# Patient Record
Sex: Female | Born: 1960 | Race: Black or African American | Hispanic: No | Marital: Married | Smoking: Never smoker
Health system: Southern US, Community
[De-identification: ages and names within clinical notes are randomized; demographics above are authoritative.]

## PROBLEM LIST (undated history)

## (undated) DIAGNOSIS — Z95810 Presence of automatic (implantable) cardiac defibrillator: Secondary | ICD-10-CM

## (undated) DIAGNOSIS — I442 Atrioventricular block, complete: Secondary | ICD-10-CM

## (undated) DIAGNOSIS — I428 Other cardiomyopathies: Secondary | ICD-10-CM

## (undated) DIAGNOSIS — K635 Polyp of colon: Secondary | ICD-10-CM

## (undated) DIAGNOSIS — N83202 Unspecified ovarian cyst, left side: Secondary | ICD-10-CM

## (undated) DIAGNOSIS — D251 Intramural leiomyoma of uterus: Secondary | ICD-10-CM

## (undated) DIAGNOSIS — N83201 Unspecified ovarian cyst, right side: Secondary | ICD-10-CM

## (undated) DIAGNOSIS — I5022 Chronic systolic (congestive) heart failure: Secondary | ICD-10-CM

## (undated) DIAGNOSIS — I10 Essential (primary) hypertension: Secondary | ICD-10-CM

## (undated) HISTORY — DX: Polyp of colon: K63.5

## (undated) HISTORY — DX: Unspecified ovarian cyst, right side: N83.201

## (undated) HISTORY — DX: Intramural leiomyoma of uterus: D25.1

## (undated) HISTORY — DX: Essential (primary) hypertension: I10

## (undated) HISTORY — DX: Unspecified ovarian cyst, left side: N83.202

---

## 1996-03-09 HISTORY — PX: MYOMECTOMY: SHX85

## 2002-03-09 HISTORY — PX: ENDOMETRIAL ABLATION: SHX621

## 2003-04-03 ENCOUNTER — Other Ambulatory Visit: Admission: RE | Admit: 2003-04-03 | Discharge: 2003-04-03 | Payer: Self-pay | Admitting: Family Medicine

## 2003-07-17 ENCOUNTER — Encounter: Admission: RE | Admit: 2003-07-17 | Discharge: 2003-07-17 | Payer: Self-pay | Admitting: Family Medicine

## 2004-07-16 ENCOUNTER — Ambulatory Visit (HOSPITAL_COMMUNITY): Admission: RE | Admit: 2004-07-16 | Discharge: 2004-07-16 | Payer: Self-pay | Admitting: Family Medicine

## 2005-04-21 ENCOUNTER — Other Ambulatory Visit: Admission: RE | Admit: 2005-04-21 | Discharge: 2005-04-21 | Payer: Self-pay | Admitting: Family Medicine

## 2005-07-27 ENCOUNTER — Ambulatory Visit (HOSPITAL_COMMUNITY): Admission: RE | Admit: 2005-07-27 | Discharge: 2005-07-27 | Payer: Self-pay | Admitting: Family Medicine

## 2006-05-20 ENCOUNTER — Other Ambulatory Visit: Admission: RE | Admit: 2006-05-20 | Discharge: 2006-05-20 | Payer: Self-pay | Admitting: Family Medicine

## 2006-11-23 ENCOUNTER — Ambulatory Visit (HOSPITAL_COMMUNITY): Admission: RE | Admit: 2006-11-23 | Discharge: 2006-11-23 | Payer: Self-pay | Admitting: Family Medicine

## 2007-07-11 ENCOUNTER — Other Ambulatory Visit: Admission: RE | Admit: 2007-07-11 | Discharge: 2007-07-11 | Payer: Self-pay | Admitting: Family Medicine

## 2007-12-08 ENCOUNTER — Encounter: Admission: RE | Admit: 2007-12-08 | Discharge: 2007-12-08 | Payer: Self-pay | Admitting: Family Medicine

## 2007-12-22 ENCOUNTER — Encounter: Admission: RE | Admit: 2007-12-22 | Discharge: 2007-12-22 | Payer: Self-pay | Admitting: Family Medicine

## 2008-03-09 HISTORY — PX: COLONOSCOPY: SHX174

## 2008-12-31 ENCOUNTER — Other Ambulatory Visit: Admission: RE | Admit: 2008-12-31 | Discharge: 2008-12-31 | Payer: Self-pay | Admitting: Family Medicine

## 2009-01-28 ENCOUNTER — Encounter: Payer: Self-pay | Admitting: Family Medicine

## 2009-05-01 ENCOUNTER — Ambulatory Visit: Payer: Self-pay | Admitting: Family Medicine

## 2009-05-01 DIAGNOSIS — N83209 Unspecified ovarian cyst, unspecified side: Secondary | ICD-10-CM | POA: Insufficient documentation

## 2009-05-01 DIAGNOSIS — I10 Essential (primary) hypertension: Secondary | ICD-10-CM | POA: Insufficient documentation

## 2009-05-01 DIAGNOSIS — D649 Anemia, unspecified: Secondary | ICD-10-CM | POA: Insufficient documentation

## 2009-05-01 DIAGNOSIS — L910 Hypertrophic scar: Secondary | ICD-10-CM | POA: Insufficient documentation

## 2009-05-20 ENCOUNTER — Ambulatory Visit: Payer: Self-pay | Admitting: Family Medicine

## 2009-05-20 DIAGNOSIS — M79609 Pain in unspecified limb: Secondary | ICD-10-CM | POA: Insufficient documentation

## 2009-11-20 ENCOUNTER — Ambulatory Visit: Payer: Self-pay | Admitting: Family Medicine

## 2009-11-20 ENCOUNTER — Telehealth: Payer: Self-pay | Admitting: Family Medicine

## 2009-11-20 DIAGNOSIS — M7502 Adhesive capsulitis of left shoulder: Secondary | ICD-10-CM | POA: Insufficient documentation

## 2010-01-03 ENCOUNTER — Ambulatory Visit: Payer: Self-pay | Admitting: Family Medicine

## 2010-01-03 DIAGNOSIS — IMO0002 Reserved for concepts with insufficient information to code with codable children: Secondary | ICD-10-CM | POA: Insufficient documentation

## 2010-03-20 ENCOUNTER — Telehealth: Payer: Self-pay | Admitting: Family Medicine

## 2010-03-29 ENCOUNTER — Encounter: Payer: Self-pay | Admitting: Family Medicine

## 2010-03-30 ENCOUNTER — Encounter: Payer: Self-pay | Admitting: Family Medicine

## 2010-04-08 NOTE — Assessment & Plan Note (Signed)
Summary: toe pain/dm   Vital Signs:  Patient profile:   50 year old female Weight:      154 pounds O2 Sat:      97 % Temp:     98.5 degrees F Pulse rate:   57 / minute BP sitting:   130 / 84  (left arm) Cuff size:   regular  Vitals Entered By: Pura Spice, RN (November 20, 2009 11:45 AM) CC: hit rt 4 th toe last nite.   History of Present Illness: Here for an injury to the right 4th toe which occurred at home last night. She struck the toe ona bed post. Today it throbs and is swollen. She did go in to work. She has done nothing for it so far.   Allergies (verified): No Known Drug Allergies  Past History:  Past Medical History: Reviewed history from 05/01/2009 and no changes required. Anemia-NOS Colonic polyps, hx of Hypertension Chickenpox uterine fibroids ovarian cysts, sees Dr. Maxie Better normal DEXA 2008  Past Surgical History: Reviewed history from 05/01/2009 and no changes required. uterine myomectomy 1998 endometrial ablation 2004 colonoscopy 2010, benign polyps  Review of Systems  The patient denies anorexia, fever, weight loss, weight gain, vision loss, decreased hearing, hoarseness, chest pain, syncope, dyspnea on exertion, peripheral edema, prolonged cough, headaches, hemoptysis, abdominal pain, melena, hematochezia, severe indigestion/heartburn, hematuria, incontinence, genital sores, muscle weakness, suspicious skin lesions, transient blindness, difficulty walking, depression, unusual weight change, abnormal bleeding, enlarged lymph nodes, angioedema, breast masses, and testicular masses.    Physical Exam  General:  Well-developed,well-nourished,in no acute distress; alert,appropriate and cooperative throughout examination Extremities:  the right 4th toe is swolen, ecchymotic, and tender. Full ROM. Good alignment. No crepitus.    Impression & Recommendations:  Problem # 1:  FRACTURE, TOE (ICD-826.0)  Complete Medication List: 1)  Ziac  2.5-6.25 Mg Tabs (Bisoprolol-hydrochlorothiazide) .... Once daily 2)  Felodipine 10 Mg Xr24h-tab (Felodipine) .... Once daily 3)  Ferrous Sulfate 325 (65 Fe) Mg Tabs (Ferrous sulfate) .... Two times a day  Other Orders: Radiology Referral (Radiology) T-Toe(s) [phone])  Patient Instructions: 1)  We will send her for Xrays this afternoon. Elevate the foot today and apply ice packs to it. Use Motrin as needed . Buddy wrap it with the 3rd toe for support.   Appended Document: Orders Update    Clinical Lists Changes  Orders: Added new Test order of T-Toe(s) ([id number]) - Signed

## 2010-04-08 NOTE — Progress Notes (Signed)
Summary: xray results  Phone Note Call from Patient Call back at [phone]   Reason for Call: Lab or Test Results Summary of Call: Xray toe.   Initial call taken by: Rudy Jew, RN,  November 20, 2009 4:03 PM  Follow-up for Phone Call        Pt called to check on status of xray results. Follow-up by: Lucy Antigua,  November 20, 2009 4:15 PM  Additional Follow-up for Phone Call Additional follow up Details #1::        pt notifed  Additional Follow-up by: Pura Spice, RN,  November 20, 2009 5:06 PM

## 2010-04-08 NOTE — Assessment & Plan Note (Signed)
Summary: upper leg pain//ccm   Vital Signs:  Patient profile:   50 year old female Weight:      157 pounds O2 Sat:      98 % Temp:     98.8 degrees F Pulse rate:   64 / minute BP sitting:   120 / 84  (left arm) Cuff size:   regular  Vitals Entered By: Pura Spice, RN (January 03, 2010 10:19 AM) CC: states walked in high heel shoes for 2 days back -back c/o pain left groin area into thigh    History of Present Illness: Here for 3 days of sharp severe pains in the left groin area. No recent trauma but she has  been doing [employer]. No abdominal symptoms.   Contraindications/Deferment of Procedures/Staging:    Test/Procedure: FLU VAX    Reason for deferment: patient declined   Allergies (verified): No Known Drug Allergies  Past History:  Past Medical History: Reviewed history from 05/01/2009 and no changes required. Anemia-NOS Colonic polyps, hx of Hypertension Chickenpox uterine fibroids ovarian cysts, sees Dr. Maxie Better normal DEXA 2008  Past Surgical History: Reviewed history from 05/01/2009 and no changes required. uterine myomectomy 1998 endometrial ablation 2004 colonoscopy 2010, benign polyps  Review of Systems  The patient denies anorexia, fever, weight loss, weight gain, vision loss, decreased hearing, hoarseness, chest pain, syncope, dyspnea on exertion, peripheral edema, prolonged cough, headaches, hemoptysis, abdominal pain, melena, hematochezia, severe indigestion/heartburn, hematuria, incontinence, genital sores, muscle weakness, suspicious skin lesions, transient blindness, difficulty walking, depression, unusual weight change, abnormal bleeding, enlarged lymph nodes, angioedema, breast masses, and testicular masses.    Physical Exam  General:  walks with a slight limp Abdomen:  Bowel sounds positive,abdomen soft and non-tender without masses, organomegaly or hernias noted. Msk:  tender over the left anterior groin area, especially the  proximal hip flexors. Full ROM. No masses   Impression & Recommendations:  Problem # 1:  SPRAIN&STRAIN OTHER SPECIFIED SITES HIP&THIGH (ICD-843.8)  Complete Medication List: 1)  Ziac 2.5-6.25 Mg Tabs (Bisoprolol-hydrochlorothiazide) .... Once daily 2)  Felodipine 10 Mg Xr24h-tab (Felodipine) .... Once daily 3)  Ferrous Sulfate 325 (65 Fe) Mg Tabs (Ferrous sulfate) .... Two times a day  Patient Instructions: 1)  this is a hip flexor strain. rest, ice packs, Advil as needed . No workouts for at least 2 weeks. off work today until 03-09-09.    Orders Added: 1)  Est. Patient Level III [91478]

## 2010-04-08 NOTE — Assessment & Plan Note (Signed)
Summary: new pt ov//ccm/pt had a conflict/cjr   Vital Signs:  Patient profile:   50 year old female Height:      64 inches Weight:      155 pounds BMI:     26.70 Temp:     98.1 degrees F oral Pulse rate:   82 / minute BP sitting:   114 / 84  (left arm) Cuff size:   regular  Vitals Entered By: Alfred Levins, CMA (May 01, 2009 1:31 PM) CC: establish   History of Present Illness: 50 yr old female to establish with [place] after transfering from Dr. Maurice Small. She feels well in general but has a few questions. She had a cpx with labs last month, and her Hgb was low as usual. She has been anemic all her life. She thinks it was 10 or so. She was told to take OTC iron pills, but she would prefer to take prescription iron pills.   Preventive Screening-Counseling & Management  Alcohol-Tobacco     Smoking Status: never  Caffeine-Diet-Exercise     Does Patient Exercise: no      Drug Use:  no.    Allergies (verified): No Known Drug Allergies  Past History:  Past Medical History: Anemia-NOS Colonic polyps, hx of Hypertension Chickenpox uterine fibroids ovarian cysts, sees Dr. Maxie Better normal DEXA 2008  Past Surgical History: uterine myomectomy 1998 endometrial ablation 2004 colonoscopy 2010, benign polyps  Family History: Reviewed history and no changes required. Family History of Arthritis Family History of CAD Female 1st degree relative <50 Family History Depression Family History Diabetes 1st degree relative Family History Hypertension Family History of Stroke M 1st degree relative <50  Social History: Reviewed history and no changes required. Married Never Smoked Alcohol use-yes Drug use-no Regular exercise-no Smoking Status:  never Drug Use:  no Does Patient Exercise:  no  Review of Systems  The patient denies anorexia, fever, weight loss, weight gain, vision loss, decreased hearing, hoarseness, chest pain, syncope, dyspnea on exertion,  peripheral edema, prolonged cough, headaches, hemoptysis, abdominal pain, melena, hematochezia, severe indigestion/heartburn, hematuria, incontinence, genital sores, muscle weakness, suspicious skin lesions, transient blindness, difficulty walking, depression, unusual weight change, abnormal bleeding, enlarged lymph nodes, angioedema, breast masses, and testicular masses.    Physical Exam  General:  overweight-appearing.   Neck:  No deformities, masses, or tenderness noted. Lungs:  Normal respiratory effort, chest expands symmetrically. Lungs are clear to auscultation, no crackles or wheezes. Heart:  Normal rate and regular rhythm. S1 and S2 normal without gallop, click, rub or other extra sounds. She has a soft 2/6 SM loudest in the aortic area. Extremities:  no edema   Impression & Recommendations:  Problem # 1:  HYPERTENSION (ICD-401.9)  The following medications were removed from the medication list:    Ziac 10-6.25 Mg Tabs (Bisoprolol-hydrochlorothiazide) .Marland Kitchen... 1 by mouth once daily Her updated medication list for this problem includes:    Ziac 2.5-6.25 Mg Tabs (Bisoprolol-hydrochlorothiazide) ..... Once daily    Felodipine 10 Mg Xr24h-tab (Felodipine) ..... Once daily  Problem # 2:  ANEMIA-NOS (ICD-285.9)  Her updated medication list for this problem includes:    Ferrous Sulfate 325 (65 Fe) Mg Tabs (Ferrous sulfate) .Marland Kitchen..Marland Kitchen Two times a day  Problem # 3:  COLONIC POLYPS, HX OF (ICD-V12.72)  Problem # 4:  OVARIAN CYST (ICD-620.2)  Complete Medication List: 1)  Ziac 2.5-6.25 Mg Tabs (Bisoprolol-hydrochlorothiazide) .... Once daily 2)  Felodipine 10 Mg Xr24h-tab (Felodipine) .... Once daily 3)  Ferrous Sulfate  325 (65 Fe) Mg Tabs (Ferrous sulfate) .... Two times a day  Patient Instructions: 1)  I wrote to get back on prescription iron pills daily. Get old records Prescriptions: FERROUS SULFATE 325 (65 FE) MG TABS (FERROUS SULFATE) two times a day  #60 x 11   Entered and  Authorized by:   Nelwyn Salisbury MD   Signed by:   Nelwyn Salisbury MD on 05/01/2009   Method used:   [facility]        [facility] [address] [phone]* (retail)       [address]       [place], [place]  [address]       Ph: [phone] or [phone]       Fax: [phone]   RxID:   [id number]

## 2010-04-08 NOTE — Assessment & Plan Note (Signed)
Summary: consult re toe issues//ccm/pt rsc/cjr   Vital Signs:  Patient profile:   50 year old female Weight:      154 pounds BMI:     26.53 Temp:     97.8 degrees F oral BP sitting:   140 / 90  (left arm) Cuff size:   regular  Vitals Entered By: Raechel Ache, RN (May 20, 2009 9:07 AM) CC: C/o sore L pinky toe; feels better today.   History of Present Illness: here for recurrent pains in both 5th toes. This has been going on for about 2 years. The toes never swell or get warm, but they may get red at times. No other toes are involved, and her feet do not bother her. She tries to wear broad toed shoes to avoid constricting her toes. She never takes any meds for them. Around her house she either goes barefoot or wears slippers. The toes hurt all last week, but today they are back to normal.   Allergies: No Known Drug Allergies  Past History:  Past Medical History: Reviewed history from 05/01/2009 and no changes required. Anemia-NOS Colonic polyps, hx of Hypertension Chickenpox uterine fibroids ovarian cysts, sees Dr. Maxie Better normal DEXA 2008  Past Surgical History: Reviewed history from 05/01/2009 and no changes required. uterine myomectomy 1998 endometrial ablation 2004 colonoscopy 2010, benign polyps  Review of Systems  The patient denies anorexia, fever, weight loss, weight gain, vision loss, decreased hearing, hoarseness, chest pain, syncope, dyspnea on exertion, peripheral edema, prolonged cough, headaches, hemoptysis, abdominal pain, melena, hematochezia, severe indigestion/heartburn, hematuria, incontinence, genital sores, muscle weakness, suspicious skin lesions, transient blindness, difficulty walking, depression, unusual weight change, abnormal bleeding, enlarged lymph nodes, angioedema, breast masses, and testicular masses.    Physical Exam  General:  Well-developed,well-nourished,in no acute distress; alert,appropriate and cooperative throughout  examination Extremities:  both feet and all 10 toes are normal, no tenderness or redness at all   Impression & Recommendations:  Problem # 1:  TOE PAIN (ICD-729.5)  Complete Medication List: 1)  Ziac 2.5-6.25 Mg Tabs (Bisoprolol-hydrochlorothiazide) .... Once daily 2)  Felodipine 10 Mg Xr24h-tab (Felodipine) .... Once daily 3)  Ferrous Sulfate 325 (65 Fe) Mg Tabs (Ferrous sulfate) .... Two times a day  Patient Instructions: 1)  Since her exam is normal today, it is difficult to say exactly why the toes hurt at  times. i imagine that wearing tight or pointy toed shoes would contribute to this problem. Advised her to wear wide toed shoes, use Advil or Aleeve as needed , and see me next time when they are hurting so I can get a better idea of what the cause is.

## 2010-04-08 NOTE — Letter (Signed)
Summary: Records from [facility] 2005 - 2010  Records from [facility] 2005 - 2010   Imported By: Maryln Gottron 05/16/2009 14:47:24  _____________________________________________________________________  External Attachment:    Type:   Image     Comment:   External Document

## 2010-04-10 NOTE — Progress Notes (Signed)
Summary: refill  Phone Note Call from Patient Call back at [phone]   Caller: Patient--live call Summary of Call: plendil and ziac [facility]. Initial call taken by: Warnell Forester,  March 20, 2010 2:52 PM  Follow-up for Phone Call        call in one year of each  Follow-up by: Nelwyn Salisbury MD,  March 21, 2010 10:49 AM  Additional Follow-up for Phone Call Additional follow up Details #1::        done  Additional Follow-up by: Pura Spice, RN,  March 21, 2010 11:37 AM    Prescriptions: FELODIPINE 10 MG XR24H-TAB (FELODIPINE) once daily  #30 x 11   Entered by:   Pura Spice, RN   Authorized by:   Nelwyn Salisbury MD   Signed by:   Pura Spice, RN on 03/21/2010   Method used:   [facility]        [facility] [address] [phone]* (retail)       [address]       [place], [place]  [address]       Ph: [phone] or [phone]       Fax: [phone]   RxID:   [id number] ZIAC 2.5-6.25 MG TABS (BISOPROLOL-HYDROCHLOROTHIAZIDE) once daily  #30 x 11   Entered by:   Pura Spice, RN   Authorized by:   Nelwyn Salisbury MD   Signed by:   Pura Spice, RN on 03/21/2010   Method used:   [facility]        [facility] [address] [phone]* (retail)       [address]       [place], [place]  [address]       Ph: [phone] or [phone]       Fax: [phone]   RxID:   [id number]

## 2010-05-29 ENCOUNTER — Ambulatory Visit: Payer: Self-pay | Admitting: Family Medicine

## 2010-06-02 ENCOUNTER — Ambulatory Visit: Payer: Self-pay | Admitting: Family Medicine

## 2010-06-06 ENCOUNTER — Encounter: Payer: Self-pay | Admitting: Family Medicine

## 2010-06-06 ENCOUNTER — Ambulatory Visit (INDEPENDENT_AMBULATORY_CARE_PROVIDER_SITE_OTHER): Payer: BC Managed Care – PPO | Admitting: Family Medicine

## 2010-06-06 VITALS — BP 112/78 | HR 70 | Temp 98.4°F | Wt 152.0 lb

## 2010-06-06 DIAGNOSIS — M549 Dorsalgia, unspecified: Secondary | ICD-10-CM

## 2010-06-06 LAB — POCT URINALYSIS DIPSTICK
Bilirubin, UA: NEGATIVE
Ketones, UA: NEGATIVE
Leukocytes, UA: NEGATIVE
Nitrite, UA: NEGATIVE
Protein, UA: NEGATIVE
Spec Grav, UA: 1.02

## 2010-06-06 MED ORDER — PREDNISONE (PAK) 10 MG PO TABS: ORAL_TABLET | ORAL | Status: DC

## 2010-06-06 NOTE — Progress Notes (Signed)
  Subjective:    Patient ID: Joanne Bell, female    DOB: 07-25-1960, 50 y.o.   MRN: [id number]  HPI Here to follow up on a lower back strain which occurred about 2 weeks ago, apparently from working in her garden. She has stiffness and pain in the left lower back. No pain in the legs. Using heat. She went to Urgent Care last weekend and had normal lumbar Xrays. She was given Vicodin and Flexeril, but she has not improved much this week. No urinary symptoms.   Review of Systems  Constitutional: Negative.   Gastrointestinal: Negative.   Genitourinary: Negative.   Musculoskeletal: Positive for back pain.       Objective:   Physical Exam  Constitutional: She appears well-developed and well-nourished.  Abdominal: Soft. Bowel sounds are normal. There is no tenderness.  Musculoskeletal:       Mildly tender along the left lower back with mild spasms. Full ROM and negative SLR          Assessment & Plan:  I think the one thing missing is something to reduce the inflammation of the soft tissues of the back. Add a steroid taper pack.

## 2010-06-09 ENCOUNTER — Telehealth: Payer: Self-pay | Admitting: *Deleted

## 2010-06-09 NOTE — Telephone Encounter (Addendum)
Duplicate mess .  Per pt request note with today date faxed and informed pt per dr fry recommendations diagnosis was not put on the note.

## 2010-06-09 NOTE — Telephone Encounter (Signed)
We can change the date on the note, but I strongly suggest she does NOT put a diagnosis on it

## 2010-06-09 NOTE — Telephone Encounter (Signed)
Note to return to work was wrong.  Should be today, and should diagnosis on it, please.  Fax to [phone]

## 2010-06-26 ENCOUNTER — Encounter: Payer: Self-pay | Admitting: Family Medicine

## 2010-06-26 ENCOUNTER — Ambulatory Visit (INDEPENDENT_AMBULATORY_CARE_PROVIDER_SITE_OTHER): Payer: BC Managed Care – PPO | Admitting: Family Medicine

## 2010-06-26 VITALS — BP 120/80 | HR 89 | Wt 154.0 lb

## 2010-06-26 DIAGNOSIS — S335XXA Sprain of ligaments of lumbar spine, initial encounter: Secondary | ICD-10-CM

## 2010-06-26 DIAGNOSIS — S39012A Strain of muscle, fascia and tendon of lower back, initial encounter: Secondary | ICD-10-CM

## 2010-06-26 NOTE — Progress Notes (Signed)
  Subjective:    Patient ID: Joanne Bell, female    DOB: 1960-11-08, 50 y.o.   MRN: [id number]  HPI Here to follow up on a lower back strain that started 4 weeks ago. She has  Had normal lumbar Xrays, and she completed a course of Prednisone one week ago. The pain is much better, but she still gets some spasms and stiffness in the left lower back at times. Taking Motrin.    Review of Systems  Constitutional: Negative.   Musculoskeletal: Positive for back pain.       Objective:   Physical Exam  Constitutional: She appears well-developed and well-nourished.  Musculoskeletal:       Mildly tender in the loeft lower back. Full ROM           Assessment & Plan:  Try some PT for several weeks.

## 2010-07-02 ENCOUNTER — Ambulatory Visit: Payer: BC Managed Care – PPO

## 2010-08-06 ENCOUNTER — Encounter: Payer: Self-pay | Admitting: Family Medicine

## 2010-08-06 ENCOUNTER — Ambulatory Visit (INDEPENDENT_AMBULATORY_CARE_PROVIDER_SITE_OTHER): Payer: BC Managed Care – PPO | Admitting: Family Medicine

## 2010-08-06 VITALS — BP 118/72 | HR 77 | Temp 98.5°F | Resp 14 | Ht 64.75 in | Wt 154.0 lb

## 2010-08-06 DIAGNOSIS — Z136 Encounter for screening for cardiovascular disorders: Secondary | ICD-10-CM

## 2010-08-06 DIAGNOSIS — Z0000 Encounter for general adult medical examination without abnormal findings: Secondary | ICD-10-CM

## 2010-08-06 LAB — CBC WITH DIFFERENTIAL/PLATELET
Basophils Relative: 0.3 % (ref 0.0–3.0)
Eosinophils Relative: 0.7 % (ref 0.0–5.0)
HCT: 32.6 % — ABNORMAL LOW (ref 36.0–46.0)
Hemoglobin: 10.9 g/dL — ABNORMAL LOW (ref 12.0–15.0)
MCHC: 33.3 g/dL (ref 30.0–36.0)
Monocytes Relative: 8.4 % (ref 3.0–12.0)
Neutro Abs: 2.3 10*3/uL (ref 1.4–7.7)

## 2010-08-06 LAB — POCT URINALYSIS DIPSTICK
Bilirubin, UA: NEGATIVE
Glucose, UA: NEGATIVE
Leukocytes, UA: NEGATIVE
Nitrite, UA: NEGATIVE
Spec Grav, UA: 1.03

## 2010-08-06 LAB — HEPATIC FUNCTION PANEL: Bilirubin, Direct: 0.1 mg/dL (ref 0.0–0.3)

## 2010-08-06 LAB — LIPID PANEL
Cholesterol: 168 mg/dL (ref 0–200)
LDL Cholesterol: 89 mg/dL (ref 0–99)
Triglycerides: 160 mg/dL — ABNORMAL HIGH (ref 0.0–149.0)

## 2010-08-06 LAB — BASIC METABOLIC PANEL
Calcium: 9.1 mg/dL (ref 8.4–10.5)
GFR: 104.34 mL/min (ref >60.00)
Glucose, Bld: 85 mg/dL (ref 70–99)
Potassium: 4.2 mEq/L (ref 3.5–5.1)

## 2010-08-06 MED ORDER — FERROUS SULFATE 325 (65 FE) MG PO TABS: 325.0000 mg | ORAL_TABLET | Freq: Two times a day (BID) | ORAL | Status: DC

## 2010-08-06 MED ORDER — BISOPROLOL-HYDROCHLOROTHIAZIDE 2.5-6.25 MG PO TABS: 1.0000 | ORAL_TABLET | Freq: Every day | ORAL | Status: DC

## 2010-08-06 MED ORDER — FELODIPINE ER 10 MG PO TB24: 10.0000 mg | ORAL_TABLET | Freq: Every day | ORAL | Status: DC

## 2010-08-06 NOTE — Progress Notes (Signed)
  Subjective:    Patient ID: Joanne Bell, female    DOB: Sep 21, 1960, 50 y.o.   MRN: [id number]  HPI 50 yr old female for a cpx. She feels great with no complaints. Her back pain has resolved.   Review of Systems  Constitutional: Negative.   HENT: Negative.   Eyes: Negative.   Respiratory: Negative.   Cardiovascular: Negative.   Gastrointestinal: Negative.   Genitourinary: Negative for dysuria, urgency, frequency, hematuria, flank pain, decreased urine volume, enuresis, difficulty urinating, pelvic pain and dyspareunia.  Musculoskeletal: Negative.   Skin: Negative.   Neurological: Negative.   Hematological: Negative.   Psychiatric/Behavioral: Negative.        Objective:   Physical Exam  Constitutional: She is oriented to person, place, and time. She appears well-developed and well-nourished. No distress.  HENT:  Head: Normocephalic and atraumatic.  Right Ear: External ear normal.  Left Ear: External ear normal.  Nose: Nose normal.  Mouth/Throat: Oropharynx is clear and moist. No oropharyngeal exudate.  Eyes: Conjunctivae and EOM are normal. Pupils are equal, round, and reactive to light. No scleral icterus.  Neck: Normal range of motion. Neck supple. No JVD present. No thyromegaly present.  Cardiovascular: Normal rate, regular rhythm, normal heart sounds and intact distal pulses.  Exam reveals no gallop and no friction rub.   No murmur heard.      EKG normal   Pulmonary/Chest: Effort normal and breath sounds normal. No respiratory distress. She has no wheezes. She has no rales. She exhibits no tenderness.  Abdominal: Soft. Bowel sounds are normal. She exhibits no distension and no mass. There is no tenderness. There is no rebound and no guarding.  Musculoskeletal: Normal range of motion. She exhibits no edema and no tenderness.  Lymphadenopathy:    She has no cervical adenopathy.  Neurological: She is alert and oriented to person, place, and time. She has normal reflexes.  No cranial nerve deficit. She exhibits normal muscle tone. Coordination normal.  Skin: Skin is warm and dry. No rash noted. No erythema.  Psychiatric: She has a normal mood and affect. Her behavior is normal. Judgment and thought content normal.          Assessment & Plan:  Get fasting labs today

## 2010-08-12 NOTE — Progress Notes (Signed)
Call placed to patient at [phone], no answer. A detailed voice message was left informing patient per Dr Claris Che instructions

## 2010-12-15 ENCOUNTER — Encounter: Payer: Self-pay | Admitting: Family Medicine

## 2010-12-15 ENCOUNTER — Ambulatory Visit (INDEPENDENT_AMBULATORY_CARE_PROVIDER_SITE_OTHER): Payer: BC Managed Care – PPO | Admitting: Family Medicine

## 2010-12-15 VITALS — BP 124/72 | HR 69 | Temp 98.5°F | Wt 149.0 lb

## 2010-12-15 DIAGNOSIS — R0989 Other specified symptoms and signs involving the circulatory and respiratory systems: Secondary | ICD-10-CM

## 2010-12-15 DIAGNOSIS — K589 Irritable bowel syndrome without diarrhea: Secondary | ICD-10-CM

## 2010-12-15 DIAGNOSIS — R0609 Other forms of dyspnea: Secondary | ICD-10-CM

## 2010-12-15 DIAGNOSIS — R0683 Snoring: Secondary | ICD-10-CM

## 2010-12-15 MED ORDER — FLUTICASONE PROPIONATE 50 MCG/ACT NA SUSP: 2.0000 | Freq: Every day | NASAL | Status: DC

## 2010-12-15 MED ORDER — DICYCLOMINE HCL 20 MG PO TABS: 20.0000 mg | ORAL_TABLET | Freq: Three times a day (TID) | ORAL | Status: DC | PRN

## 2010-12-15 NOTE — Progress Notes (Signed)
  Subjective:    Patient ID: Joanne Bell, female    DOB: [birth year], 50 y.o.   MRN: [id number]  HPI Here to discuss intermittent epigastric pains with nausea and diarrhea. These seem to be triggered by certain meals, and sometimes they come in the middle of the night. No heartburn or fever. Avoiding spicy foods and not eating late at night has helped. She uses a lot of Pepto Bismol. Also she has started snoring over the past few months. She also notices a lot of nasal congestion recently.    Review of Systems  Constitutional: Negative.   HENT: Positive for congestion.   Respiratory: Negative.   Cardiovascular: Negative.   Gastrointestinal: Positive for nausea, abdominal pain and diarrhea. Negative for vomiting, constipation, blood in stool, abdominal distention and anal bleeding.       Objective:   Physical Exam  Constitutional: She appears well-developed and well-nourished.  HENT:  Right Ear: External ear normal.  Left Ear: External ear normal.  Nose: Nose normal.  Neck: No thyromegaly present.  Abdominal: Soft. Bowel sounds are normal. She exhibits no distension and no mass. There is no tenderness. There is no rebound and no guarding.  Lymphadenopathy:    She has no cervical adenopathy.          Assessment & Plan:  Recheck prn

## 2011-02-10 ENCOUNTER — Telehealth: Payer: Self-pay | Admitting: Family Medicine

## 2011-02-10 NOTE — Telephone Encounter (Signed)
Pt states this is a new occurrence. When pt walks a short distance or up a few steps her chest tightens and she has to sit down and get a breath.  Pt has noticed off and on for the last 2 months.  According to pt today and yesterday it has been all day.  Pt aware of appt on 02/11/11 at 8:45 am.

## 2011-02-10 NOTE — Telephone Encounter (Signed)
Pt called and said that she has been having breathing diff while walking for the last few wks,which has worsened over the last few days. Pt is req call back from nurse.

## 2011-02-11 ENCOUNTER — Encounter: Payer: Self-pay | Admitting: Family Medicine

## 2011-02-11 ENCOUNTER — Ambulatory Visit (INDEPENDENT_AMBULATORY_CARE_PROVIDER_SITE_OTHER): Payer: BC Managed Care – PPO | Admitting: Family Medicine

## 2011-02-11 VITALS — BP 128/72 | HR 43 | Temp 98.6°F | Wt 152.0 lb

## 2011-02-11 DIAGNOSIS — R0602 Shortness of breath: Secondary | ICD-10-CM

## 2011-02-11 DIAGNOSIS — D649 Anemia, unspecified: Secondary | ICD-10-CM

## 2011-02-11 DIAGNOSIS — I498 Other specified cardiac arrhythmias: Secondary | ICD-10-CM

## 2011-02-11 DIAGNOSIS — R001 Bradycardia, unspecified: Secondary | ICD-10-CM

## 2011-02-11 LAB — POCT HEMOGLOBIN: Hemoglobin: 11.4

## 2011-02-11 MED ORDER — LOSARTAN POTASSIUM-HCTZ 100-25 MG PO TABS: 1.0000 | ORAL_TABLET | Freq: Every day | ORAL | Status: DC

## 2011-02-11 NOTE — Progress Notes (Signed)
  Subjective:    Patient ID: Joanne Bell, female    DOB: [birth year], 50 y.o.   MRN: [id number]  HPI Here for several months of SOB which she gets with minimal exertion. Now simply walking one hundred yards or going up a flight of steps causes her to have to stop to catch her breath. She often gets an achy pain in her thighs when this happens, and this quickly resolves. No chest pain or nausea or sweats. At her cpx in may of this year her EKG was normal except for sinus bradycardia in the 50s. She has chronic anemia, and her Hgb then was 10.9.     Review of Systems  Constitutional: Negative.   Respiratory: Positive for chest tightness and shortness of breath. Negative for cough and choking.   Cardiovascular: Negative.        Objective:   Physical Exam  Constitutional: She appears well-developed and well-nourished.  Neck: No thyromegaly present.  Cardiovascular: Regular rhythm, normal heart sounds and intact distal pulses.  Exam reveals no gallop and no friction rub.   No murmur heard.      Slow rate. EKG shows sinus bradycardia in the 30s   Pulmonary/Chest: Effort normal and breath sounds normal. No respiratory distress. She has no wheezes. She has no rales. She exhibits no tenderness.  Lymphadenopathy:    She has no cervical adenopathy.          Assessment & Plan:  Her SOB on exertion appears to be due to severe bradycardia. We will refer to Cardiology ASAP and we will take her off her meds containing beta blockers and calcium channel blockers. Switch her BP meds to losartan HCT.

## 2011-02-13 ENCOUNTER — Ambulatory Visit (INDEPENDENT_AMBULATORY_CARE_PROVIDER_SITE_OTHER): Payer: BC Managed Care – PPO | Admitting: Cardiology

## 2011-02-13 ENCOUNTER — Ambulatory Visit (INDEPENDENT_AMBULATORY_CARE_PROVIDER_SITE_OTHER)
Admission: RE | Admit: 2011-02-13 | Discharge: 2011-02-13 | Disposition: A | Discharge status: Home / Self Care | Payer: BC Managed Care – PPO | Source: Ambulatory Visit | Attending: Cardiology | Admitting: Cardiology

## 2011-02-13 ENCOUNTER — Encounter: Payer: Self-pay | Admitting: *Deleted

## 2011-02-13 ENCOUNTER — Encounter: Payer: Self-pay | Admitting: Cardiology

## 2011-02-13 ENCOUNTER — Encounter (HOSPITAL_COMMUNITY): Payer: Self-pay | Admitting: Pharmacy Technician

## 2011-02-13 VITALS — BP 184/82 | HR 36 | Ht 65.0 in | Wt 155.0 lb

## 2011-02-13 DIAGNOSIS — I442 Atrioventricular block, complete: Secondary | ICD-10-CM

## 2011-02-13 DIAGNOSIS — Z01810 Encounter for preprocedural cardiovascular examination: Secondary | ICD-10-CM

## 2011-02-13 LAB — CBC WITH DIFFERENTIAL/PLATELET
Basophils Absolute: 0 10*3/uL (ref 0.0–0.1)
Basophils Relative: 0 % (ref 0–1)
Eosinophils Relative: 1 % (ref 0–5)
HCT: 36 % (ref 36.0–46.0)
Lymphocytes Relative: 52 % — ABNORMAL HIGH (ref 12–46)
MCHC: 31.7 g/dL (ref 30.0–36.0)
Monocytes Absolute: 0.4 10*3/uL (ref 0.1–1.0)
Neutro Abs: 2.3 10*3/uL (ref 1.7–7.7)
Platelets: 325 10*3/uL (ref 150–400)
RDW: 13.7 % (ref 11.5–15.5)
WBC: 5.7 10*3/uL (ref 4.0–10.5)

## 2011-02-13 LAB — MAGNESIUM: Magnesium: 2 mg/dL (ref 1.5–2.5)

## 2011-02-13 LAB — BASIC METABOLIC PANEL
BUN: 12 mg/dL (ref 6–23)
Creat: 0.79 mg/dL (ref 0.50–1.10)
Potassium: 4.2 mEq/L (ref 3.5–5.3)

## 2011-02-13 LAB — TSH: TSH: 3.508 u[IU]/mL (ref 0.350–4.500)

## 2011-02-13 LAB — PROTIME-INR: INR: 0.95 (ref <1.50)

## 2011-02-13 MED ORDER — DIAZEPAM 5 MG PO TABS
5.0000 mg | ORAL_TABLET | Freq: Four times a day (QID) | ORAL | Status: AC | PRN
Start: 2011-02-13 — End: 2011-02-23

## 2011-02-13 NOTE — Assessment & Plan Note (Addendum)
Patient presents with progressive dyspnea on exertion and complete heart block. She had been on a beta blocker but has not taken this in the past 2 days and her heart block persists. This is the most likely cause of her dyspnea. I have reviewed with Dr. Graciela Husbands and we will proceed with pacemaker on Monday. Note she has not had syncope. We will arrange for a cardiac MRI Monday morning to exclude infiltrative cardiomyopathies causing conduction abnormalities. I will also arrange an echocardiogram. We will schedule baseline laboratories to include a TSH. Chest x ray to screen for sarcoid.  I spoke with the patient personally reviewing the history, electrocardiograms, evaluation for underlying primary causes and the likely need for pacemaker implantation. We discussed the potential benefits as well as risks including but not limited to infection perforation of heart lung lead dislodgment and his device malfunction she understands his risks and is willing to proceed.

## 2011-02-13 NOTE — Progress Notes (Signed)
HPI: 50 yo female with no prior cardiac history for evaluation of dyspnea and bradycardia. Patient states that for the past 3 months she has had increased dyspnea on exertion. This has increased in the past several weeks. There is no orthopnea, PND, pedal edema, palpitations, chest pain or syncope. She was seen on December 5 by her primary care physician and her bisoprolol was discontinued. She has continued to have dyspnea on exertion and we were asked to further evaluate.  Current Outpatient Prescriptions  Medication Sig Dispense Refill  . ferrous sulfate 325 (65 FE) MG tablet Take 1 tablet (325 mg total) by mouth 2 (two) times daily.  60 tablet  11  . fluticasone (FLONASE) 50 MCG/ACT nasal spray Place 2 sprays into the nose daily.  16 g  11  . losartan-hydrochlorothiazide (HYZAAR) 100-25 MG per tablet Take 1 tablet by mouth daily.  30 tablet  11  . Multiple Vitamins-Minerals (MULTIVITAMIN,TX-MINERALS) tablet Take 1 tablet by mouth daily.          No Known Allergies  Past Medical History  Diagnosis Date  . Anemia   . Colon polyps   . Hypertension   . Fibroids, intramural   . Bilateral ovarian cysts   . Gynecological examination     sees Dr. Sheronette Cousins    Past Surgical History  Procedure Date  . Myomectomy 1998    per Dr. Cousins  . Endometrial ablation 2004  . Colonoscopy 2010    benign polyps, repeat in 5 yrs    History   Social History  . Marital Status: Single    Spouse Name: N/A    Number of Children: 2  . Years of Education: N/A   Occupational History  .      Secretary   Social History Main Topics  . Smoking status: Never Smoker   . Smokeless tobacco: Never Used  . Alcohol Use: Yes     Rarely  . Drug Use: No  . Sexually Active: Not on file   Other Topics Concern  . Not on file   Social History Narrative  . No narrative on file    Family History  Problem Relation Age of Onset  . Depression Mother   . Arthritis Father   . Diabetes Father     . Hyperlipidemia Father   . Hypertension Father   . Stroke Father     ROS: no fevers or chills, productive cough, hemoptysis, dysphasia, odynophagia, melena, hematochezia, dysuria, hematuria, rash, seizure activity, orthopnea, PND, pedal edema, claudication. Remaining systems are negative.  Physical Exam:   Blood pressure 184/82, pulse 36, height 5' 5" (1.651 m), weight 155 lb (70.308 kg).  General:  Well developed/well nourished in NAD Skin warm/dry Patient not depressed No peripheral clubbing Back-normal HEENT-normal/normal eyelids Neck supple/normal carotid upstroke bilaterally; no bruits; no JVD; no thyromegaly chest - CTA/ normal expansion CV -Bradycardic/normal S1 and S2; no  rubs or gallops;  PMI nondisplaced; 1/6 systolic murmur. 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  

## 2011-02-13 NOTE — Assessment & Plan Note (Signed)
Her blood pressure is elevated but she has not taken her blood pressure patient today. She will begin losartan HCT as prescribed by primary care. We will increase as needed. Avoid AV nodal blocking agents for now.

## 2011-02-13 NOTE — Patient Instructions (Addendum)
Your physician recommends that you schedule a follow-up appointment in: AFTER DEVICE IMPLANT WILL SCHEDULE FROM HOSPITAL Your physician recommends that you continue on your current medications as directed. Please refer to the Current Medication list given to you today. Your physician recommends that you return for lab work in: TODAY STAT BMET CBC  MAG  PT  PTT TSH  DX BRADYCARDIA V72.81 Your physician has requested that you have an echocardiogram. Echocardiography is a painless test that uses sound waves to create images of your heart. It provides your doctor with information about the size and shape of your heart and how well your heart's chambers and valves are working. This procedure takes approximately one hour. There are no restrictions for this procedure.02-16-11 MON AT 10:30 AM AT [address] OFFICE  CARDIAC MRI AT [place]   MON 02-16-11 BE AT RADIOLOGY AT 7:45 AM   A chest x-ray takes a picture of the organs and structures inside the chest, including the heart, lungs, and blood vessels. This test can show several things, including, whether the heart is enlarges; whether fluid is building up in the lungs; and whether pacemaker / defibrillator leads are still in place.TODAY AT [facility] OFFICE   DX V72.81

## 2011-02-13 NOTE — Progress Notes (Signed)
Addended by: Duke Salvia on: 02/13/2011 07:03 PM   Modules accepted: Orders

## 2011-02-15 MED ORDER — DIAZEPAM 5 MG PO TABS
5.0000 mg | ORAL_TABLET | ORAL | Status: AC
  Administered 2011-02-16: 5 mg via ORAL

## 2011-02-15 MED ORDER — CHLORHEXIDINE GLUCONATE 4 % EX LIQD
60.0000 mL | Freq: Once | CUTANEOUS | Status: AC
  Administered 2011-02-16: 4 via TOPICAL
  Filled 2011-02-15: qty 60

## 2011-02-15 MED ORDER — CEFAZOLIN SODIUM 1-5 GM-% IV SOLN
1.0000 g | INTRAVENOUS | Status: AC
  Filled 2011-02-15: qty 50

## 2011-02-15 MED ORDER — SODIUM CHLORIDE 0.9 % IR SOLN
80.0000 mg | Status: AC
  Filled 2011-02-15: qty 2

## 2011-02-16 ENCOUNTER — Encounter (HOSPITAL_COMMUNITY)
Admission: RE | Disposition: A | Discharge status: Home / Self Care | Payer: Self-pay | Source: Ambulatory Visit | Attending: Internal Medicine

## 2011-02-16 ENCOUNTER — Ambulatory Visit (HOSPITAL_COMMUNITY)
Admission: RE | Admit: 2011-02-16 | Discharge: 2011-02-17 | Disposition: A | Discharge status: Home / Self Care | Payer: BC Managed Care – PPO | Source: Ambulatory Visit | Attending: Internal Medicine | Admitting: Internal Medicine

## 2011-02-16 ENCOUNTER — Ambulatory Visit (HOSPITAL_COMMUNITY)
Admission: RE | Admit: 2011-02-16 | Discharge: 2011-02-16 | Disposition: A | Discharge status: Home / Self Care | Payer: BC Managed Care – PPO | Source: Ambulatory Visit | Attending: Cardiology | Admitting: Cardiology

## 2011-02-16 ENCOUNTER — Ambulatory Visit (HOSPITAL_BASED_OUTPATIENT_CLINIC_OR_DEPARTMENT_OTHER): Payer: BC Managed Care – PPO | Admitting: Radiology

## 2011-02-16 DIAGNOSIS — I10 Essential (primary) hypertension: Secondary | ICD-10-CM | POA: Insufficient documentation

## 2011-02-16 DIAGNOSIS — I441 Atrioventricular block, second degree: Secondary | ICD-10-CM | POA: Insufficient documentation

## 2011-02-16 DIAGNOSIS — Z01810 Encounter for preprocedural cardiovascular examination: Secondary | ICD-10-CM

## 2011-02-16 DIAGNOSIS — I442 Atrioventricular block, complete: Secondary | ICD-10-CM

## 2011-02-16 DIAGNOSIS — R0602 Shortness of breath: Secondary | ICD-10-CM

## 2011-02-16 HISTORY — PX: PERMANENT PACEMAKER INSERTION: SHX5480

## 2011-02-16 HISTORY — DX: Atrioventricular block, complete: I44.2

## 2011-02-16 LAB — SURGICAL PCR SCREEN: MRSA, PCR: NEGATIVE

## 2011-02-16 SURGERY — PERMANENT PACEMAKER INSERTION
Anesthesia: LOCAL | Laterality: Left

## 2011-02-16 MED ORDER — DIAZEPAM 5 MG PO TABS
5.0000 mg | ORAL_TABLET | Freq: Four times a day (QID) | ORAL | Status: DC | PRN
Start: 2011-02-16 — End: 2011-02-17
  Filled 2011-02-16: qty 1

## 2011-02-16 MED ORDER — FENTANYL CITRATE 0.05 MG/ML IJ SOLN
INTRAMUSCULAR | Status: AC
  Filled 2011-02-16: qty 2

## 2011-02-16 MED ORDER — LIDOCAINE HCL (PF) 1 % IJ SOLN
INTRAMUSCULAR | Status: AC
  Filled 2011-02-16: qty 60

## 2011-02-16 MED ORDER — FERROUS SULFATE 325 (65 FE) MG PO TABS
325.0000 mg | ORAL_TABLET | Freq: Two times a day (BID) | ORAL | Status: DC
  Administered 2011-02-17: 325 mg via ORAL
  Filled 2011-02-16 (×3): qty 1

## 2011-02-16 MED ORDER — CEFAZOLIN SODIUM 1-5 GM-% IV SOLN
INTRAVENOUS | Status: AC
  Administered 2011-02-16: 1 g via INTRAVENOUS
  Filled 2011-02-16: qty 50

## 2011-02-16 MED ORDER — TRAMADOL HCL 50 MG PO TABS
50.0000 mg | ORAL_TABLET | Freq: Two times a day (BID) | ORAL | Status: DC | PRN
  Administered 2011-02-16: 50 mg via ORAL
  Filled 2011-02-16: qty 1

## 2011-02-16 MED ORDER — HYDRALAZINE HCL 20 MG/ML IJ SOLN
INTRAMUSCULAR | Status: AC
  Filled 2011-02-16: qty 1

## 2011-02-16 MED ORDER — MUPIROCIN 2 % EX OINT
TOPICAL_OINTMENT | CUTANEOUS | Status: AC
  Administered 2011-02-16: 13:00:00
  Filled 2011-02-16: qty 22

## 2011-02-16 MED ORDER — LOSARTAN POTASSIUM 50 MG PO TABS
100.0000 mg | ORAL_TABLET | Freq: Every day | ORAL | Status: DC
  Administered 2011-02-16 – 2011-02-17 (×2): 100 mg via ORAL
  Filled 2011-02-16 (×2): qty 2

## 2011-02-16 MED ORDER — LOSARTAN POTASSIUM-HCTZ 100-25 MG PO TABS: 1.0000 | ORAL_TABLET | Freq: Every day | ORAL | Status: DC

## 2011-02-16 MED ORDER — HYDROCHLOROTHIAZIDE 25 MG PO TABS
25.0000 mg | ORAL_TABLET | Freq: Every day | ORAL | Status: DC
  Administered 2011-02-16 – 2011-02-17 (×2): 25 mg via ORAL
  Filled 2011-02-16 (×2): qty 1

## 2011-02-16 MED ORDER — ONDANSETRON HCL 4 MG/2ML IJ SOLN
4.0000 mg | Freq: Four times a day (QID) | INTRAMUSCULAR | Status: DC | PRN
  Administered 2011-02-16: 4 mg via INTRAVENOUS

## 2011-02-16 MED ORDER — GADOBENATE DIMEGLUMINE 529 MG/ML IV SOLN
20.0000 mL | Freq: Once | INTRAVENOUS | Status: AC
  Administered 2011-02-16: 20 mL via INTRAVENOUS

## 2011-02-16 MED ORDER — SODIUM CHLORIDE 0.9 % IV SOLN
INTRAVENOUS | Status: AC
  Administered 2011-02-16: 20:00:00 via INTRAVENOUS

## 2011-02-16 MED ORDER — CEFAZOLIN SODIUM 1-5 GM-% IV SOLN
1.0000 g | Freq: Four times a day (QID) | INTRAVENOUS | Status: AC
  Administered 2011-02-16 – 2011-02-17 (×3): 1 g via INTRAVENOUS
  Filled 2011-02-16 (×3): qty 50

## 2011-02-16 MED ORDER — SODIUM CHLORIDE 0.9 % IV SOLN
INTRAVENOUS | Status: DC
  Administered 2011-02-16: 13:00:00 via INTRAVENOUS

## 2011-02-16 MED ORDER — SODIUM CHLORIDE 0.9 % IV SOLN: 250.0000 mL | INTRAVENOUS | Status: DC | PRN

## 2011-02-16 MED ORDER — SODIUM CHLORIDE 0.45 % IV SOLN: INTRAVENOUS | Status: DC

## 2011-02-16 MED ORDER — HEPARIN (PORCINE) IN NACL 2-0.9 UNIT/ML-% IJ SOLN
INTRAMUSCULAR | Status: AC
  Filled 2011-02-16: qty 1000

## 2011-02-16 MED ORDER — SODIUM CHLORIDE 0.9 % IJ SOLN
3.0000 mL | Freq: Two times a day (BID) | INTRAMUSCULAR | Status: DC
  Administered 2011-02-17: 3 mL via INTRAVENOUS

## 2011-02-16 MED ORDER — MIDAZOLAM HCL 5 MG/5ML IJ SOLN
INTRAMUSCULAR | Status: AC
  Filled 2011-02-16: qty 5

## 2011-02-16 MED ORDER — SODIUM CHLORIDE 0.9 % IJ SOLN
3.0000 mL | INTRAMUSCULAR | Status: DC | PRN
  Administered 2011-02-17: 3 mL via INTRAVENOUS

## 2011-02-16 MED ORDER — ACETAMINOPHEN 500 MG PO TABS
500.0000 mg | ORAL_TABLET | Freq: Four times a day (QID) | ORAL | Status: DC
  Administered 2011-02-16 – 2011-02-17 (×3): 500 mg via ORAL
  Filled 2011-02-16 (×4): qty 1

## 2011-02-16 NOTE — H&P (View-Only) (Signed)
HPI: 50 yo female with no prior cardiac history for evaluation of dyspnea and bradycardia. Patient states that for the past 3 months she has had increased dyspnea on exertion. This has increased in the past several weeks. There is no orthopnea, PND, pedal edema, palpitations, chest pain or syncope. She was seen on December 5 by her primary care physician and her bisoprolol was discontinued. She has continued to have dyspnea on exertion and we were asked to further evaluate.  Current Outpatient Prescriptions  Medication Sig Dispense Refill  . ferrous sulfate 325 (65 FE) MG tablet Take 1 tablet (325 mg total) by mouth 2 (two) times daily.  60 tablet  11  . fluticasone (FLONASE) 50 MCG/ACT nasal spray Place 2 sprays into the nose daily.  16 g  11  . losartan-hydrochlorothiazide (HYZAAR) 100-25 MG per tablet Take 1 tablet by mouth daily.  30 tablet  11  . Multiple Vitamins-Minerals (MULTIVITAMIN,TX-MINERALS) tablet Take 1 tablet by mouth daily.          No Known Allergies  Past Medical History  Diagnosis Date  . Anemia   . Colon polyps   . Hypertension   . Fibroids, intramural   . Bilateral ovarian cysts   . Gynecological examination     sees Dr. Maxie Better    Past Surgical History  Procedure Date  . Myomectomy 1998    per Dr. Cherly Hensen  . Endometrial ablation 2004  . Colonoscopy 2010    benign polyps, repeat in 5 yrs    History   Social History  . Marital Status: Single    Spouse Name: N/A    Number of Children: 2  . Years of Education: N/A   Occupational History  .      Secretary   Social History Main Topics  . Smoking status: Never Smoker   . Smokeless tobacco: Never Used  . Alcohol Use: Yes     Rarely  . Drug Use: No  . Sexually Active: Not on file   Other Topics Concern  . Not on file   Social History Narrative  . No narrative on file    Family History  Problem Relation Age of Onset  . Depression Mother   . Arthritis Father   . Diabetes Father     . Hyperlipidemia Father   . Hypertension Father   . Stroke Father     ROS: no fevers or chills, productive cough, hemoptysis, dysphasia, odynophagia, melena, hematochezia, dysuria, hematuria, rash, seizure activity, orthopnea, PND, pedal edema, claudication. Remaining systems are negative.  Physical Exam:   Blood pressure 184/82, pulse 36, height 5\' 5"  (1.651 m), weight 155 lb (70.308 kg).  General:  Well developed/well nourished in NAD Skin warm/dry Patient not depressed No peripheral clubbing Back-normal HEENT-normal/normal eyelids Neck supple/normal carotid upstroke bilaterally; no bruits; no JVD; no thyromegaly chest - CTA/ normal expansion CV -Bradycardic/normal S1 and S2; no  rubs or gallops;  PMI nondisplaced; 1/6 systolic murmur. 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

## 2011-02-16 NOTE — Brief Op Note (Signed)
02/16/2011  6:00 PM  PATIENT:  Joanne Bell  50 y.o. 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 Hemostasis was obtained.  After this, we turned our attention to gaining accessm to the extrathoracic,left subclavian vein. This was accomplished without difficulty and without the aspiration of air or puncture of the artery. 2 separate venipunctures were accomplished; guidewires were placed and retained and sequentially 7 French sheath were placed which were passed a Medtronic Revo MRI compatible ventricular lead serial number [id number] V lead and an Medtronic Revo serial number [id number] V atrial lead.  The ventricular lead was manipulated to the right ventricular apex with a bipolar R wave was 12.1, the pacing impedance was 1106, the threshold was 0.7 V at 0.5 ms .  Current at threshold was   1.2 MA and the current of injury was brisk.  The right atrial lead was manipulated to the right atrial appendage  with a bipolar P-wave was 5.6, the pacing impedance was 991, the threshold 0.7 and 0.5.  Current at threshold was 1.2 MA and the current of injury was brisk.  The ventricular lead was marked with a tie prior to the insertion of the atrial lead. The leads were affixed to the prepectoral fascia and attached to a Medtronic REVO  pulse generator  serial number [id number].  Hemostasis was obtained. The pocket was copiously irrigated with antibiotic containing saline  solution. The leads and the pulse generator were placed in the pocket and affixed to the prepectoral fascia. The wound is then closed in 3 layers in normal fashion. The wound is washed dried and a benzoin Steri-Strip this was applied the account sponge counts and instrument counts were correct at the end of the procedure .Marland Kitchen The patient tolerated the procedure without apparent complication.  Gerlene Burdock.D.

## 2011-02-16 NOTE — Interval H&P Note (Signed)
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 After consideration of risks, benefits and other options for treatment, the patient has consented to  Procedure(s): PERMANENT PACEMAKER INSERTION as a surgical intervention .  The patients' history has been reviewed, patient examined, no change in status, stable for surgery.  I have reviewed the patients' chart and labs.  Questions were answered to the patient's satisfaction.    I had seen the patient on Friday and my note was addended to Dr  Waunita Schooner note  Sherryl Manges

## 2011-02-17 ENCOUNTER — Encounter (HOSPITAL_COMMUNITY): Payer: Self-pay | Admitting: Internal Medicine

## 2011-02-17 ENCOUNTER — Ambulatory Visit (HOSPITAL_COMMUNITY): Payer: BC Managed Care – PPO

## 2011-02-17 ENCOUNTER — Other Ambulatory Visit: Payer: Self-pay

## 2011-02-17 DIAGNOSIS — Z950 Presence of cardiac pacemaker: Secondary | ICD-10-CM | POA: Insufficient documentation

## 2011-02-17 DIAGNOSIS — I442 Atrioventricular block, complete: Secondary | ICD-10-CM | POA: Insufficient documentation

## 2011-02-17 MED ORDER — DOXYCYCLINE HYCLATE 100 MG PO TABS: 100.0000 mg | ORAL_TABLET | Freq: Two times a day (BID) | ORAL | Status: AC

## 2011-02-17 MED FILL — Diazepam Tab 5 MG: ORAL | Qty: 1 | Status: AC

## 2011-02-17 NOTE — Progress Notes (Addendum)
Patient Name: Joanne Bell      SUBJECTIVE:some shoulder sorenss no chest pain  Past Medical History  Diagnosis Date  . Atrioventricular block, complete     narrow QRS  . Hypertension   . Fibroids, intramural   . Bilateral ovarian cysts   . Colon polyps   . Pacemaker     Medtronic REVO    PHYSICAL EXAM Filed Vitals:   02/16/11 1225 02/16/11 2143 02/17/11 0553  BP: 110/72 128/78 130/82  Pulse: 37 97 77  Temp: 97.2 F (36.2 C) 98.6 F (37 C) 97.8 F (36.6 C)  TempSrc: Oral Oral Oral  Resp: 18 18 18   Height: 5\' 4"  (1.626 m)    Weight: 155 lb (70.308 kg)    SpO2: 96% 97% 99%    General appearance: alert, cooperative and no distress Lungs: clear to auscultation bilaterally Heart: regular rate and rhythm, S1, S2 normal, no murmur, click, rub or gallop Abdomen: soft, non-tender; bowel sounds normal; no masses,  no organomegaly Extremities: extremities normal, atraumatic, no cyanosis or edema Neurologic: Alert and oriented X 3, normal strength and tone. Normal symmetric reflexes. Normal coordination and gait  TELEMETRY: Reviewed telemetry pt in nsr with p synchronous pacing:    Intake/Output Summary (Last 24 hours) at 02/17/11 0800 Last data filed at 02/16/11 2300  Gross per 24 hour  Intake    172 ml  Output    300 ml  Net   -128 ml    LABS: Basic Metabolic Panel:  Lab 02/13/11 1610  NA 140  K 4.2  CL 103  CO2 22  GLUCOSE 73  BUN 12  CREATININE 0.79  CALCIUM 9.6  MG 2.0  PHOS --   Cardiac Enzymes: No results found for this basename: CKTOTAL:3,CKMB:3,CKMBINDEX:3,TROPONINI:3 in the last 72 hours CBC:  Lab 02/13/11 1542 02/11/11 1003  WBC 5.7 --  NEUTROABS 2.3 --  HGB 11.4* 11.4  HCT 36.0 --  MCV 85.3 --  PLT 325 --     Patient Active Hospital Problem List: HYPERTENSION (05/01/2009)   Assessment: will need followup as periprocedural prssure 200+   Plan: continue benicar/hct Atrioventricular block, complete ()   Assessment: s/p pacer; MRI  and ECHO unrevealing   Plan: lyme titers today; in the event that this is LYME will rx for one month with doxycycline 100 bid X 4 weeks    Signed, Sherryl Manges MD  02/17/2011

## 2011-02-17 NOTE — Progress Notes (Signed)
Pt being discharged home with husband.  Verbalizes understanding all DC instructions.  F/U appointments in place, prescriptions given.

## 2011-02-20 LAB — B. BURGDORFI ANTIBODIES BY WB: B burgdorferi IgM Abs (IB): NEGATIVE

## 2011-02-26 ENCOUNTER — Ambulatory Visit (INDEPENDENT_AMBULATORY_CARE_PROVIDER_SITE_OTHER): Payer: BC Managed Care – PPO | Admitting: *Deleted

## 2011-02-26 ENCOUNTER — Encounter: Payer: Self-pay | Admitting: Internal Medicine

## 2011-02-26 DIAGNOSIS — I442 Atrioventricular block, complete: Secondary | ICD-10-CM

## 2011-02-26 LAB — PACEMAKER DEVICE OBSERVATION
AL AMPLITUDE: 3.2 mv
ATRIAL PACING PM: 14.6
VENTRICULAR PACING PM: 100

## 2011-02-26 NOTE — Progress Notes (Signed)
Wound check-PPM 

## 2011-03-17 ENCOUNTER — Ambulatory Visit (INDEPENDENT_AMBULATORY_CARE_PROVIDER_SITE_OTHER): Payer: BC Managed Care – PPO | Admitting: Family Medicine

## 2011-03-17 ENCOUNTER — Encounter: Payer: Self-pay | Admitting: Family Medicine

## 2011-03-17 VITALS — BP 150/96 | HR 103 | Temp 98.3°F | Wt 152.0 lb

## 2011-03-17 DIAGNOSIS — I442 Atrioventricular block, complete: Secondary | ICD-10-CM

## 2011-03-17 DIAGNOSIS — I10 Essential (primary) hypertension: Secondary | ICD-10-CM

## 2011-03-17 MED ORDER — NEBIVOLOL HCL 5 MG PO TABS: 5.0000 mg | ORAL_TABLET | Freq: Every day | ORAL | Status: DC

## 2011-03-17 NOTE — Progress Notes (Signed)
  Subjective:    Patient ID: Joanne Bell, female    DOB: Feb 24, 1961, 51 y.o.   MRN: [id number]  HPI Here to ask about her BP. 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 She had a dual chamber pacemaker placed and Dr. Graciela Husbands has been monitoring this closely. She has done well with no SOB or chest pain or syncope since then. Her BP has remained a bit high however. She is still on Hyzaar daily.    Review of Systems  Constitutional: Negative.   Respiratory: Negative.   Cardiovascular: Negative.        Objective:   Physical Exam  Constitutional: She appears well-developed and well-nourished.  Neck: No thyromegaly present.  Cardiovascular: Normal rate, regular rhythm, normal heart sounds and intact distal pulses.   Pulmonary/Chest: Effort normal and breath sounds normal.  Lymphadenopathy:    She has no cervical adenopathy.          Assessment & Plan:  Add Bystolic 5 mg daily. Recheck in 3 weeks

## 2011-03-18 ENCOUNTER — Telehealth: Payer: Self-pay | Admitting: Family Medicine

## 2011-03-18 NOTE — Telephone Encounter (Signed)
Pt was in yesterday and received an RX for nebivolol (BYSTOLIC) 5 MG tablet  Pt said that it is to expensive for her to be getting monthly and would like to see if there is a different medication that she would be able to take in place of this that might be cheaper  Please contact pt

## 2011-03-18 NOTE — Telephone Encounter (Signed)
Left voice message.

## 2011-03-18 NOTE — Telephone Encounter (Signed)
I suggest she see her cardiologist about this since we need to be careful about what meds she takes

## 2011-03-19 ENCOUNTER — Telehealth: Payer: Self-pay | Admitting: Cardiology

## 2011-03-19 MED ORDER — METOPROLOL TARTRATE 25 MG PO TABS: 25.0000 mg | ORAL_TABLET | Freq: Two times a day (BID) | ORAL | Status: DC

## 2011-03-19 NOTE — Telephone Encounter (Signed)
Spoke with pt, aware of dr crenshaw's recommendations. 

## 2011-03-19 NOTE — Telephone Encounter (Signed)
Spoke with pt, she is taking the hyzaar. Dr Abran Cantor recently added bystolic and she has not taken it because she could not afford. She would need a generic. Will forward for dr Jens Som review

## 2011-03-19 NOTE — Telephone Encounter (Signed)
Dc bystolic and begin metoprolol 25 mg po BID Joanne Bell

## 2011-03-19 NOTE — Telephone Encounter (Signed)
New msg Pt said since December 8 her bp has been 150-172/93-101 and her BP med is not working. Please call her back

## 2011-04-22 ENCOUNTER — Telehealth: Payer: Self-pay | Admitting: Internal Medicine

## 2011-04-22 NOTE — Telephone Encounter (Signed)
I left a message for the patient to call. 

## 2011-04-22 NOTE — Telephone Encounter (Signed)
New msg Pt called and said her BP 158/98-160/90 and she wants to talk to you about what she can do about head cold. Please call her back

## 2011-04-23 NOTE — Telephone Encounter (Signed)
Mrs Iacovelli has a cough and congestion and wants to know what she can take.  I told her not to take any decongestants.  I told her she can take antihistamines and mucinex and robitussin but to make sure that they are plain and do not contain any decongestants.

## 2011-04-23 NOTE — Telephone Encounter (Signed)
Joanne Bell is concerned about her bp being elevated.  She states it is running 150-170/90-100.  She refuses to follow the recommendations of the on-call physician and states she wants to wait for Dr Graciela Husbands to address her bp issue.

## 2011-04-23 NOTE — Telephone Encounter (Signed)
FU Call: Pt returning call to our office. Please return pt call.  

## 2011-04-27 ENCOUNTER — Ambulatory Visit: Payer: BC Managed Care – PPO | Admitting: Family Medicine

## 2011-04-27 NOTE — Telephone Encounter (Signed)
N/A.  LMTC at both numbers.

## 2011-04-27 NOTE — Telephone Encounter (Signed)
Lets try changing her metoprolol to labetolol 200 bid  Thanks steve

## 2011-04-28 ENCOUNTER — Ambulatory Visit (INDEPENDENT_AMBULATORY_CARE_PROVIDER_SITE_OTHER): Payer: BC Managed Care – PPO | Admitting: Family Medicine

## 2011-04-28 ENCOUNTER — Encounter: Payer: Self-pay | Admitting: Family Medicine

## 2011-04-28 VITALS — BP 120/80 | HR 67 | Temp 98.5°F | Wt 150.0 lb

## 2011-04-28 DIAGNOSIS — J329 Chronic sinusitis, unspecified: Secondary | ICD-10-CM

## 2011-04-28 MED ORDER — AZITHROMYCIN 250 MG PO TABS: ORAL_TABLET | ORAL | Status: AC

## 2011-04-28 NOTE — Progress Notes (Signed)
  Subjective:    Patient ID: Joanne Bell, female    DOB: [birth year], 51 y.o.   MRN: [id number]  HPI Here for 10 days of sinus pressure, PND, ST, and coughing up green sputum. No fever.    Review of Systems  Constitutional: Negative.   HENT: Positive for congestion, postnasal drip and sinus pressure.   Eyes: Negative.   Respiratory: Positive for cough.        Objective:   Physical Exam  Constitutional: She appears well-developed and well-nourished.  HENT:  Right Ear: External ear normal.  Nose: Nose normal.  Mouth/Throat: Oropharynx is clear and moist. No oropharyngeal exudate.  Eyes: Conjunctivae are normal.  Pulmonary/Chest: Effort normal and breath sounds normal.  Lymphadenopathy:    She has no cervical adenopathy.          Assessment & Plan:  Add Mucinex

## 2011-04-28 NOTE — Telephone Encounter (Signed)
I left a message for the patient to call at both contact numbers.

## 2011-04-29 ENCOUNTER — Telehealth: Payer: Self-pay | Admitting: Internal Medicine

## 2011-04-29 NOTE — Telephone Encounter (Signed)
FU Call: Pt returning call to [facility] from yesterday. Please return pt call to discuss further.

## 2011-04-29 NOTE — Telephone Encounter (Signed)
I spoke with the patient about Dr. Odessa Fleming recommendations to d/c metoprolol and start labetolol 200 mg bid. After speaking with the patient, she explained to me that she saw Dr. Clent Ridges yesterday and her bp was good. Her SBP was 120. I advised that she could continue her current meds and track her BP's as she has not done this this week. I reviewed her meds with her. She is listed as taking metoprolol tart 25 mg BID in our system. She explained that she was only taking this once daily. I called her pharmacy to verify what they have been giving her and they have been giving her metoprolol tart 25 mg BID. She is not understanding why she is on the meds she is on. She wanted to know why couldn't she go back on her old meds. I explained that they were d/c'ed prior to her PPM being placed. I reviewed with Dr. Graciela Husbands as her EF is normal. He explained her meds were for BP control. He called the patient and explained to her that her BP could be controlled with many different medications. He advised that she allow Dr. Clent Ridges to follow her BP. She was agreeable with this. No change to her medications were made at this time.

## 2011-05-27 ENCOUNTER — Encounter: Payer: BC Managed Care – PPO | Admitting: Internal Medicine

## 2011-06-01 ENCOUNTER — Ambulatory Visit (INDEPENDENT_AMBULATORY_CARE_PROVIDER_SITE_OTHER): Payer: BC Managed Care – PPO | Admitting: Internal Medicine

## 2011-06-01 ENCOUNTER — Ambulatory Visit: Payer: BC Managed Care – PPO | Admitting: Family Medicine

## 2011-06-01 ENCOUNTER — Encounter: Payer: Self-pay | Admitting: Internal Medicine

## 2011-06-01 VITALS — BP 118/88 | HR 64 | Ht 64.0 in | Wt 152.0 lb

## 2011-06-01 DIAGNOSIS — I10 Essential (primary) hypertension: Secondary | ICD-10-CM

## 2011-06-01 DIAGNOSIS — Z950 Presence of cardiac pacemaker: Secondary | ICD-10-CM

## 2011-06-01 DIAGNOSIS — I442 Atrioventricular block, complete: Secondary | ICD-10-CM

## 2011-06-01 LAB — PACEMAKER DEVICE OBSERVATION
AL AMPLITUDE: 3.4 mv
ATRIAL PACING PM: 6.7
RV LEAD IMPEDENCE PM: 496 Ohm
RV LEAD THRESHOLD: 1 V

## 2011-06-01 LAB — BASIC METABOLIC PANEL
CO2: 29 mEq/L (ref 19–32)
Calcium: 9.6 mg/dL (ref 8.4–10.5)
GFR: 102.43 mL/min (ref >60.00)
Sodium: 140 mEq/L (ref 135–145)

## 2011-06-01 NOTE — Progress Notes (Signed)
  HPI  Joanne Bell is a 50 y.o. female Seen in followup for a pacemaker implanted December 2012 for complete heart block  Her workup for underlying causes was negative including MRI echo Lyme titers and chest x-ray these were all normal.  She co fatigue, but relatively acute.  Past Medical History  Diagnosis Date  . Atrioventricular block, complete     narrow QRS  . Hypertension   . Fibroids, intramural   . Bilateral ovarian cysts   . Colon polyps   . Pacemaker     Medtronic REVO    Past Surgical History  Procedure Date  . Myomectomy 1998    per Dr. Cherly Hensen  . Endometrial ablation 2004  . Colonoscopy 2010    benign polyps, repeat in 5 yrs    Current Outpatient Prescriptions  Medication Sig Dispense Refill  . ferrous sulfate 325 (65 FE) MG tablet Take 1 tablet (325 mg total) by mouth 2 (two) times daily.  60 tablet  11  . losartan-hydrochlorothiazide (HYZAAR) 100-25 MG per tablet Take 1 tablet by mouth daily.  30 tablet  11  . metoprolol tartrate (LOPRESSOR) 25 MG tablet Take 25 mg by mouth daily.      . Multiple Vitamins-Minerals (MULTIVITAMIN,TX-MINERALS) tablet Take 1 tablet by mouth daily.        Carolin Coy Isoflavones (SOY BALANCE) 325-65 MG TABS Take by mouth.        No Known Allergies  Review of Systems negative except from HPI and PMH  Physical Exam BP 118/88  Pulse 64  Ht 5\' 4"  (1.626 m)  Wt 152 lb (68.947 kg)  BMI 26.09 kg/m2 Well developed and well nourished in no acute distress HENT normal E scleral and icterus clear Neck Supple JVP flat; carotids brisk and full Clear to ausculation Regular rate and rhythm, no murmurs gallops or rub Soft with active bowel sounds No clubbing cyanosis none Edema Alert and oriented, grossly normal motor and sensory function Skin Warm and Dry   Assessment and  Plan

## 2011-06-01 NOTE — Assessment & Plan Note (Signed)
Stable ost pacing 

## 2011-06-01 NOTE — Assessment & Plan Note (Signed)
Well controlled  Will hold lopressor  Which she is taking only once a day

## 2011-06-01 NOTE — Patient Instructions (Addendum)
Your physician wants you to follow-up in: 9 months with Dr Graciela Husbands.  You will receive a reminder letter in the mail two months in advance. If you don't receive a letter, please call our office to schedule the follow-up appointment.  Your physician has recommended you make the following change in your medication:  1) hold metoprolol for now to see how you feel.  Your physician recommends that you have lab work today: bmp

## 2011-06-01 NOTE — Assessment & Plan Note (Signed)
[name] device was interrogated and the information was fully reviewed.  The device was reprogrammed to maximize longevity  

## 2011-06-03 ENCOUNTER — Ambulatory Visit: Payer: BC Managed Care – PPO | Admitting: Family Medicine

## 2011-06-11 ENCOUNTER — Telehealth: Payer: Self-pay | Admitting: Internal Medicine

## 2011-06-11 DIAGNOSIS — I10 Essential (primary) hypertension: Secondary | ICD-10-CM

## 2011-06-11 DIAGNOSIS — Z79899 Other long term (current) drug therapy: Secondary | ICD-10-CM

## 2011-06-11 NOTE — Telephone Encounter (Signed)
Fu call °Pt returning your call  °

## 2011-06-11 NOTE — Telephone Encounter (Signed)
I spoke with the patient. 

## 2011-06-16 ENCOUNTER — Other Ambulatory Visit: Payer: Self-pay | Admitting: Obstetrics and Gynecology

## 2011-06-16 DIAGNOSIS — Z1231 Encounter for screening mammogram for malignant neoplasm of breast: Secondary | ICD-10-CM

## 2011-06-29 ENCOUNTER — Ambulatory Visit
Admission: RE | Admit: 2011-06-29 | Discharge: 2011-06-29 | Disposition: A | Discharge status: Home / Self Care | Payer: BC Managed Care – PPO | Source: Ambulatory Visit | Attending: Obstetrics and Gynecology | Admitting: Obstetrics and Gynecology

## 2011-06-29 DIAGNOSIS — Z1231 Encounter for screening mammogram for malignant neoplasm of breast: Secondary | ICD-10-CM

## 2011-07-02 ENCOUNTER — Other Ambulatory Visit (INDEPENDENT_AMBULATORY_CARE_PROVIDER_SITE_OTHER): Payer: BC Managed Care – PPO

## 2011-07-02 DIAGNOSIS — I10 Essential (primary) hypertension: Secondary | ICD-10-CM

## 2011-07-02 DIAGNOSIS — Z79899 Other long term (current) drug therapy: Secondary | ICD-10-CM

## 2011-07-02 LAB — BASIC METABOLIC PANEL
CO2: 29 mEq/L (ref 19–32)
Calcium: 9.1 mg/dL (ref 8.4–10.5)
Chloride: 102 mEq/L (ref 96–112)
Creatinine, Ser: 0.8 mg/dL (ref 0.4–1.2)
Glucose, Bld: 75 mg/dL (ref 70–99)

## 2011-07-03 ENCOUNTER — Other Ambulatory Visit: Payer: Self-pay | Admitting: *Deleted

## 2011-07-03 DIAGNOSIS — E876 Hypokalemia: Secondary | ICD-10-CM

## 2011-07-03 MED ORDER — LOSARTAN POTASSIUM 100 MG PO TABS: 100.0000 mg | ORAL_TABLET | Freq: Every day | ORAL | Status: DC

## 2011-07-20 ENCOUNTER — Other Ambulatory Visit: Payer: BC Managed Care – PPO

## 2011-07-23 ENCOUNTER — Other Ambulatory Visit (INDEPENDENT_AMBULATORY_CARE_PROVIDER_SITE_OTHER): Payer: BC Managed Care – PPO

## 2011-07-23 DIAGNOSIS — E876 Hypokalemia: Secondary | ICD-10-CM

## 2011-07-24 LAB — BASIC METABOLIC PANEL
BUN: 12 mg/dL (ref 6–23)
Creatinine, Ser: 0.8 mg/dL (ref 0.4–1.2)
GFR: 103.95 mL/min (ref >60.00)
Glucose, Bld: 91 mg/dL (ref 70–99)

## 2011-08-05 ENCOUNTER — Telehealth: Payer: Self-pay | Admitting: Internal Medicine

## 2011-08-05 NOTE — Telephone Encounter (Signed)
Patient is aware of lab work results. Patient said that the Lozartin 100 mg is making her drowsy all day. She thinks that this medication dose was increased when it was  changed  without the HCTZ. I  Left Patient a message to  let her know that the Lozartin dose did not changed.

## 2011-08-05 NOTE — Telephone Encounter (Signed)
New problem:  Test results.  

## 2011-08-05 NOTE — Telephone Encounter (Signed)
Returned call to her work/ left for day. MSG left on cell to return call to triage nurse to get results, number provided.

## 2011-08-21 ENCOUNTER — Telehealth: Payer: Self-pay

## 2011-08-21 NOTE — Telephone Encounter (Signed)
Pt walked in with concerns of elevated BP. She states that her BP taken with her home monitor has been running systolic 150's to 160's and dystolic has been above 100 x 2 weeks. Pt denies having experienced any dizziness, lightheadedness, SOB, pains, sweats or HA.  BP checked in office: 178/120. Pt not in any distress. She is concerned because she states that between PCP and Cardiology, her meds have been changed a few times.   Per Dr. Clent Ridges, have pt take an extra dose of metoprolol tonight, then begin taking 2 tabs of the metoprolol in the morning and every morning after, and schedule appt to see him next week.   Pt aware and verbalized understanding and appt scheduled

## 2011-08-25 ENCOUNTER — Ambulatory Visit (INDEPENDENT_AMBULATORY_CARE_PROVIDER_SITE_OTHER): Payer: BC Managed Care – PPO | Admitting: Family Medicine

## 2011-08-25 ENCOUNTER — Encounter: Payer: Self-pay | Admitting: Family Medicine

## 2011-08-25 VITALS — BP 156/100 | HR 96 | Temp 98.6°F | Wt 156.0 lb

## 2011-08-25 DIAGNOSIS — I10 Essential (primary) hypertension: Secondary | ICD-10-CM

## 2011-08-25 MED ORDER — NEBIVOLOL HCL 10 MG PO TABS: 10.0000 mg | ORAL_TABLET | Freq: Every day | ORAL | Status: DC

## 2011-08-25 NOTE — Progress Notes (Signed)
  Subjective:    Patient ID: Joanne Bell, female    DOB: 1960-07-26, 51 y.o.   MRN: [id number]  HPI Here to recheck HTN. She has been getting some high BP readings lately, as high as 170s systolic. She feels well except for some fatigue. She has doubled up on Metoprolol for several days and the BP is better but still high.    Review of Systems  Constitutional: Negative.   Respiratory: Negative.   Cardiovascular: Negative.        Objective:   Physical Exam  Constitutional: She appears well-developed and well-nourished.  Cardiovascular: Normal rate, regular rhythm, normal heart sounds and intact distal pulses.   Pulmonary/Chest: Effort normal and breath sounds normal.          Assessment & Plan:  Switch from metoprolol to Bystolic 10 mg a day. Recheck in 3-4 weeks.

## 2011-09-11 ENCOUNTER — Telehealth: Payer: Self-pay | Admitting: Internal Medicine

## 2011-09-11 NOTE — Telephone Encounter (Signed)
Pt having sharp pains in the scar area where pacemaker placed, pls advise [phone]

## 2011-09-11 NOTE — Telephone Encounter (Signed)
Reviewed with Dr. Graciela Husbands. As long as there is no redness or inflammation at the site, he is not worried about late onset infection. He states there could be some tethering at the site and we should probably have her come in one day next week to look at this in the device clinic. The patient states the site is not red. She has had discomfort off and on since her implant, but this seems more frequent. I reviewed the device schedule for next week and there are no open slots now. I explained to the patient I would forward to the device clinic to look at their schedule and call her next week for an appointment. She is agreeable.

## 2011-09-11 NOTE — Telephone Encounter (Signed)
Spoke with patient.  She state she occasionally has some "sticking type pains" around the device site.  She denies any redness, edema, fever or chills.  I offered her an appointment for next week but she declined at this point.  She does state that it seems to be developing some keloid.  I will relay this information to Dr. Graciela Husbands.  She will follow up as scheduled.

## 2011-09-15 ENCOUNTER — Telehealth: Payer: Self-pay | Admitting: Family Medicine

## 2011-09-15 NOTE — Telephone Encounter (Signed)
Caller: Celica/Patient; PCP: Nelwyn Salisbury.; CB#: [phone]; ; Call regarding BP Med Question; Current BP 164/91, ankle swelling after being on current med x 3 weeks (Losartan, Nebivolol).  Emergent sx ruled out.  See in 72 hours per HTN protocol. Scheduled for 0930 on 7/10 w/ Dr. Clent Ridges. Caller agreed.

## 2011-09-16 ENCOUNTER — Ambulatory Visit (INDEPENDENT_AMBULATORY_CARE_PROVIDER_SITE_OTHER): Payer: BC Managed Care – PPO | Admitting: Family Medicine

## 2011-09-16 ENCOUNTER — Encounter: Payer: Self-pay | Admitting: Family Medicine

## 2011-09-16 VITALS — BP 160/98 | HR 60 | Temp 98.1°F | Wt 155.0 lb

## 2011-09-16 DIAGNOSIS — I10 Essential (primary) hypertension: Secondary | ICD-10-CM

## 2011-09-16 MED ORDER — NEBIVOLOL HCL 10 MG PO TABS: 10.0000 mg | ORAL_TABLET | Freq: Every day | ORAL | Status: DC

## 2011-09-16 MED ORDER — AMLODIPINE BESYLATE 5 MG PO TABS: 5.0000 mg | ORAL_TABLET | Freq: Every day | ORAL | Status: DC

## 2011-09-16 NOTE — Progress Notes (Signed)
  Subjective:    Patient ID: Joanne Bell, female    DOB: 07-26-1960, 51 y.o.   MRN: [id number]  HPI Here to recheck HTN. She is taking Losartan and Bystolic daily. She feels fine, and her BP is better on some days, not so much on others. Her BP goes from 140 to 170 systolic.   Review of Systems  Constitutional: Negative.   Respiratory: Negative.   Cardiovascular: Negative.        Objective:   Physical Exam  Constitutional: She appears well-developed and well-nourished.  Neck: No thyromegaly present.  Cardiovascular: Normal rate, regular rhythm, normal heart sounds and intact distal pulses.   Pulmonary/Chest: Effort normal and breath sounds normal.  Lymphadenopathy:    She has no cervical adenopathy.          Assessment & Plan:  Add Norvasc 5 mg a day. Limit daily sodium intake to less then 2000 mg. Recheck 3 weeks

## 2011-09-18 ENCOUNTER — Telehealth: Payer: Self-pay | Admitting: Family Medicine

## 2011-09-18 NOTE — Telephone Encounter (Signed)
Samples are ready for pick up and I left a voice message for pt. 

## 2011-09-18 NOTE — Telephone Encounter (Signed)
Pt requesting samples of Bystolic 10mg 

## 2011-10-20 ENCOUNTER — Telehealth: Payer: Self-pay | Admitting: Family Medicine

## 2011-10-20 NOTE — Telephone Encounter (Signed)
Caller: [name]/Patient; Patient Name: Joanne Bell; PCP: Nelwyn Salisbury.; Best Callback Phone Number: [phone].   Patient requesting either samples or prescription of Bystolic med for BP.  Patient is out of sample. Asymtomatic.  Please follow up with Patient at [phone]. [facility] uses [facility] [place].

## 2011-10-20 NOTE — Telephone Encounter (Signed)
Samples ready for pick up

## 2011-11-17 ENCOUNTER — Telehealth: Payer: Self-pay | Admitting: Family Medicine

## 2011-11-17 NOTE — Telephone Encounter (Signed)
Caller: [name]/Patient; Patient Name: Joanne Bell; PCP: Gershon Crane [facility]); Best Callback Phone Number: [phone]; Reason for call: Medication questions.  States she is sure about her losartan and her bystolic, but has a question regarding her norvasc/amlodipine tabes.  States the numbers on the pills themselves look different than they have in the past.  States each tablet has  IG on one side and 238 on the other side, and wants to make sure they are indeed amlodipine.  Per https://waters.com/ website for generic Rx, this tablet is the [facility] version of amlodipine 5mg .  Patient advised; no further questions or concerns.

## 2011-11-23 ENCOUNTER — Telehealth: Payer: Self-pay | Admitting: Family Medicine

## 2011-11-23 NOTE — Telephone Encounter (Signed)
Caller: Indie/Patient; Patient Name: Joanne Bell; PCP: Gershon Crane [facility]); Best Callback Phone Number: [phone].  Patient requests samples of Bystolic for her blood pressure; reports she has only one tablet remaining for 11/24/11.   If no samples available, states she is not able to afford prescription, therefore no pharmacy information obtained.  Note to office for follow up.

## 2011-11-24 NOTE — Telephone Encounter (Signed)
I found some samples for 5 mg, so she can take two at a time

## 2011-11-24 NOTE — Telephone Encounter (Signed)
Samples are up front and I left a voice message with below information.

## 2012-01-18 ENCOUNTER — Other Ambulatory Visit: Payer: Self-pay | Admitting: Family Medicine

## 2012-02-01 ENCOUNTER — Telehealth: Payer: Self-pay | Admitting: Family Medicine

## 2012-02-01 NOTE — Telephone Encounter (Signed)
Samples are ready for pick up and I spoke with pt. 

## 2012-02-01 NOTE — Telephone Encounter (Signed)
Pt req to get samples of Bystolic ?10mg . Pls call.

## 2012-02-12 ENCOUNTER — Encounter: Payer: Self-pay | Admitting: *Deleted

## 2012-02-16 ENCOUNTER — Ambulatory Visit (INDEPENDENT_AMBULATORY_CARE_PROVIDER_SITE_OTHER): Payer: BC Managed Care – PPO | Admitting: Internal Medicine

## 2012-02-16 ENCOUNTER — Encounter: Payer: Self-pay | Admitting: Internal Medicine

## 2012-02-16 VITALS — BP 145/85 | HR 60 | Resp 18 | Ht 64.0 in | Wt 161.2 lb

## 2012-02-16 DIAGNOSIS — L910 Hypertrophic scar: Secondary | ICD-10-CM

## 2012-02-16 DIAGNOSIS — I442 Atrioventricular block, complete: Secondary | ICD-10-CM

## 2012-02-16 DIAGNOSIS — Z950 Presence of cardiac pacemaker: Secondary | ICD-10-CM

## 2012-02-16 DIAGNOSIS — M7502 Adhesive capsulitis of left shoulder: Secondary | ICD-10-CM

## 2012-02-16 DIAGNOSIS — M7500 Adhesive capsulitis of unspecified shoulder: Secondary | ICD-10-CM

## 2012-02-16 DIAGNOSIS — I10 Essential (primary) hypertension: Secondary | ICD-10-CM

## 2012-02-16 LAB — PACEMAKER DEVICE OBSERVATION
AL IMPEDENCE PM: 432 Ohm
AL THRESHOLD: 0.5 V
ATRIAL PACING PM: 35.62
BATTERY VOLTAGE: 3 V
RV LEAD AMPLITUDE: 9.1 mv
RV LEAD IMPEDENCE PM: 504 Ohm

## 2012-02-16 NOTE — Assessment & Plan Note (Signed)
We will refer her to plastic surgery for consideration of steroid injections

## 2012-02-16 NOTE — Progress Notes (Signed)
  HPI  Joanne Bell is a 51 y.o. female Seen in followup for a pacemaker implanted December 2012 for complete heart block  Her workup for underlying causes was negative including MRI echo Lyme titers and chest x-ray these were all normal.  He is doing relatively well; exercise tolerance is reasonable. Her major complaint is tenderness at her device site the context of a keloid as well as some limitations and discomfort with  the use of her left arm Past Medical History  Diagnosis Date  . Atrioventricular block, complete     narrow QRS  . Hypertension   . Fibroids, intramural   . Bilateral ovarian cysts   . Colon polyps   . Pacemaker     Medtronic REVO    Past Surgical History  Procedure Date  . Myomectomy 1998    per Dr. Cherly Hensen  . Endometrial ablation 2004  . Colonoscopy 2010    benign polyps, repeat in 5 yrs    Current Outpatient Prescriptions  Medication Sig Dispense Refill  . amLODipine (NORVASC) 5 MG tablet Take 1 tablet (5 mg total) by mouth daily.  30 tablet  11  . ferrous sulfate 325 (65 FE) MG tablet Take 1 tablet (325 mg total) by mouth 2 (two) times daily.  60 tablet  11  . fluticasone (FLONASE) 50 MCG/ACT nasal spray INSTILL 2 SPRAYS IN EACH NOSTRIL EVERY DAY  16 g  11  . losartan (COZAAR) 100 MG tablet Take 1 tablet (100 mg total) by mouth daily.  30 tablet  6  . Multiple Vitamins-Minerals (MULTIVITAMIN,TX-MINERALS) tablet Take 1 tablet by mouth daily.        . nebivolol (BYSTOLIC) 10 MG tablet Take 1 tablet (10 mg total) by mouth daily.  30 tablet  11  . Soybean-Soy Isoflavones (SOY BALANCE) 325-65 MG TABS Take by mouth.        No Known Allergies  Review of Systems negative except from HPI and PMH  Physical Exam BP 145/85  Pulse 60  Resp 18  Ht 5\' 4"  (1.626 m)  Wt 161 lb 3.2 oz (73.12 kg)  BMI 27.67 kg/m2  SpO2 90%   Well developed and well nourished in no acute distress HENT normal E scleral and icterus clear Neck Supple JVP flat; carotids  brisk and full Device pocket well healed; without hematoma or erythema; there is a keloid Clear to ausculation Regular rate and rhythm, no murmurs gallops or rub Soft with active bowel sounds No clubbing cyanosis none Edema Alert and oriented, grossly normal motor and sensory function; there is some functional limitation of her left shoulder Skin Warm and Dry   Assessment and  Plan

## 2012-02-16 NOTE — Patient Instructions (Addendum)
Remote monitoring is used to monitor your Pacemaker of ICD from home. This monitoring reduces the number of office visits required to check your device to one time per year. It allows [place] to keep an eye on the functioning of your device to ensure it is working properly. You are scheduled for a device check from home on 05/23/2012. You may send your transmission at any time that day. If you have a wireless device, the transmission will be sent automatically. After your physician reviews your transmission, you will receive a postcard with your next transmission date.  Your physician wants you to follow-up in: 1 year with Dr. Graciela Husbands.  You will receive a reminder letter in the mail two months in advance. If you don't receive a letter, please call our office to schedule the follow-up appointment.

## 2012-02-16 NOTE — Assessment & Plan Note (Signed)
Reasonably controlled 

## 2012-02-16 NOTE — Assessment & Plan Note (Signed)
compelte heart block  Stable post paacing

## 2012-02-16 NOTE — Assessment & Plan Note (Signed)
[name] device was interrogated.  The information was reviewed. No changes were made in the programming.    

## 2012-02-16 NOTE — Assessment & Plan Note (Signed)
Minor restrictions of left arm  motion. I reviewed with her some exercises. If they do not improve we will send her for physical therapy

## 2012-03-07 ENCOUNTER — Telehealth: Payer: Self-pay | Admitting: Family Medicine

## 2012-03-07 NOTE — Telephone Encounter (Signed)
Pt needs samples of bystolic 10 mg

## 2012-03-10 NOTE — Telephone Encounter (Signed)
I left voice message, we do not have any samples.

## 2012-03-16 ENCOUNTER — Telehealth: Payer: Self-pay | Admitting: Family Medicine

## 2012-03-16 ENCOUNTER — Telehealth: Payer: Self-pay | Admitting: Internal Medicine

## 2012-03-16 NOTE — Telephone Encounter (Signed)
Pt wants to know if she can get more samples of nebivolol (BYSTOLIC) 10 MG tablet. Please call & let know if available, and when she can pick up. Thanks.

## 2012-03-16 NOTE — Telephone Encounter (Signed)
Will forward to Dr. Graciela Husbands for clarification of which plastic surgeon.

## 2012-03-16 NOTE — Telephone Encounter (Signed)
Samples are ready for pick up and I left voice message for pt.

## 2012-03-16 NOTE — Telephone Encounter (Signed)
pt was referred to plastic surgeon for steroid injs, and forgot who it was, she  Needs the name and phone number pls call [phone]

## 2012-03-17 NOTE — Telephone Encounter (Signed)
Dr Odis Luster  Used to be downstairs but now has moved

## 2012-03-18 NOTE — Telephone Encounter (Signed)
I called the patient. She states she found the name and contact info and called the surgeons office yesterday.

## 2012-03-25 ENCOUNTER — Other Ambulatory Visit: Payer: Self-pay | Admitting: Internal Medicine

## 2012-04-28 ENCOUNTER — Telehealth: Payer: Self-pay | Admitting: Family Medicine

## 2012-04-28 NOTE — Telephone Encounter (Signed)
Patient called stating that she would like samples of bystolic 10mg  1poqd. Please assist.

## 2012-04-29 NOTE — Telephone Encounter (Signed)
Samples are ready for pick up and left a voice message.

## 2012-05-23 ENCOUNTER — Encounter: Payer: BC Managed Care – PPO | Admitting: *Deleted

## 2012-06-01 ENCOUNTER — Encounter: Payer: Self-pay | Admitting: *Deleted

## 2012-06-02 ENCOUNTER — Encounter: Payer: Self-pay | Admitting: Internal Medicine

## 2012-06-02 ENCOUNTER — Other Ambulatory Visit: Payer: Self-pay

## 2012-06-02 ENCOUNTER — Ambulatory Visit (INDEPENDENT_AMBULATORY_CARE_PROVIDER_SITE_OTHER): Payer: BC Managed Care – PPO | Admitting: *Deleted

## 2012-06-02 DIAGNOSIS — Z950 Presence of cardiac pacemaker: Secondary | ICD-10-CM

## 2012-06-02 DIAGNOSIS — I442 Atrioventricular block, complete: Secondary | ICD-10-CM

## 2012-06-08 LAB — REMOTE PACEMAKER DEVICE
AL IMPEDENCE PM: 448 Ohm
ATRIAL PACING PM: 28.36
BRDY-0002RA: 60 {beats}/min
BRDY-0004RA: 150 {beats}/min

## 2012-06-28 ENCOUNTER — Other Ambulatory Visit: Payer: Self-pay

## 2012-06-28 DIAGNOSIS — Z1231 Encounter for screening mammogram for malignant neoplasm of breast: Secondary | ICD-10-CM

## 2012-07-07 ENCOUNTER — Encounter: Payer: Self-pay | Admitting: *Deleted

## 2012-07-15 ENCOUNTER — Ambulatory Visit
Admission: RE | Admit: 2012-07-15 | Discharge: 2012-07-15 | Disposition: A | Discharge status: Home / Self Care | Payer: BC Managed Care – PPO | Source: Ambulatory Visit

## 2012-07-15 DIAGNOSIS — Z1231 Encounter for screening mammogram for malignant neoplasm of breast: Secondary | ICD-10-CM

## 2012-07-25 ENCOUNTER — Telehealth: Payer: Self-pay | Admitting: Family Medicine

## 2012-07-25 NOTE — Telephone Encounter (Signed)
Patient called stating that she would like samples of amlodipine 5 mg 1poqd. Please assist.

## 2012-07-27 MED ORDER — AMLODIPINE BESYLATE 5 MG PO TABS: 5.0000 mg | ORAL_TABLET | Freq: Every day | ORAL | Status: DC

## 2012-07-27 NOTE — Telephone Encounter (Signed)
I left voice message for pt, we do not have any samples in, and I did send script e-scribe.

## 2012-09-12 ENCOUNTER — Encounter: Payer: BC Managed Care – PPO | Admitting: *Deleted

## 2012-09-23 ENCOUNTER — Encounter: Payer: Self-pay | Admitting: *Deleted

## 2012-11-01 ENCOUNTER — Ambulatory Visit (INDEPENDENT_AMBULATORY_CARE_PROVIDER_SITE_OTHER): Payer: BC Managed Care – PPO | Admitting: *Deleted

## 2012-11-01 DIAGNOSIS — Z950 Presence of cardiac pacemaker: Secondary | ICD-10-CM

## 2012-11-01 DIAGNOSIS — I442 Atrioventricular block, complete: Secondary | ICD-10-CM

## 2012-11-10 LAB — REMOTE PACEMAKER DEVICE
AL AMPLITUDE: 2.3 mv
AL IMPEDENCE PM: 424 Ohm
BAMS-0001: 170 {beats}/min
BRDY-0002RA: 60 {beats}/min
RV LEAD IMPEDENCE PM: 480 Ohm

## 2012-11-21 NOTE — Progress Notes (Signed)
ppm remote check. All functions normal, full details in PaceArt.  ROV w/ Dr. Graciela Husbands 02/2013

## 2012-11-30 ENCOUNTER — Encounter: Payer: Self-pay | Admitting: *Deleted

## 2012-12-08 ENCOUNTER — Encounter: Payer: Self-pay | Admitting: Internal Medicine

## 2013-01-10 ENCOUNTER — Encounter: Payer: Self-pay | Admitting: Internal Medicine

## 2013-02-15 ENCOUNTER — Ambulatory Visit (INDEPENDENT_AMBULATORY_CARE_PROVIDER_SITE_OTHER): Payer: BC Managed Care – PPO | Admitting: Internal Medicine

## 2013-02-15 ENCOUNTER — Encounter: Payer: Self-pay | Admitting: Internal Medicine

## 2013-02-15 ENCOUNTER — Encounter (INDEPENDENT_AMBULATORY_CARE_PROVIDER_SITE_OTHER): Payer: Self-pay

## 2013-02-15 VITALS — BP 155/96 | HR 61 | Ht 64.0 in | Wt 154.4 lb

## 2013-02-15 DIAGNOSIS — Z950 Presence of cardiac pacemaker: Secondary | ICD-10-CM

## 2013-02-15 DIAGNOSIS — I442 Atrioventricular block, complete: Secondary | ICD-10-CM

## 2013-02-15 LAB — MDC_IDC_ENUM_SESS_TYPE_INCLINIC
Battery Voltage: 3 V
Brady Statistic AS VP Percent: 81.99 %
Brady Statistic AS VS Percent: 0.05 %
Lead Channel Impedance Value: 488 Ohm
Lead Channel Pacing Threshold Amplitude: 1 V
Lead Channel Pacing Threshold Pulse Width: 0.4 ms
Lead Channel Sensing Intrinsic Amplitude: 2.9 mV
Lead Channel Setting Pacing Pulse Width: 0.4 ms
Zone Setting Detection Interval: 370 ms

## 2013-02-15 MED ORDER — HYDROCHLOROTHIAZIDE 12.5 MG PO TABS: 12.5000 mg | ORAL_TABLET | Freq: Every day | ORAL | Status: DC

## 2013-02-15 NOTE — Assessment & Plan Note (Signed)
Her blood pressure remains   elevated. We'll add HCT and recurrent edema she'll let us know. We'll need to check a metabolic profile in about 2 weeks time. She will follow up with her PCP

## 2013-02-15 NOTE — Patient Instructions (Signed)
Your physician has recommended you make the following change in your medication:  1) Start Hydrochlorathiazide 12.5 mg daily.  Your physician recommends that you return for lab work in: 2 weeks for BMET  Remote monitoring is used to monitor your Pacemaker of ICD from home. This monitoring reduces the number of office visits required to check your device to one time per year. It allows [place] to keep an eye on the functioning of your device to ensure it is working properly. You are scheduled for a device check from home on 05/19/2013. You may send your transmission at any time that day. If you have a wireless device, the transmission will be sent automatically. After your physician reviews your transmission, you will receive a postcard with your next transmission date.  Your physician wants you to follow-up in: one year with Rick Duff, PAC .  You will receive a reminder letter in the mail two months in advance. If you don't receive a letter, please call our office to schedule the follow-up appointment.

## 2013-02-15 NOTE — Progress Notes (Signed)
      Patient Care Team: Nelwyn Salisbury, MD as PCP - General   HPI  Joanne Bell is a 52 y.o. female Seen in followup for a pacemaker implanted December 2012 for complete heart block  Her workup for underlying causes was negative including MRI echo Lyme titers and chest x-ray these were all normal.   Her blood pressure home has been running 150 or so. She tried to take HCT in the past and was associated with edema??  The patient denies chest pain, shortness of breath, nocturnal dyspnea, orthopnea or peripheral edema.  There have been no palpitations, lightheadedness or syncope.  She has no impairment of exercise tolerance     Past Medical History      Past Medical History  Diagnosis Date  . Atrioventricular block, complete     narrow QRS  . Hypertension   . Fibroids, intramural   . Bilateral ovarian cysts   . Colon polyps   . Pacemaker     Medtronic REVO    Past Surgical History  Procedure Laterality Date  . Myomectomy  1998    per Dr. Cherly Hensen  . Endometrial ablation  2004  . Colonoscopy  2010    benign polyps, repeat in 5 yrs    Current Outpatient Prescriptions  Medication Sig Dispense Refill  . losartan (COZAAR) 100 MG tablet TAKE 1 TABLET (100 MG TOTAL) BY MOUTH DAILY.  30 tablet  6  . Multiple Vitamins-Minerals (MULTIVITAMIN,TX-MINERALS) tablet Take 1 tablet by mouth daily.        Carolin Coy Isoflavones (SOY BALANCE) 325-65 MG TABS Take by mouth.      . nebivolol (BYSTOLIC) 10 MG tablet Take 1 tablet (10 mg total) by mouth daily.  30 tablet  11   No current facility-administered medications for this visit.    No Known Allergies  Review of Systems negative except from HPI and PMH  Physical Exam BP 155/96  Pulse 61  Ht 5\' 4"  (1.626 m)  Wt 154 lb 6.4 oz (70.035 kg)  BMI 26.49 kg/m2 Well developed and nourished in no acute distress HENT normal Neck supple with JVP-flat Clear Device pocket well healed; without hematoma or erythema.  There  is no tethering  Regular rate and rhythm, no murmurs or gallops Abd-soft with active BS No Clubbing cyanosis edema Skin-warm and dry A & Oriented  Grossly normal sensory and motor function y ECG demonstrates P. synchronous pacing  Assessment and  Plan

## 2013-02-15 NOTE — Assessment & Plan Note (Signed)
Stable post pacing no escape

## 2013-02-15 NOTE — Assessment & Plan Note (Signed)
[name] device was interrogated.  The information was reviewed. No changes were made in the programming.    

## 2013-02-16 ENCOUNTER — Encounter: Payer: Self-pay | Admitting: Internal Medicine

## 2013-02-28 ENCOUNTER — Other Ambulatory Visit: Payer: BC Managed Care – PPO

## 2013-03-21 ENCOUNTER — Telehealth: Payer: Self-pay | Admitting: Internal Medicine

## 2013-03-21 NOTE — Telephone Encounter (Signed)
New message     Have dentist appt---first time with pacemaker---should pt be premedicated before procedure?

## 2013-03-21 NOTE — Telephone Encounter (Signed)
Left message explaining she did not need to be pre-medicated for dentist visit.

## 2013-03-22 ENCOUNTER — Other Ambulatory Visit: Payer: Self-pay | Admitting: Family Medicine

## 2013-03-22 ENCOUNTER — Other Ambulatory Visit: Payer: Self-pay | Admitting: Internal Medicine

## 2013-03-23 NOTE — Telephone Encounter (Signed)
Pt has not been seen since 09/2011, can we refill this?

## 2013-03-23 NOTE — Telephone Encounter (Signed)
You may want to pass this on to Dr. [name] office. We have not seen her in well over a year

## 2013-03-27 ENCOUNTER — Other Ambulatory Visit: Payer: Self-pay | Admitting: Family Medicine

## 2013-03-27 NOTE — Telephone Encounter (Signed)
Pt is out °

## 2013-03-27 NOTE — Telephone Encounter (Signed)
Pt has appt sch for tomorrow

## 2013-03-28 ENCOUNTER — Encounter: Payer: Self-pay | Admitting: Family Medicine

## 2013-03-28 ENCOUNTER — Ambulatory Visit (INDEPENDENT_AMBULATORY_CARE_PROVIDER_SITE_OTHER): Payer: BC Managed Care – PPO | Admitting: Family Medicine

## 2013-03-28 VITALS — BP 146/90 | Temp 99.3°F | Ht 64.0 in | Wt 156.0 lb

## 2013-03-28 DIAGNOSIS — I442 Atrioventricular block, complete: Secondary | ICD-10-CM

## 2013-03-28 DIAGNOSIS — I10 Essential (primary) hypertension: Secondary | ICD-10-CM

## 2013-03-28 LAB — BASIC METABOLIC PANEL
BUN: 10 mg/dL (ref 6–23)
CHLORIDE: 104 meq/L (ref 96–112)
CO2: 29 mEq/L (ref 19–32)
CREATININE: 0.9 mg/dL (ref 0.4–1.2)
Calcium: 9.4 mg/dL (ref 8.4–10.5)
GFR: 89.4 mL/min (ref >60.00)
GLUCOSE: 86 mg/dL (ref 70–99)
POTASSIUM: 3.6 meq/L (ref 3.5–5.1)
Sodium: 141 mEq/L (ref 135–145)

## 2013-03-28 MED ORDER — AMLODIPINE BESYLATE 10 MG PO TABS: 10.0000 mg | ORAL_TABLET | Freq: Every day | ORAL | Status: DC

## 2013-03-28 NOTE — Addendum Note (Signed)
Addended by: Elmer Picker on: 03/28/2013 10:05 AM   Modules accepted: Orders

## 2013-03-28 NOTE — Progress Notes (Signed)
Pre visit review using our clinic review tool, if applicable. No additional management support is needed unless otherwise documented below in the visit note. 

## 2013-03-28 NOTE — Progress Notes (Signed)
   Subjective:    Patient ID: Joanne Bell, female    DOB: 1960/06/02, 53 y.o.   MRN: [id number]  HPI Here to follow up. She feels well and has no complaints. She sees Dr. [name] regularly. Her BP has remained elevated in the range of 140s-150s over 90s. Dr. [name] wanted to add HCTZ to her regimen but she never got this filled because she is convinced that this caused her feet to swell a few years ago.    Review of Systems  Constitutional: Negative.   Respiratory: Negative.   Cardiovascular: Negative.        Objective:   Physical Exam  Constitutional: She appears well-developed and well-nourished.  Cardiovascular: Normal rate, regular rhythm, normal heart sounds and intact distal pulses.   Pulmonary/Chest: Effort normal and breath sounds normal.  Musculoskeletal: She exhibits no edema.          Assessment & Plan:  She wants to limit her meds to only 2 pills a day, so we will increase the Amlodipine to 10 mg daily. Get a BMET today.

## 2013-03-29 ENCOUNTER — Telehealth: Payer: Self-pay | Admitting: Family Medicine

## 2013-03-29 NOTE — Telephone Encounter (Signed)
Relevant patient education assigned to patient using Emmi. ° °

## 2013-03-30 ENCOUNTER — Ambulatory Visit (INDEPENDENT_AMBULATORY_CARE_PROVIDER_SITE_OTHER): Payer: BC Managed Care – PPO | Admitting: Family Medicine

## 2013-03-30 ENCOUNTER — Encounter: Payer: Self-pay | Admitting: Family Medicine

## 2013-03-30 ENCOUNTER — Telehealth: Payer: Self-pay | Admitting: Family Medicine

## 2013-03-30 VITALS — BP 140/88 | HR 122 | Temp 101.0°F | Wt 155.0 lb

## 2013-03-30 DIAGNOSIS — R509 Fever, unspecified: Secondary | ICD-10-CM

## 2013-03-30 DIAGNOSIS — J329 Chronic sinusitis, unspecified: Secondary | ICD-10-CM

## 2013-03-30 LAB — POCT INFLUENZA A/B
Influenza A, POC: POSITIVE
Influenza B, POC: POSITIVE

## 2013-03-30 MED ORDER — HYDROCOD POLST-CHLORPHEN POLST 10-8 MG/5ML PO LQCR: 5.0000 mL | Freq: Every evening | ORAL | Status: DC | PRN

## 2013-03-30 MED ORDER — AMOXICILLIN 875 MG PO TABS: 875.0000 mg | ORAL_TABLET | Freq: Two times a day (BID) | ORAL | Status: DC

## 2013-03-30 NOTE — Progress Notes (Signed)
Chief Complaint  Patient presents with  . Cough    congestion, fever, body aches, chills, headache since Tuesday     HPI:  -started: 2 days ago -symptoms:nasal congestion, sore throat, cough, sinus pain -denies:fever, SOB, NVD, tooth pain -has tried:  musinex -sick contacts/travel/risks: denies flu exposure or Ebola risks -Hx of: hx of pacemaker  ROS: See pertinent positives and negatives per HPI.  Past Medical History  Diagnosis Date  . Atrioventricular block, complete     narrow QRS  . Hypertension   . Fibroids, intramural   . Bilateral ovarian cysts   . Colon polyps   . Pacemaker     Medtronic REVO    Past Surgical History  Procedure Laterality Date  . Myomectomy  1998    per Dr. Garwin Brothers  . Endometrial ablation  2004  . Colonoscopy  2010    benign polyps, repeat in 5 yrs    Family History  Problem Relation Age of Onset  . Depression Mother   . Arthritis Father   . Diabetes Father   . Hyperlipidemia Father   . Hypertension Father   . Stroke Father     History   Social History  . Marital Status: Single    Spouse Name: N/A    Number of Children: 2  . Years of Education: N/A   Occupational History  .      Secretary   Social History Main Topics  . Smoking status: Never Smoker   . Smokeless tobacco: Never Used  . Alcohol Use: No  . Drug Use: No  . Sexual Activity: None   Other Topics Concern  . None   Social History Narrative  . None    Current outpatient prescriptions:amLODipine (NORVASC) 10 MG tablet, Take 1 tablet (10 mg total) by mouth daily., Disp: 30 tablet, Rfl: 11;  amoxicillin (AMOXIL) 875 MG tablet, Take 1 tablet (875 mg total) by mouth 2 (two) times daily., Disp: 20 tablet, Rfl: 0;  Clobetasol Propionate 0.05 % lotion, , Disp: , Rfl: ;  losartan (COZAAR) 100 MG tablet, TAKE 1 TABLET (100 MG TOTAL) BY MOUTH DAILY., Disp: 30 tablet, Rfl: 6 Multiple Vitamins-Minerals (MULTIVITAMIN,TX-MINERALS) tablet, Take 1 tablet by mouth daily.  ,  Disp: , Rfl:   EXAM:  Filed Vitals:   03/30/13 1308  BP: 140/88  Pulse: 122  Temp: 101 F (38.3 C)    Body mass index is 26.59 kg/(m^2).  GENERAL: vitals reviewed and listed above, alert, oriented, appears well hydrated and in no acute distress  HEENT: atraumatic, conjunttiva clear, no obvious abnormalities on inspection of external nose and ears, normal appearance of ear canals and TMs, clear nasal congestion, mild post oropharyngeal erythema with PND, no tonsillar edema or exudate, max sinus TTP  NECK: no obvious masses on inspection  LUNGS: clear to auscultation bilaterally, no wheezes, rales or rhonchi, good air movement  CV: HRRR, no peripheral edema  MS: moves all extremities without noticeable abnormality  PSYCH: pleasant and cooperative, no obvious depression or anxiety  ASSESSMENT AND PLAN:  Discussed the following assessment and plan:  Fever - Plan: POC Influenza A/B  Sinusitis - Plan: amoxicillin (AMOXIL) 875 MG tablet  - We discussed potential etiologies, with influenzae with sinusitis most likely -rapid flu positive -discussed tamiflu and is > 48 hours away from onset and discuss risks and benefits - she prefers not to do tamiflu -we opted to start and antibioitic for the sinus pain in case of bacterial sinusitis -We discussed treatment side  effects, likely course, transmission, and signs of developing a serious illness.  -of course, we advised to return or notify a doctor immediately if symptoms worsen or persist or new concerns arise.    Patient Instructions  INSTRUCTIONS FOR UPPER RESPIRATORY INFECTION:  -plenty of rest and fluids  -As we discussed, we have prescribed a new medication for you at this appointment. We discussed the common and serious potential adverse effects of this medication and you can review these and more with the pharmacist when you pick up your medication.  Please follow the instructions for use carefully and notify us  immediately if you have any problems taking this medication.  -nasal saline wash 2-3 times daily (use prepackaged nasal saline or bottled/distilled water if making your own)   -can use afrin or sinex nasal spray for drainage and nasal congestion - but do NOT use longer then 3-4 days  -can use tylenol or ibuprofen as directed for aches and sorethroat  -in the winter time, using a humidifier at night is helpful (please follow cleaning instructions)  -if you are taking a cough medication - use only as directed, may also try a teaspoon of honey to coat the throat and throat lozenges  -for sore throat, salt water gargles can help  -follow up if you have fevers, facial pain, tooth pain, difficulty breathing or are worsening or not getting better in 5-7 days      Sanaz Scarlett R.

## 2013-03-30 NOTE — Progress Notes (Signed)
Pre visit review using our clinic review tool, if applicable. No additional management support is needed unless otherwise documented below in the visit note. 

## 2013-03-30 NOTE — Telephone Encounter (Signed)
Patient Information:  Caller Name: [employer]  Phone: [phone]  Patient: Joanne Bell, Joanne Bell  Gender: Female  DOB: 08-21-1960  Age: 53 Years  PCP: Alysia Penna [facility])  Pregnant: No  Office Follow Up:  Does the office need to follow up with this patient?: No  Instructions For The Office: N/A  RN Note:  Hydrate and humidify.  Continue use of Mucinex.  Agreed to be seen.   Symptoms  Reason For Call & Symptoms: Called for medication for "head cold" with hard, dry coughing spells.  Reviewed Health History In EMR: Yes  Reviewed Medications In EMR: Yes  Reviewed Allergies In EMR: Yes  Reviewed Surgeries / Procedures: Yes  Date of Onset of Symptoms: 03/28/2013  Treatments Tried: Mucinex  Treatments Tried Worked: No OB / GYN:  LMP: Unknown  Guideline(s) Used:  Cough  Disposition Per Guideline:   See Today in Office  Reason For Disposition Reached:   Severe coughing spells (e.g., whooping sound after coughing, vomiting after coughing)  Advice Given:  Reassurance  Coughing is the way that our lungs remove irritants and mucus. It helps protect our lungs from getting pneumonia.  You can get a dry hacking cough after a chest cold. Sometimes this type of cough can last 1-3 weeks, and be worse at night.  Cough Medicines:  OTC Cough Drops: Cough drops can help a lot, especially for mild coughs. They reduce coughing by soothing your irritated throat and removing that tickle sensation in the back of the throat. Cough drops also have the advantage of portability - you can carry them with you.  Home Remedy - Hard Candy: Hard candy works just as well as medicine-flavored OTC cough drops. Diabetics should use sugar-free candy.  Home Remedy - Honey: This old home remedy has been shown to help decrease coughing at night. The adult dosage is 2 teaspoons (10 ml) at bedtime. Honey should not be given to infants under one year of age.  Coughing Spasms:  Drink warm fluids. Inhale warm mist  (Reason: both relax the airway and loosen up the phlegm).  Suck on cough drops or hard candy to coat the irritated throat.  Prevent Dehydration:  Drink adequate liquids.  This will help soothe an irritated or dry throat and loosen up the phlegm.  Expected Course:   Viral bronchitis (chest cold) causes a cough that lasts 1 to 3 weeks. Sometimes you may cough up lots of phlegm (sputum, mucus). The mucus can normally be white, gray, yellow, or green.  Call Back If:  Difficulty breathing  Cough lasts more than 3 weeks  Fever lasts > 3 days  You become worse.  Patient Will Follow Care Advice:  YES  Appointment Scheduled:  03/30/2013 13:30:00 Appointment Scheduled Provider:  Maudie Bell (TEXT 1st, after 20 mins can call), Joanne Bell [facility])

## 2013-03-30 NOTE — Patient Instructions (Signed)
INSTRUCTIONS FOR UPPER RESPIRATORY INFECTION:  -plenty of rest and fluids  -As we discussed, we have prescribed a new medication for you at this appointment. We discussed the common and serious potential adverse effects of this medication and you can review these and more with the pharmacist when you pick up your medication.  Please follow the instructions for use carefully and notify us immediately if you have any problems taking this medication.  -nasal saline wash 2-3 times daily (use prepackaged nasal saline or bottled/distilled water if making your own)   -can use afrin or sinex nasal spray for drainage and nasal congestion - but do NOT use longer then 3-4 days  -can use tylenol or ibuprofen as directed for aches and sorethroat  -in the winter time, using a humidifier at night is helpful (please follow cleaning instructions)  -if you are taking a cough medication - use only as directed, may also try a teaspoon of honey to coat the throat and throat lozenges  -for sore throat, salt water gargles can help  -follow up if you have fevers, facial pain, tooth pain, difficulty breathing or are worsening or not getting better in 5-7 days

## 2013-03-30 NOTE — Addendum Note (Signed)
Addended by: Lucretia Kern on: 03/30/2013 01:33 PM   Modules accepted: Orders

## 2013-03-31 NOTE — Telephone Encounter (Signed)
FYI

## 2013-04-01 ENCOUNTER — Encounter (HOSPITAL_COMMUNITY): Payer: Self-pay | Admitting: Emergency Medicine

## 2013-04-01 ENCOUNTER — Inpatient Hospital Stay (HOSPITAL_COMMUNITY)
Admission: EM | Admit: 2013-04-01 | Discharge: 2013-04-04 | DRG: 195 | Disposition: A | Discharge status: Home / Self Care | Payer: BC Managed Care – PPO | Attending: Internal Medicine | Admitting: Internal Medicine

## 2013-04-01 ENCOUNTER — Emergency Department (HOSPITAL_COMMUNITY): Payer: BC Managed Care – PPO

## 2013-04-01 DIAGNOSIS — D649 Anemia, unspecified: Secondary | ICD-10-CM

## 2013-04-01 DIAGNOSIS — Z860100 Personal history of colon polyps, unspecified: Secondary | ICD-10-CM

## 2013-04-01 DIAGNOSIS — Z8249 Family history of ischemic heart disease and other diseases of the circulatory system: Secondary | ICD-10-CM

## 2013-04-01 DIAGNOSIS — I10 Essential (primary) hypertension: Secondary | ICD-10-CM | POA: Diagnosis present

## 2013-04-01 DIAGNOSIS — M7989 Other specified soft tissue disorders: Secondary | ICD-10-CM

## 2013-04-01 DIAGNOSIS — J1100 Influenza due to unidentified influenza virus with unspecified type of pneumonia: Principal | ICD-10-CM | POA: Diagnosis present

## 2013-04-01 DIAGNOSIS — I951 Orthostatic hypotension: Secondary | ICD-10-CM | POA: Diagnosis present

## 2013-04-01 DIAGNOSIS — IMO0002 Reserved for concepts with insufficient information to code with codable children: Secondary | ICD-10-CM

## 2013-04-01 DIAGNOSIS — E876 Hypokalemia: Secondary | ICD-10-CM | POA: Diagnosis present

## 2013-04-01 DIAGNOSIS — Z79899 Other long term (current) drug therapy: Secondary | ICD-10-CM

## 2013-04-01 DIAGNOSIS — I442 Atrioventricular block, complete: Secondary | ICD-10-CM

## 2013-04-01 DIAGNOSIS — N83209 Unspecified ovarian cyst, unspecified side: Secondary | ICD-10-CM

## 2013-04-01 DIAGNOSIS — Z833 Family history of diabetes mellitus: Secondary | ICD-10-CM

## 2013-04-01 DIAGNOSIS — J111 Influenza due to unidentified influenza virus with other respiratory manifestations: Secondary | ICD-10-CM | POA: Diagnosis present

## 2013-04-01 DIAGNOSIS — Z818 Family history of other mental and behavioral disorders: Secondary | ICD-10-CM

## 2013-04-01 DIAGNOSIS — Z823 Family history of stroke: Secondary | ICD-10-CM

## 2013-04-01 DIAGNOSIS — I059 Rheumatic mitral valve disease, unspecified: Secondary | ICD-10-CM

## 2013-04-01 DIAGNOSIS — Z8261 Family history of arthritis: Secondary | ICD-10-CM

## 2013-04-01 DIAGNOSIS — R55 Syncope and collapse: Secondary | ICD-10-CM | POA: Diagnosis present

## 2013-04-01 DIAGNOSIS — Z950 Presence of cardiac pacemaker: Secondary | ICD-10-CM | POA: Diagnosis present

## 2013-04-01 DIAGNOSIS — J189 Pneumonia, unspecified organism: Secondary | ICD-10-CM | POA: Diagnosis present

## 2013-04-01 DIAGNOSIS — L910 Hypertrophic scar: Secondary | ICD-10-CM

## 2013-04-01 LAB — CBC WITH DIFFERENTIAL/PLATELET
Basophils Absolute: 0 10*3/uL (ref 0.0–0.1)
Basophils Relative: 0 % (ref 0–1)
EOS ABS: 0 10*3/uL (ref 0.0–0.7)
EOS PCT: 0 % (ref 0–5)
HCT: 36.5 % (ref 36.0–46.0)
HEMOGLOBIN: 11.7 g/dL — AB (ref 12.0–15.0)
LYMPHS ABS: 1.1 10*3/uL (ref 0.7–4.0)
Lymphocytes Relative: 24 % (ref 12–46)
MCH: 26.2 pg (ref 26.0–34.0)
MCHC: 32.1 g/dL (ref 30.0–36.0)
MCV: 81.7 fL (ref 78.0–100.0)
MONO ABS: 0.5 10*3/uL (ref 0.1–1.0)
MONOS PCT: 10 % (ref 3–12)
Neutro Abs: 3.2 10*3/uL (ref 1.7–7.7)
Neutrophils Relative %: 66 % (ref 43–77)
Platelets: 236 10*3/uL (ref 150–400)
RBC: 4.47 MIL/uL (ref 3.87–5.11)
RDW: 13.9 % (ref 11.5–15.5)
WBC: 4.9 10*3/uL (ref 4.0–10.5)

## 2013-04-01 LAB — URINALYSIS, ROUTINE W REFLEX MICROSCOPIC
Bilirubin Urine: NEGATIVE
Glucose, UA: NEGATIVE mg/dL
Hgb urine dipstick: NEGATIVE
KETONES UR: NEGATIVE mg/dL
NITRITE: NEGATIVE
PH: 5.5 (ref 5.0–8.0)
Protein, ur: NEGATIVE mg/dL
Specific Gravity, Urine: 1.022 (ref 1.005–1.030)
Urobilinogen, UA: 0.2 mg/dL (ref 0.0–1.0)

## 2013-04-01 LAB — COMPREHENSIVE METABOLIC PANEL
ALK PHOS: 65 U/L (ref 39–117)
ALT: 28 U/L (ref 0–35)
AST: 43 U/L — ABNORMAL HIGH (ref 0–37)
Albumin: 3.4 g/dL — ABNORMAL LOW (ref 3.5–5.2)
BUN: 18 mg/dL (ref 6–23)
CALCIUM: 8.7 mg/dL (ref 8.4–10.5)
CO2: 25 mEq/L (ref 19–32)
CREATININE: 1.05 mg/dL (ref 0.50–1.10)
Chloride: 98 mEq/L (ref 96–112)
GFR calc non Af Amer: 59 mL/min — ABNORMAL LOW (ref >90)
GFR, EST AFRICAN AMERICAN: 68 mL/min — AB (ref >90)
GLUCOSE: 106 mg/dL — AB (ref 70–99)
Potassium: 3.8 mEq/L (ref 3.7–5.3)
Sodium: 139 mEq/L (ref 137–147)
TOTAL PROTEIN: 7.2 g/dL (ref 6.0–8.3)
Total Bilirubin: <0.2 mg/dL — ABNORMAL LOW (ref 0.3–1.2)

## 2013-04-01 LAB — CREATININE, SERUM
Creatinine, Ser: 0.82 mg/dL (ref 0.50–1.10)
GFR calc non Af Amer: 80 mL/min — ABNORMAL LOW (ref >90)

## 2013-04-01 LAB — URINE MICROSCOPIC-ADD ON

## 2013-04-01 LAB — POCT I-STAT TROPONIN I: Troponin i, poc: 0 ng/mL (ref 0.00–0.08)

## 2013-04-01 LAB — CBC
HEMATOCRIT: 33.3 % — AB (ref 36.0–46.0)
Hemoglobin: 10.7 g/dL — ABNORMAL LOW (ref 12.0–15.0)
MCH: 26.3 pg (ref 26.0–34.0)
MCHC: 32.1 g/dL (ref 30.0–36.0)
MCV: 81.8 fL (ref 78.0–100.0)
PLATELETS: 218 10*3/uL (ref 150–400)
RBC: 4.07 MIL/uL (ref 3.87–5.11)
RDW: 14 % (ref 11.5–15.5)
WBC: 3.1 10*3/uL — ABNORMAL LOW (ref 4.0–10.5)

## 2013-04-01 LAB — TROPONIN I: Troponin I: <0.3 ng/mL (ref <0.30)

## 2013-04-01 LAB — D-DIMER, QUANTITATIVE: D-Dimer, Quant: 1.69 ug/mL-FEU — ABNORMAL HIGH (ref 0.00–0.48)

## 2013-04-01 LAB — STREP PNEUMONIAE URINARY ANTIGEN: Strep Pneumo Urinary Antigen: NEGATIVE

## 2013-04-01 LAB — LACTIC ACID, PLASMA: Lactic Acid, Venous: 1 mmol/L (ref 0.5–2.2)

## 2013-04-01 MED ORDER — DEXTROSE 5 % IV SOLN
500.0000 mg | INTRAVENOUS | Status: DC
  Administered 2013-04-01 – 2013-04-02 (×2): 500 mg via INTRAVENOUS
  Filled 2013-04-01 (×2): qty 500

## 2013-04-01 MED ORDER — GUAIFENESIN ER 600 MG PO TB12
600.0000 mg | ORAL_TABLET | Freq: Two times a day (BID) | ORAL | Status: DC
  Administered 2013-04-01 – 2013-04-04 (×7): 600 mg via ORAL
  Filled 2013-04-01 (×8): qty 1

## 2013-04-01 MED ORDER — OXYMETAZOLINE HCL 0.05 % NA SOLN
1.0000 | Freq: Two times a day (BID) | NASAL | Status: DC
  Filled 2013-04-01: qty 15

## 2013-04-01 MED ORDER — SODIUM CHLORIDE 0.9 % IV SOLN
INTRAVENOUS | Status: DC
  Administered 2013-04-01 – 2013-04-03 (×6): via INTRAVENOUS

## 2013-04-01 MED ORDER — DEXTROSE 5 % IV SOLN
1.0000 g | Freq: Once | INTRAVENOUS | Status: AC
  Administered 2013-04-01: 1 g via INTRAVENOUS
  Filled 2013-04-01: qty 10

## 2013-04-01 MED ORDER — SALINE SPRAY 0.65 % NA SOLN
1.0000 | NASAL | Status: DC | PRN
  Filled 2013-04-01: qty 44

## 2013-04-01 MED ORDER — ONDANSETRON HCL 4 MG/2ML IJ SOLN: 4.0000 mg | Freq: Four times a day (QID) | INTRAMUSCULAR | Status: DC | PRN

## 2013-04-01 MED ORDER — SODIUM CHLORIDE 0.9 % IV BOLUS (SEPSIS)
1000.0000 mL | Freq: Once | INTRAVENOUS | Status: AC
  Administered 2013-04-01: 1000 mL via INTRAVENOUS

## 2013-04-01 MED ORDER — POLYETHYLENE GLYCOL 3350 17 G PO PACK
17.0000 g | PACK | Freq: Every day | ORAL | Status: DC | PRN
  Filled 2013-04-01: qty 1

## 2013-04-01 MED ORDER — FLUTICASONE PROPIONATE 50 MCG/ACT NA SUSP
2.0000 | Freq: Every day | NASAL | Status: DC
  Filled 2013-04-01: qty 16

## 2013-04-01 MED ORDER — DEXTROSE 5 % IV SOLN
1.0000 g | INTRAVENOUS | Status: DC
  Filled 2013-04-01: qty 10

## 2013-04-01 MED ORDER — ALBUTEROL SULFATE (2.5 MG/3ML) 0.083% IN NEBU: 2.5000 mg | INHALATION_SOLUTION | RESPIRATORY_TRACT | Status: DC | PRN

## 2013-04-01 MED ORDER — HYDROCOD POLST-CHLORPHEN POLST 10-8 MG/5ML PO LQCR: 5.0000 mL | Freq: Two times a day (BID) | ORAL | Status: DC | PRN

## 2013-04-01 MED ORDER — ENOXAPARIN SODIUM 40 MG/0.4ML ~~LOC~~ SOLN
40.0000 mg | SUBCUTANEOUS | Status: DC
  Administered 2013-04-01 – 2013-04-03 (×3): 40 mg via SUBCUTANEOUS
  Filled 2013-04-01 (×4): qty 0.4

## 2013-04-01 MED ORDER — ACETAMINOPHEN 325 MG PO TABS
650.0000 mg | ORAL_TABLET | Freq: Once | ORAL | Status: AC
  Administered 2013-04-01: 650 mg via ORAL
  Filled 2013-04-01: qty 2

## 2013-04-01 MED ORDER — OXYCODONE HCL 5 MG PO TABS: 5.0000 mg | ORAL_TABLET | Freq: Four times a day (QID) | ORAL | Status: DC | PRN

## 2013-04-01 MED ORDER — ACETAMINOPHEN 325 MG PO TABS
650.0000 mg | ORAL_TABLET | Freq: Four times a day (QID) | ORAL | Status: DC | PRN
  Administered 2013-04-01 – 2013-04-02 (×4): 650 mg via ORAL
  Filled 2013-04-01 (×4): qty 2

## 2013-04-01 MED ORDER — DEXTROSE 5 % IV SOLN
500.0000 mg | Freq: Once | INTRAVENOUS | Status: AC
  Filled 2013-04-01: qty 500

## 2013-04-01 MED ORDER — DEXTROSE 5 % IV SOLN
1.0000 g | INTRAVENOUS | Status: DC
  Administered 2013-04-02: 1 g via INTRAVENOUS
  Filled 2013-04-01 (×2): qty 10

## 2013-04-01 NOTE — ED Notes (Signed)
Patient was dx'd with flu yesterday. Patients PCP gave patient ABX and per patient they made her stomach upset so she got up to go the the restroom and had a LOC. Patients husband states patient was out for approximately 10 minutes. During the episode patient had snoring respirations and eyes open but would not respond. Upon EMS arrival patient was cao x4 and denied any pain from fall and had no complaints. EMS states that patient has no seizure activity, negative stroke screen and CBG 116. Patient has non-demand pacemaker.

## 2013-04-01 NOTE — Progress Notes (Signed)
VASCULAR LAB PRELIMINARY  PRELIMINARY  PRELIMINARY  PRELIMINARY  Bilateral lower extremity venous duplex  completed.    Preliminary report:  Bilateral:  No evidence of DVT, superficial thrombosis, or Baker's Cyst.    Asani Mcburney, RVT 04/01/2013, 12:09 PM

## 2013-04-01 NOTE — ED Provider Notes (Signed)
CSN: [id number]     Arrival date & time 04/01/13  0350 History   First MD Initiated Contact with Patient 04/01/13 [phone]     Chief Complaint  Patient presents with  . Loss of Consciousness   (Consider location/radiation/quality/duration/timing/severity/associated sxs/prior Treatment) HPI This patient is a very pleasant 53 yo woman with a pacemaker who was diagnosed with influenza A and B via PCR 3d ago. She was also started on Amoxicillin for suspected concurrent pneumonia. She presents after an episode of syncope. The patient got up from bed in the middle of the night to use the restroom, felt lightheaded and passed out. Husband heard the patient fall and came to the bathroom to find her limp but with eyes open and breathing with good color. It took a couple of minutes before he was able to get a verbal response. The patient was not incontinent of urine. Husband did not witness any unusual motor activity. Patient has no h/o similar sx.   Patient does have a history of complete AV block requiring pacemaker. She reports that her po intake has been diminished. She has not experienced any chest pain or SOB. No abdominal pain. No vomiting or diarrhea.   Past Medical History  Diagnosis Date  . Atrioventricular block, complete     narrow QRS  . Hypertension   . Fibroids, intramural   . Bilateral ovarian cysts   . Colon polyps   . Pacemaker     Medtronic REVO   Past Surgical History  Procedure Laterality Date  . Myomectomy  1998    per Dr. Garwin Brothers  . Endometrial ablation  2004  . Colonoscopy  2010    benign polyps, repeat in 5 yrs   Family History  Problem Relation Age of Onset  . Depression Mother   . Arthritis Father   . Diabetes Father   . Hyperlipidemia Father   . Hypertension Father   . Stroke Father    History  Substance Use Topics  . Smoking status: Never Smoker   . Smokeless tobacco: Never Used  . Alcohol Use: No   OB History   Grav Para Term Preterm Abortions TAB SAB  Ect Mult Living                 Review of Systems Ten point review of symptoms performed and is negative with the exception of symptoms noted above.   Allergies  Review of patient's allergies indicates no known allergies.  Home Medications   Current Outpatient Rx  Name  Route  Sig  Dispense  Refill  . amLODipine (NORVASC) 10 MG tablet   Oral   Take 1 tablet (10 mg total) by mouth daily.   30 tablet   11   . amoxicillin (AMOXIL) 875 MG tablet   Oral   Take 1 tablet (875 mg total) by mouth 2 (two) times daily.   20 tablet   0   . chlorpheniramine-HYDROcodone (TUSSIONEX PENNKINETIC ER) 10-8 MG/5ML LQCR   Oral   Take 5 mLs by mouth at bedtime as needed for cough.   115 mL   0   . Clobetasol Propionate 0.05 % lotion               . losartan (COZAAR) 100 MG tablet      TAKE 1 TABLET (100 MG TOTAL) BY MOUTH DAILY.   30 tablet   6   . Multiple Vitamins-Minerals (MULTIVITAMIN,TX-MINERALS) tablet   Oral   Take 1 tablet by mouth daily.  BP 126/81  Pulse 94  Temp(Src) 101.6 F (38.7 C) (Oral)  Resp 13  Ht 5\' 4"  (1.626 m)  Wt 155 lb (70.308 kg)  BMI 26.59 kg/m2  SpO2 93% Physical Exam Gen: well developed and well nourished appearing Head: NCAT Eyes: PERL, EOMI Nose: no epistaixis or rhinorrhea Mouth/throat: mucosa is moist and pink Neck: supple, no stridor Lungs: CTA B, no wheezing, rhonchi or rales CV: RRR - resting pulse around 400 bpm, 2/6 systolic murmur, extremities appear well perfused.  Abd: soft, notender, nondistended Back: no ttp, no cva ttp Skin: warm and dry Ext: normal to inspection, no dependent edema Neuro: CN ii-xii grossly intact, no focal deficits, no motor deficits, normal finger to nose, normal speech Psyche; normal affect,  calm and cooperative.   ED Course  Procedures (including critical care time) Labs Review Results for orders placed during the hospital encounter of 04/01/13 (from the past 24 hour(s))  CBC WITH  DIFFERENTIAL     Status: Abnormal   Collection Time    04/01/13  4:40 AM      Result Value Range   WBC 4.9  4.0 - 10.5 K/uL   RBC 4.47  3.87 - 5.11 MIL/uL   Hemoglobin 11.7 (*) 12.0 - 15.0 g/dL   HCT 36.5  36.0 - 46.0 %   MCV 81.7  78.0 - 100.0 fL   MCH 26.2  26.0 - 34.0 pg   MCHC 32.1  30.0 - 36.0 g/dL   RDW 13.9  11.5 - 15.5 %   Platelets 236  150 - 400 K/uL   Neutrophils Relative % 66  43 - 77 %   Neutro Abs 3.2  1.7 - 7.7 K/uL   Lymphocytes Relative 24  12 - 46 %   Lymphs Abs 1.1  0.7 - 4.0 K/uL   Monocytes Relative 10  3 - 12 %   Monocytes Absolute 0.5  0.1 - 1.0 K/uL   Eosinophils Relative 0  0 - 5 %   Eosinophils Absolute 0.0  0.0 - 0.7 K/uL   Basophils Relative 0  0 - 1 %   Basophils Absolute 0.0  0.0 - 0.1 K/uL  COMPREHENSIVE METABOLIC PANEL     Status: Abnormal   Collection Time    04/01/13  4:40 AM      Result Value Range   Sodium 139  137 - 147 mEq/L   Potassium 3.8  3.7 - 5.3 mEq/L   Chloride 98  96 - 112 mEq/L   CO2 25  19 - 32 mEq/L   Glucose, Bld 106 (*) 70 - 99 mg/dL   BUN 18  6 - 23 mg/dL   Creatinine, Ser 1.05  0.50 - 1.10 mg/dL   Calcium 8.7  8.4 - 10.5 mg/dL   Total Protein 7.2  6.0 - 8.3 g/dL   Albumin 3.4 (*) 3.5 - 5.2 g/dL   AST 43 (*) 0 - 37 U/L   ALT 28  0 - 35 U/L   Alkaline Phosphatase 65  39 - 117 U/L   Total Bilirubin <0.2 (*) 0.3 - 1.2 mg/dL   GFR calc non Af Amer 59 (*) >90 mL/min   GFR calc Af Amer 68 (*) >90 mL/min  D-DIMER, QUANTITATIVE     Status: Abnormal   Collection Time    04/01/13  4:40 AM      Result Value Range   D-Dimer, Quant 1.69 (*) 0.00 - 0.48 ug/mL-FEU  POCT I-STAT TROPONIN I     Status:  None   Collection Time    04/01/13  4:47 AM      Result Value Range   Troponin i, poc 0.00  0.00 - 0.08 ng/mL   Comment 3            EKG: paced rhythm, 92 bpm, wide qrs, no acute ischemic changes, non specific T wave abnormalities   MDM  Patient is s/p syncope in the setting influenza complicated by pneumonia. She had  significant drop in BP from supine to standing with sensation of near syncope. We will re-evaluate after 2nd liter with repeat orthostatic VS and ambulatory trial.   0730 - Patient re-evaluated. Orthostatic VS have improved. However, patient still lightheaded with standing. We will admit for evaluation during which time the patient's pacemaker may also be interrogated.  D-dimer was obtained. However, I think we clearly have a diagnosis - influenza complicated by pneumonia - explaining the both the patient's syncope with orthostatic hypotension and elevated d-dimer. I do not feel a CT chest is indicated.   0754 - Case discussed with Dr. Sloan Leiter who will admit the patient to the Tele Unit.   Elyn Peers, MD 04/01/13 [phone]

## 2013-04-01 NOTE — ED Notes (Signed)
Report given to 3E-- Admitting dr at bedside.

## 2013-04-01 NOTE — Progress Notes (Signed)
Echo Lab  2D Echocardiogram completed.  [place], [place] 04/01/2013 12:06 PM

## 2013-04-01 NOTE — H&P (Signed)
PATIENT DETAILS Name: Joanne Bell Age: 53 y.o. Sex: female Date of Birth: Nov 11, 1960 Admit Date: 04/01/2013 PCP:FRY,STEPHEN A, MD   CHIEF COMPLAINT:  Syncope  HPI: Joanne Bell is a 53 y.o. 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 This past Tuesday, patient started having fever, cough, nasal congestion/coldlike symptoms along with generalized myalgia. She went to her primary care practitioner this past Thursday, and was given amoxicillin for sinusitis. She did test positive for influenza, however since his symptoms were ongoing for more than 48 hours it was elected not to start treatment with Tamiflu. She claims that since he started amoxicillin it started making her "sick to her stomach"- which she describes as epigastric pain and the urge to have a bowel movement. She fortunately has not had good oral intake because of acute illness. When she saw her primary care practitioner recently, dose of amlodipine was doubled from 5 to10 mg, dose of losartan was unchanged. Early this morning, patient woke up with abdominal pain and thought she needed to have a bowel movement, on standing up she felt lightheaded, and then syncopized. Her husband who was with her, did not notice any seizure-like activity, there was no tongue biting, no urinary or fecal incontinence. Patient was then brought to the emergency room, where she was found to have fever, was found to have significant orthostatic vitals (supine blood pressure and heart rate 120/72, 96-standing blood pressure and heart rate 94/51, 110). Chest x-ray showed possible pneumonia, I was asked to admit this patient for further evaluation and treatment. There is no history of headache, no history of chest pain or shortness of breath. No history of nausea, vomiting or diarrhea. She does have a cough, however it is mostly  dry.   ALLERGIES:  No Known Allergies  PAST MEDICAL HISTORY: Past Medical History  Diagnosis Date  . Atrioventricular block, complete     narrow QRS  . Hypertension   . Fibroids, intramural   . Bilateral ovarian cysts   . Colon polyps   . Pacemaker     Medtronic REVO    PAST SURGICAL HISTORY: Past Surgical History  Procedure Laterality Date  . Myomectomy  1998    per Dr. Garwin Brothers  . Endometrial ablation  2004  . Colonoscopy  2010    benign polyps, repeat in 5 yrs    MEDICATIONS AT HOME: Prior to Admission medications   Medication Sig Start Date End Date Taking? Authorizing Provider  amLODipine (NORVASC) 10 MG tablet Take 1 tablet (10 mg total) by mouth daily. 03/28/13  Yes [name], MD  amoxicillin (AMOXIL) 875 MG tablet Take 1 tablet (875 mg total) by mouth 2 (two) times daily. 03/30/13  Yes Lucretia Kern, DO  chlorpheniramine-HYDROcodone (TUSSIONEX PENNKINETIC ER) 10-8 MG/5ML LQCR Take 5 mLs by mouth at bedtime as needed for cough. 03/30/13  Yes Lucretia Kern, DO  losartan (COZAAR) 100 MG tablet TAKE 1 TABLET (100 MG TOTAL) BY MOUTH DAILY. 03/22/13  Yes Deboraha Sprang, MD  Multiple Vitamins-Minerals (MULTIVITAMIN,TX-MINERALS) tablet Take 1 tablet by mouth daily.     Yes Historical Provider, MD    FAMILY HISTORY: Family History  Problem Relation Age of Onset  . Depression Mother   . Arthritis Father   . Diabetes Father   . Hyperlipidemia Father   . Hypertension Father   . Stroke Father     SOCIAL  HISTORY:  reports that she has never smoked. She has never used smokeless tobacco. She reports that she does not drink alcohol or use illicit drugs.  REVIEW OF SYSTEMS:  Constitutional:   No  weight loss, night sweats,chills, fatigue.  HEENT:    No headaches, Difficulty swallowing,Tooth/dental problems,Sore throat,  No sneezing, itching, ear ache  Cardio-vascular: No chest pain,  Orthopnea, PND, swelling in lower extremities, anasarca, dizziness,  palpitations  GI:  No heartburn, indigestion, abdominal pain, nausea, vomiting, diarrhea, change in  bowel habits, loss of appetite  Resp: No shortness of breath with exertion or at rest.  No wheezing.No chest wall deformity  Skin:  no rash or lesions.  GU:  no dysuria, change in color of urine, no urgency or frequency.  No flank pain.  Musculoskeletal: No joint pain or swelling.  No decreased range of motion.  No back pain.  Psych: No change in mood or affect. No depression or anxiety.  No memory loss.   PHYSICAL EXAM: Blood pressure 109/70, pulse 99, temperature 99.7 F (37.6 C), temperature source Oral, resp. rate 19, height 5\' 4"  (1.626 m), weight 70.308 kg (155 lb), SpO2 94.00%.  General appearance :Awake, alert, not in any distress. Speech Clear. Not toxic Looking HEENT: Atraumatic and Normocephalic, pupils equally reactive to light and accomodation Neck: supple, no JVD. No cervical lymphadenopathy.  Chest:Good air entry bilaterally, no added sounds  CVS: S1 S2 regular, no murmurs.  Abdomen: Bowel sounds present, Non tender and not distended with no gaurding, rigidity or rebound. Extremities: B/L Lower Ext shows no edema, both legs are warm to touch Neurology: Awake alert, and oriented X 3, CN II-XII intact, Non focal Skin:No Rash Wounds:N/A  LABS ON ADMISSION:   Recent Labs  04/01/13 0440  NA 139  K 3.8  CL 98  CO2 25  GLUCOSE 106*  BUN 18  CREATININE 1.05  CALCIUM 8.7    Recent Labs  04/01/13 0440  AST 43*  ALT 28  ALKPHOS 65  BILITOT <0.2*  PROT 7.2  ALBUMIN 3.4*   No results found for this basename: LIPASE, AMYLASE,  in the last 72 hours  Recent Labs  04/01/13 0440  WBC 4.9  NEUTROABS 3.2  HGB 11.7*  HCT 36.5  MCV 81.7  PLT 236   No results found for this basename: CKTOTAL, CKMB, CKMBINDEX, TROPONINI,  in the last 72 hours  Recent Labs  04/01/13 0440  DDIMER 1.69*   No components found with this basename: POCBNP,     RADIOLOGIC STUDIES ON ADMISSION: Dg Chest 2 View  04/01/2013   CLINICAL DATA:  Loss of consciousness.  EXAM: CHEST  2 VIEW  COMPARISON:  Chest radiograph performed 02/17/2011  FINDINGS: The lungs are well-aerated. Mild bibasilar opacities may reflect atelectasis or possibly mild pneumonia. There is no evidence of pleural effusion or pneumothorax.  The heart is mildly enlarged. A pacemaker is seen at the left chest wall, with leads ending at the right atrium and right ventricle. No acute osseous abnormalities are seen.  IMPRESSION: Mild bibasilar airspace opacities may reflect atelectasis or possibly mild pneumonia.   Electronically Signed   By: Garald Balding M.D.   On: 04/01/2013 05:17     EKG: Independently reviewed. Paced rhythm   ASSESSMENT AND PLAN: Present on Admission:  . Syncope - Secondary to orthostatic hypotension  - Monitor in telemetry, hydrate and recheck orthostatics in a.m.  - Although d-dimer is elevated, clinical picture not consistent with a VTE. Patient has no  leg swelling, does not have any chest pain or shortness of breath, nor is she hypoxic. We'll check a Doppler of her lower extremities, if negative would not pursue any further workup. - Hydrate and follow clinical course   . PNA (pneumonia) - Febrile, positive for influenza but her symptoms have been ongoing for 6 days. Will treat with Rocephin and Zithromax - Follow cultures, urine Legionella and streptococcal antigen   . Orthostatic hypotension - Multifactorial-secondary to poor oral intake, increasing her blood pressure medications and also from acute illness  - Hydrate, hold all antihypertensives, and recheck orthostatics in a.m.   . HYPERTENSION - Given orthostatic hypotension, hold all antihypertensives. Resume when able   . Pacemaker-Medtronic - Paced rhythm. Although she has a history of AV block, clinical presentation is very suspicious for orthostatic mechanism for syncope. - Will monitor in  telemetry and follow clinical course  Further plan will depend as patient's clinical course evolves and further radiologic and laboratory data become available. Patient will be monitored closely.  Above noted plan was discussed with patient/spouse, they were in agreement.   DVT Prophylaxis: Prophylactic Lovenox   Code Status: Full Code  Total time spent for admission equals 45 minutes.  [facility] Pager [phone]  If 7PM-7AM, please contact night-coverage [email or web address] Password [email or web address] 04/01/2013, 9:02 AM

## 2013-04-02 LAB — BASIC METABOLIC PANEL
BUN: 7 mg/dL (ref 6–23)
CALCIUM: 8.5 mg/dL (ref 8.4–10.5)
CO2: 24 mEq/L (ref 19–32)
Chloride: 103 mEq/L (ref 96–112)
Creatinine, Ser: 0.81 mg/dL (ref 0.50–1.10)
GFR calc Af Amer: >90 mL/min (ref >90)
GFR calc non Af Amer: 81 mL/min — ABNORMAL LOW (ref >90)
Glucose, Bld: 99 mg/dL (ref 70–99)
Potassium: 3.4 mEq/L — ABNORMAL LOW (ref 3.7–5.3)
Sodium: 140 mEq/L (ref 137–147)

## 2013-04-02 LAB — TROPONIN I: Troponin I: <0.3 ng/mL (ref <0.30)

## 2013-04-02 LAB — CBC
HEMATOCRIT: 33.5 % — AB (ref 36.0–46.0)
Hemoglobin: 10.8 g/dL — ABNORMAL LOW (ref 12.0–15.0)
MCH: 26.6 pg (ref 26.0–34.0)
MCHC: 32.2 g/dL (ref 30.0–36.0)
MCV: 82.5 fL (ref 78.0–100.0)
PLATELETS: 228 10*3/uL (ref 150–400)
RBC: 4.06 MIL/uL (ref 3.87–5.11)
RDW: 14 % (ref 11.5–15.5)
WBC: 5 10*3/uL (ref 4.0–10.5)

## 2013-04-02 LAB — LEGIONELLA ANTIGEN, URINE: Legionella Antigen, Urine: NEGATIVE

## 2013-04-02 LAB — HIV ANTIBODY (ROUTINE TESTING W REFLEX): HIV: NONREACTIVE

## 2013-04-02 MED ORDER — SCOPOLAMINE 1 MG/3DAYS TD PT72
1.0000 | MEDICATED_PATCH | TRANSDERMAL | Status: DC
  Administered 2013-04-02: 1.5 mg via TRANSDERMAL
  Filled 2013-04-02 (×2): qty 1

## 2013-04-02 MED ORDER — MECLIZINE HCL 25 MG PO TABS
25.0000 mg | ORAL_TABLET | Freq: Three times a day (TID) | ORAL | Status: DC
  Administered 2013-04-02 – 2013-04-04 (×7): 25 mg via ORAL
  Filled 2013-04-02 (×9): qty 1

## 2013-04-02 MED ORDER — AZITHROMYCIN 500 MG PO TABS
500.0000 mg | ORAL_TABLET | Freq: Every day | ORAL | Status: DC
  Administered 2013-04-02: 500 mg via ORAL
  Filled 2013-04-02 (×2): qty 1

## 2013-04-02 MED ORDER — OSELTAMIVIR PHOSPHATE 75 MG PO CAPS: 75.0000 mg | ORAL_CAPSULE | Freq: Two times a day (BID) | ORAL | Status: DC

## 2013-04-02 NOTE — Progress Notes (Signed)
Orthostatic VS are positive.  They were taken lying, sitting, and standing.  She could not stand for 3 minutes due to being dizzy.  Her BP while lying was 150/82 and her pulse was 100.  Her BP while sitting was 124/77 and her pulse was 104.  Her BP while standing was 110/80 and her pulse as 113.  M. Lynch text paged.

## 2013-04-02 NOTE — Progress Notes (Addendum)
[name] temperature went back up to 101.4 at 2200.  She received Tylenol for the fever.  Joanne Bell was notified.

## 2013-04-02 NOTE — Progress Notes (Addendum)
[facility] PROGRESS NOTE Interim History: 53 y.o. 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 This past Tuesday, patient started having fever, cough, nasal congestion/coldlike symptoms along with generalized myalgia. She went to her primary care practitioner this past Thursday, and was given amoxicillin for sinusitis. She did test positive for influenza, however since his symptoms were ongoing for more than 48 hours it was elected not to start treatment with Tamiflu. She claims that since he started amoxicillin it started making her "sick to her stomach"- which she describes as epigastric pain and the urge to have a bowel movement. She fortunately has not had good oral intake because of acute illness. When she saw her primary care practitioner recently, dose of amlodipine was doubled from 5 to10 mg, dose of losartan was unchanged. Early this morning, patient woke up with abdominal pain and thought she needed to have a bowel movement, on standing up she felt lightheaded, and then syncopized. Her husband who was with her, did not notice any seizure-like activity, there was no tongue biting, no urinary or fecal incontinence. Patient was then brought to the emergency room, where she was found to have fever, was found to have significant orthostatic vitals (supine blood pressure and heart rate 120/72, 96-standing blood pressure and heart rate 94/51, 110). Chest x-ray showed possible pneumonia, I was asked to admit this patient for further evaluation and treatment.    Assessment/Plan: Vertigo with Orthostatic hypotension: - Antivert, scopolamine patch, consult OT. - specific gravity 1.022. - improved with IV fluids. - Cr improved.  PNA (pneumonia): - agree with rocephin and azithromycin - tylenol for fevers. - HIV negative  Influenza: - She did test positive for influenza,  however since his symptoms were ongoing for more than 48 hours it was elected not to start treatment with Tamiflu.  HYPERTENSION: - cont to hold Given orthostatic hypotension,.   Code Status: full Family Communication: none  Disposition Plan: inpatient   Consultants:  noen  Procedures:  CXR  Antibiotics:  Rocephin and azithro 1.24.2014. (indicate start date, and stop date if known)  HPI/Subjective: She now relates dizziness when moving head that will last < 1 min  Objective: Filed Vitals:   04/02/13 0209 04/02/13 0552 04/02/13 0555 04/02/13 0556  BP: 140/72 150/82 124/77 110/80  Pulse: 87 100 104 113  Temp: 99.2 F (37.3 C) 101.8 F (38.8 C)    TempSrc: Oral Oral    Resp: 18 18    Height:      Weight:    68.357 kg (150 lb 11.2 oz)  SpO2: 94% 95%      Intake/Output Summary (Last 24 hours) at 04/02/13 0802 Last data filed at 04/02/13 0552  Gross per 24 hour  Intake 1686.67 ml  Output   2600 ml  Net -913.33 ml   Filed Weights   04/01/13 0404 04/02/13 0556  Weight: 70.308 kg (155 lb) 68.357 kg (150 lb 11.2 oz)    Exam:  General: Alert, awake, oriented x3, in no acute distress.  HEENT: No bruits, no goiter.  Heart: Regular rate and rhythm, without murmurs, rubs, gallops.  Lungs: Good air movement, clear to auscultation.  Abdomen: Soft, nontender, nondistended, positive bowel sounds.  Neuro: Grossly intact, nonfocal.   Data Reviewed: Basic Metabolic Panel:  Recent Labs Lab 03/28/13 1005 04/01/13 0440 04/01/13 1300 04/02/13 0555  NA 141 139  --  140  K 3.6  3.8  --  3.4*  CL 104 98  --  103  CO2 29 25  --  24  GLUCOSE 86 106*  --  99  BUN 10 18  --  7  CREATININE 0.9 1.05 0.82 0.81  CALCIUM 9.4 8.7  --  8.5   Liver Function Tests:  Recent Labs Lab 04/01/13 0440  AST 43*  ALT 28  ALKPHOS 65  BILITOT <0.2*  PROT 7.2  ALBUMIN 3.4*   No results found for this basename: LIPASE, AMYLASE,  in the last 168 hours No results found for this  basename: AMMONIA,  in the last 168 hours CBC:  Recent Labs Lab 04/01/13 0440 04/01/13 1300 04/02/13 0555  WBC 4.9 3.1* 5.0  NEUTROABS 3.2  --   --   HGB 11.7* 10.7* 10.8*  HCT 36.5 33.3* 33.5*  MCV 81.7 81.8 82.5  PLT 236 218 228   Cardiac Enzymes:  Recent Labs Lab 04/01/13 1300 04/01/13 2016 04/01/13 2340  TROPONINI <0.30 <0.30 <0.30   BNP (last 3 results) No results found for this basename: PROBNP,  in the last 8760 hours CBG: No results found for this basename: GLUCAP,  in the last 168 hours  No results found for this or any previous visit (from the past 240 hour(s)).   Studies: Dg Chest 2 View  04/01/2013   CLINICAL DATA:  Loss of consciousness.  EXAM: CHEST  2 VIEW  COMPARISON:  Chest radiograph performed 02/17/2011  FINDINGS: The lungs are well-aerated. Mild bibasilar opacities may reflect atelectasis or possibly mild pneumonia. There is no evidence of pleural effusion or pneumothorax.  The heart is mildly enlarged. A pacemaker is seen at the left chest wall, with leads ending at the right atrium and right ventricle. No acute osseous abnormalities are seen.  IMPRESSION: Mild bibasilar airspace opacities may reflect atelectasis or possibly mild pneumonia.   Electronically Signed   By: Garald Balding M.D.   On: 04/01/2013 05:17    Scheduled Meds: . azithromycin  500 mg Intravenous Q24H  . cefTRIAXone (ROCEPHIN)  IV  1 g Intravenous Q24H  . enoxaparin (LOVENOX) injection  40 mg Subcutaneous Q24H  . fluticasone  2 spray Each Nare Daily  . guaiFENesin  600 mg Oral BID  . oxymetazoline  1 spray Each Nare BID   Continuous Infusions: . sodium chloride 125 mL/hr at 04/02/13 0224     Charlynne Cousins  [facility] Pager [phone]. If 8PM-8AM, please contact night-coverage at [email or web address], password [facility] 04/02/2013, 8:02 AM  LOS: 1 day

## 2013-04-03 MED ORDER — LEVOFLOXACIN 750 MG PO TABS
750.0000 mg | ORAL_TABLET | Freq: Every day | ORAL | Status: DC
Start: 2013-04-03 — End: 2013-04-04
  Administered 2013-04-03 – 2013-04-04 (×2): 750 mg via ORAL
  Filled 2013-04-03 (×2): qty 1

## 2013-04-03 MED ORDER — LEVOFLOXACIN 750 MG PO TABS: 750.0000 mg | ORAL_TABLET | Freq: Every day | ORAL | Status: DC

## 2013-04-03 MED ORDER — POTASSIUM CHLORIDE CRYS ER 20 MEQ PO TBCR
40.0000 meq | EXTENDED_RELEASE_TABLET | Freq: Two times a day (BID) | ORAL | Status: AC
  Administered 2013-04-03 (×2): 40 meq via ORAL
  Filled 2013-04-03 (×2): qty 2

## 2013-04-03 NOTE — Care Management Note (Addendum)
  Page 1 of 1   04/03/2013     12:16:10 PM   CARE MANAGEMENT NOTE 04/03/2013  Patient:  Joanne Bell, Joanne Bell   Account Number:  [id number]  Date Initiated:  04/03/2013  Documentation initiated by:  Juanmanuel Marohl  Subjective/Objective Assessment:   adm w/ acute on chronic diastolic heart failure     Action/Plan:   CM will follow for dispositon needs   Anticipated DC Date:  04/06/2013   Anticipated DC Plan:  HOME/SELF CARE         Choice offered to / List presented to:             Status of service:  Completed, signed off Medicare Important Message given?   (If response is "NO", the following Medicare IM given date fields will be blank) Date Medicare IM given:   Date Additional Medicare IM given:    Discharge Disposition:    Per UR Regulation:  Reviewed for med. necessity/level of care/duration of stay  If discussed at [place] of Stay Meetings, dates discussed:    Comments:  04/03/2013 Social:  From home with husband OT Consult Pending ______ IV ABX d/c and changed to PO ABX today Disposition Pending ADD: [address] RN, BSN, [place], [place] ([place]) [phone] 04/03/2013

## 2013-04-03 NOTE — Progress Notes (Signed)
UR completed Joanne Bell K. Breea Loncar, RN, BSN, [place], CCM  04/03/2013 12:24 PM

## 2013-04-03 NOTE — Progress Notes (Signed)
Temp 100.3 last PM. Tylenol given. Temp = 98.8 upon reassessment. Also, OT eval ordered per MD note. Will continue to monitor.

## 2013-04-03 NOTE — Evaluation (Signed)
Occupational Therapy Evaluation Patient Details Name: Joanne Bell MRN: [id number] DOB: [birth year] Today's Date: 04/03/2013 Time: 1275-1700 OT Time Calculation (min): 10 min  OT Assessment / Plan / Recommendation History of present illness Joanne Bell is a 53 y.o. 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 This past Tuesday, patient started having fever, cough, nasal congestion/coldlike symptoms along with generalized myalgia. She went to her primary care practitioner this past Thursday, and was given amoxicillin for sinusitis. She did test positive for influenza, however since his symptoms were ongoing for more than 48 hours it was elected not to start treatment with Tamiflu. She claims that since he started amoxicillin it started making her "sick to her stomach"- which she describes as epigastric pain and the urge to have a bowel movement. She fortunately has not had good oral intake because of acute illness. When she saw her primary care practitioner recently, dose of amlodipine was doubled from 5 to10 mg, dose of losartan was unchanged. Early this morning, patient woke up with abdominal pain and thought she needed to have a bowel movement, on standing up she felt lightheaded, and then syncopized. Her husband who was with her, did not notice any seizure-like activity, there was no tongue biting, no urinary or fecal incontinence. Patient was then brought to the emergency room, where she was found to have fever, was found to have significant orthostatic vitals (supine blood pressure and heart rate 120/72, 96-standing blood pressure and heart rate 94/51, 110). Chest x-ray showed possible pneumonia, I was asked to admit this patient for further evaluation and treatment.   Clinical Impression   Pt is Mod I with ADLs and ADL mobility and no further acute OT services indicated  at this time, OT will sign off    OT Assessment  Patient does not need any further OT services    Follow Up Recommendations  Supervision - Intermittent;No OT follow up    Barriers to Discharge  none    Equipment Recommendations  None recommended by OT    Recommendations for Other Services    Frequency       Precautions / Restrictions Precautions Precautions: None Restrictions Weight Bearing Restrictions: No   Pertinent Vitals/Pain No c/o pain No c/o dizziness with sup - sit, sit - stand    ADL  Grooming: Performed;Modified independent Upper Body Bathing: Simulated;Modified independent Lower Body Bathing: Simulated;Modified independent Upper Body Dressing: Performed;Modified independent Lower Body Dressing: Performed;Modified independent Toilet Transfer: Performed;Modified independent Toilet Transfer Method: Sit to Loss adjuster, chartered: Regular height toilet Toileting - Clothing Manipulation and Hygiene: Performed;Modified independent Where Assessed - Toileting Clothing Manipulation and Hygiene: Standing Tub/Shower Transfer: Performed;Modified independent Tub/Shower Transfer Method: Therapist, art: Walk in shower Transfers/Ambulation Related to ADLs: no c/o dizzness ADL Comments: Mod I with ADLs due to slower pace    OT Diagnosis:    OT Problem List:   OT Treatment Interventions:     OT Goals(Current goals can be found in the care plan section) Acute Rehab OT Goals Patient Stated Goal: to go home by wednesday  Visit Information  Last OT Received On: 04/03/13 History of Present Illness: Joanne Bell is a 53 y.o. 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 This past Tuesday, patient started having fever, cough, nasal congestion/coldlike  symptoms along with generalized myalgia. She went to her primary care  practitioner this past Thursday, and was given amoxicillin for sinusitis. She did test positive for influenza, however since his symptoms were ongoing for more than 48 hours it was elected not to start treatment with Tamiflu. She claims that since he started amoxicillin it started making her "sick to her stomach"- which she describes as epigastric pain and the urge to have a bowel movement. She fortunately has not had good oral intake because of acute illness. When she saw her primary care practitioner recently, dose of amlodipine was doubled from 5 to10 mg, dose of losartan was unchanged. Early this morning, patient woke up with abdominal pain and thought she needed to have a bowel movement, on standing up she felt lightheaded, and then syncopized. Her husband who was with her, did not notice any seizure-like activity, there was no tongue biting, no urinary or fecal incontinence. Patient was then brought to the emergency room, where she was found to have fever, was found to have significant orthostatic vitals (supine blood pressure and heart rate 120/72, 96-standing blood pressure and heart rate 94/51, 110). Chest x-ray showed possible pneumonia, I was asked to admit this patient for further evaluation and treatment.       Prior [place] expects to be discharged to:: Private residence Living Arrangements: Spouse/significant other Available Help at Discharge: Family Type of Home: House Home Access: Level entry [place]: Two level;Bed/bath upstairs;1/2 bath on main level Prior Function Level of Independence: Independent Communication Communication: No difficulties Dominant Hand: Right         Vision/Perception Vision - History Baseline Vision: Wears glasses all the time Patient Visual Report: No change from baseline Perception Perception: Within Functional Limits   Cognition  Cognition Arousal/Alertness: Awake/alert Behavior During Therapy: WFL  for tasks assessed/performed Overall Cognitive Status: Within Functional Limits for tasks assessed    Extremity/Trunk Assessment Upper Extremity Assessment Upper Extremity Assessment: Overall WFL for tasks assessed Cervical / Trunk Assessment Cervical / Trunk Assessment: Normal     Mobility Bed Mobility Overal bed mobility: Independent General bed mobility comments: no c/o dizziness sup - sit to EOB Transfers Overall transfer level: Modified independent General transfer comment: Mod I, slower pace          Balance Balance Overall balance assessment: No apparent balance deficits (not formally assessed)   End of Session OT - End of Session Activity Tolerance: Patient tolerated treatment well Patient left: in bed;with call bell/phone within reach  GO     Britt Bottom 04/03/2013, 3:03 PM

## 2013-04-03 NOTE — Progress Notes (Signed)
[facility] PROGRESS NOTE Interim History: 53 y.o. 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 This past Tuesday, patient started having fever, cough, nasal congestion/coldlike symptoms along with generalized myalgia.  She did test positive for influenza, however since his symptoms were ongoing for more than 48 hours it was elected not to start treatment with Tamiflu. She claims that since he started amoxicillin it started making her "sick to her stomach"- which she describes as epigastric pain and the urge to have a bowel movement. She fortunately has not had good oral intake because of acute illness.  Her husband who was with her, did not notice any seizure-like activity, there was no tongue biting, no urinary or fecal incontinence. Patient was then brought to the emergency room, where she was found to have fever, was found to have significant orthostatic vitals (supine blood pressure and heart rate 120/72, 96-standing blood pressure and heart rate 94/51, 110). Chest x-ray showed possible pneumonia, I was asked to admit this patient for further evaluation and treatment.   Assessment/Plan: Vertigo with Orthostatic hypotension/hypokalemia: - Antivert, scopolamine patch, consult OT. - specific gravity 1.022. - improved with IV fluids. Replete electrolytes - Cr improved.  PNA (pneumonia): - agree with rocephin and azithromycin, change to levaquin PO. - tylenol for fevers. She is defervescing - HIV negative  Influenza: - She did test positive for influenza, however since his symptoms were ongoing for more than 48 hours it was elected not to start treatment with Tamiflu.  HYPERTENSION: - cont to hold Given orthostatic hypotension,.   Code Status: full Family Communication: none  Disposition Plan:  inpatient   Consultants:  none  Procedures:  CXR  Antibiotics:  Rocephin and azithro 1.24.2014. (indicate start date, and stop date if known)  HPI/Subjective: Dizziness improved.  Objective: Filed Vitals:   04/03/13 0531 04/03/13 0532 04/03/13 0533 04/03/13 0536  BP: 121/68 107/55 111/69 118/72  Pulse: 90 86 90 92  Temp: 98.9 F (37.2 C)     TempSrc: Oral     Resp: 18     Height:      Weight:      SpO2: 96%  97%     Intake/Output Summary (Last 24 hours) at 04/03/13 0730 Last data filed at 04/03/13 0324  Gross per 24 hour  Intake    480 ml  Output   2200 ml  Net  -1720 ml   Filed Weights   04/01/13 0404 04/02/13 0556 04/03/13 0323  Weight: 70.308 kg (155 lb) 68.357 kg (150 lb 11.2 oz) 68.584 kg (151 lb 3.2 oz)    Exam:  General: Alert, awake, oriented x3, in no acute distress.  HEENT: No bruits, no goiter.  Heart: Regular rate and rhythm, without murmurs, rubs, gallops.  Lungs: Good air movement, clear to auscultation.  Abdomen: Soft, nontender, nondistended, positive bowel sounds.  Neuro: Grossly intact, nonfocal.   Data Reviewed: Basic Metabolic Panel:  Recent Labs Lab 03/28/13 1005 04/01/13 0440 04/01/13 1300 04/02/13 0555  NA 141 139  --  140  K 3.6 3.8  --  3.4*  CL 104 98  --  103  CO2 29 25  --  24  GLUCOSE 86 106*  --  99  BUN 10 18  --  7  CREATININE 0.9 1.05 0.82 0.81  CALCIUM 9.4 8.7  --  8.5   Liver Function Tests:  Recent Labs Lab 04/01/13 0440  AST 43*  ALT 28  ALKPHOS 65  BILITOT <0.2*  PROT 7.2  ALBUMIN 3.4*   No results found for this basename: LIPASE, AMYLASE,  in the last 168 hours No results found for this basename: AMMONIA,  in the last 168 hours CBC:  Recent Labs Lab 04/01/13 0440 04/01/13 1300 04/02/13 0555  WBC 4.9 3.1* 5.0  NEUTROABS 3.2  --   --   HGB 11.7* 10.7* 10.8*  HCT 36.5 33.3* 33.5*  MCV 81.7 81.8 82.5  PLT 236 218 228   Cardiac Enzymes:  Recent Labs Lab 04/01/13 1300  04/01/13 2016 04/01/13 2340  TROPONINI <0.30 <0.30 <0.30   BNP (last 3 results) No results found for this basename: PROBNP,  in the last 8760 hours CBG: No results found for this basename: GLUCAP,  in the last 168 hours  Recent Results (from the past 240 hour(s))  CULTURE, BLOOD (ROUTINE X 2)     Status: None   Collection Time    04/01/13  8:02 AM      Result Value Range Status   Specimen Description BLOOD RIGHT ARM   Final   Special Requests BOTTLES DRAWN AEROBIC AND ANAEROBIC 10CC   Final   Culture  Setup Time     Final   Value: 04/01/2013 14:43     Performed at [facility]   Culture     Final   Value:        BLOOD CULTURE RECEIVED NO GROWTH TO DATE CULTURE WILL BE HELD FOR 5 DAYS BEFORE ISSUING A FINAL NEGATIVE REPORT     Performed at [facility]   Report Status PENDING   Incomplete     Studies: No results found.  Scheduled Meds: . azithromycin  500 mg Oral Daily  . cefTRIAXone (ROCEPHIN)  IV  1 g Intravenous Q24H  . enoxaparin (LOVENOX) injection  40 mg Subcutaneous Q24H  . fluticasone  2 spray Each Nare Daily  . guaiFENesin  600 mg Oral BID  . meclizine  25 mg Oral TID  . scopolamine  1 patch Transdermal Q72H   Continuous Infusions: . sodium chloride 125 mL/hr at 04/03/13 Gonzalez, Inigo Lantigua  [facility] Pager [phone]. If 8PM-8AM, please contact night-coverage at [email or web address], password [facility] 04/03/2013, 7:30 AM  LOS: 2 days

## 2013-04-04 DIAGNOSIS — R55 Syncope and collapse: Secondary | ICD-10-CM

## 2013-04-04 DIAGNOSIS — J111 Influenza due to unidentified influenza virus with other respiratory manifestations: Secondary | ICD-10-CM

## 2013-04-04 DIAGNOSIS — J189 Pneumonia, unspecified organism: Secondary | ICD-10-CM

## 2013-04-04 MED ORDER — POTASSIUM CHLORIDE 20 MEQ/15ML (10%) PO LIQD
40.0000 meq | ORAL | Status: DC
  Administered 2013-04-04: 40 meq via ORAL
  Filled 2013-04-04: qty 30

## 2013-04-04 MED ORDER — MECLIZINE HCL 25 MG PO TABS: 25.0000 mg | ORAL_TABLET | Freq: Three times a day (TID) | ORAL | Status: DC

## 2013-04-04 MED ORDER — LOSARTAN POTASSIUM 50 MG PO TABS
100.0000 mg | ORAL_TABLET | Freq: Every day | ORAL | Status: DC
Start: 2013-04-04 — End: 2013-04-04
  Administered 2013-04-04: 100 mg via ORAL
  Filled 2013-04-04: qty 2

## 2013-04-04 MED ORDER — POTASSIUM CHLORIDE CRYS ER 20 MEQ PO TBCR
40.0000 meq | EXTENDED_RELEASE_TABLET | Freq: Two times a day (BID) | ORAL | Status: DC
  Administered 2013-04-04: 40 meq via ORAL
  Filled 2013-04-04: qty 2

## 2013-04-04 MED ORDER — LEVOFLOXACIN 750 MG PO TABS: 750.0000 mg | ORAL_TABLET | Freq: Every day | ORAL | Status: DC

## 2013-04-04 NOTE — Discharge Summary (Signed)
Physician Discharge Summary  Joanne Bell [id number] DOB: Oct 23, 1960 DOA: 04/01/2013  PCP: [name], MD  Admit date: 04/01/2013 Discharge date: 04/04/2013  Time spent: 35 minutes  Recommendations for Outpatient Follow-up:  1. Follow up with PCP in 2 week. Check Bp not on a diuretic at home. 2. Cardiology in 1 week.  Discharge Diagnoses:  Principal Problem:   Syncope Active Problems:   HYPERTENSION   Pacemaker-[employer]   PNA (pneumonia)   Orthostatic hypotension   Influenza   Discharge Condition: stable  Diet recommendation: heart healthy  Filed Weights   04/02/13 0556 04/03/13 0323 04/04/13 0537  Weight: 68.357 kg (150 lb 11.2 oz) 68.584 kg (151 lb 3.2 oz) 67.767 kg (149 lb 6.4 oz)    History of present illness:  53 y.o. 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 This past Tuesday, patient started having fever, cough, nasal congestion/coldlike symptoms along with generalized myalgia. She went to her primary care practitioner this past Thursday, and was given amoxicillin for sinusitis. She did test positive for influenza, however since his symptoms were ongoing for more than 48 hours it was elected not to start treatment with Tamiflu. She claims that since he started amoxicillin it started making her "sick to her stomach"- which she describes as epigastric pain and the urge to have a bowel movement. She fortunately has not had good oral intake because of acute illness. When she saw her primary care practitioner recently, dose of amlodipine was doubled from 5 to10 mg, dose of losartan was unchanged. Early this morning, patient woke up with abdominal pain and thought she needed to have a bowel movement, on standing up she felt lightheaded, and then syncopized. Her husband who was with her, did not notice any seizure-like activity, there was no tongue  biting, no urinary or fecal incontinence. Patient was then brought to the emergency room, where she was found to have fever, was found to have significant orthostatic vitals (supine blood pressure and heart rate 120/72, 96-standing blood pressure and heart rate 94/51, 110). Chest x-ray showed possible pneumonia, I was asked to admit this patient for further evaluation and treatment.   Hospital Course:  Vertigo with Orthostatic hypotension/hypokalemia:  - Antivert, scopolamine patch, consult OT. Vertigo improve.  - specific gravity 1.022. Started IV fluid and orthostatisis resolved. - Replete electrolytes  - Cr improved.   PNA (pneumonia):  - Started on rocephin and azithromycin, change to levaquin PO. Will cont for 7 days total. - tylenol for fevers.  - HIV negative   Influenza:  - She did test positive for influenza, however since his symptoms were ongoing for more than 48 hours it was elected not to start treatment with Tamiflu.   HYPERTENSION:  - cont to hold Given orthostatic hypotension,.  - resume home medications.  Procedures:  Echo  Consultations:  none  Discharge Exam: Filed Vitals:   04/04/13 0537  BP: 136/79  Pulse: 61  Temp: 98.6 F (37 C)  Resp: 20    General: A&O x3 Cardiovascular: RRR Respiratory: good air movement CTA B/L  Discharge Instructions      Discharge Orders   Future Appointments Provider Department Dept Phone   05/19/2013 9:10 AM [facility] [phone]   Future Orders Complete By Expires   Diet - low sodium heart healthy  As directed    Increase activity slowly  As  directed        Medication List    STOP taking these medications       amoxicillin 875 MG tablet  Commonly known as:  AMOXIL      TAKE these medications       amLODipine 10 MG tablet  Commonly known as:  NORVASC  Take 1 tablet (10 mg total) by mouth daily.     chlorpheniramine-HYDROcodone 10-8 MG/5ML Lqcr   Commonly known as:  TUSSIONEX PENNKINETIC ER  Take 5 mLs by mouth at bedtime as needed for cough.     levofloxacin 750 MG tablet  Commonly known as:  LEVAQUIN  Take 1 tablet (750 mg total) by mouth daily.     losartan 100 MG tablet  Commonly known as:  COZAAR  TAKE 1 TABLET (100 MG TOTAL) BY MOUTH DAILY.     meclizine 25 MG tablet  Commonly known as:  ANTIVERT  Take 1 tablet (25 mg total) by mouth 3 (three) times daily.     multivitamin,tx-minerals tablet  Take 1 tablet by mouth daily.       No Known Allergies Follow-up Information   Follow up with FRY,STEPHEN A, MD In 1 week. (hospital follow up)    Specialty:  Family Medicine   Contact information:   [place] [address] [phone]       Follow up with [name], MD In 1 week. (hospital follow up)    Specialty:  Cardiology   Contact information:   [address] [place] [address] [phone]        The results of significant diagnostics from this hospitalization (including imaging, microbiology, ancillary and laboratory) are listed below for reference.    Significant Diagnostic Studies: Dg Chest 2 View  04/01/2013   CLINICAL DATA:  Loss of consciousness.  EXAM: CHEST  2 VIEW  COMPARISON:  Chest radiograph performed 02/17/2011  FINDINGS: The lungs are well-aerated. Mild bibasilar opacities may reflect atelectasis or possibly mild pneumonia. There is no evidence of pleural effusion or pneumothorax.  The heart is mildly enlarged. A pacemaker is seen at the left chest wall, with leads ending at the right atrium and right ventricle. No acute osseous abnormalities are seen.  IMPRESSION: Mild bibasilar airspace opacities may reflect atelectasis or possibly mild pneumonia.   Electronically Signed   By: Garald Balding M.D.   On: 04/01/2013 05:17    Microbiology: Recent Results (from the past 240 hour(s))  CULTURE, BLOOD (ROUTINE X 2)     Status: None   Collection Time     04/01/13  8:02 AM      Result Value Range Status   Specimen Description BLOOD RIGHT ARM   Final   Special Requests BOTTLES DRAWN AEROBIC AND ANAEROBIC 10CC   Final   Culture  Setup Time     Final   Value: 04/01/2013 14:43     Performed at [facility]   Culture     Final   Value:        BLOOD CULTURE RECEIVED NO GROWTH TO DATE CULTURE WILL BE HELD FOR 5 DAYS BEFORE ISSUING A FINAL NEGATIVE REPORT     Performed at [facility]   Report Status PENDING   Incomplete  CULTURE, BLOOD (ROUTINE X 2)     Status: None   Collection Time    04/01/13  8:12 AM      Result Value Range Status   Specimen Description BLOOD RIGHT HAND  Final   Special Requests BOTTLES DRAWN AEROBIC AND ANAEROBIC 10C   Final   Culture  Setup Time     Final   Value: 04/01/2013 14:42     Performed at [facility]   Culture     Final   Value:        BLOOD CULTURE RECEIVED NO GROWTH TO DATE CULTURE WILL BE HELD FOR 5 DAYS BEFORE ISSUING A FINAL NEGATIVE REPORT     Performed at [facility]   Report Status PENDING   Incomplete     Labs: Basic Metabolic Panel:  Recent Labs Lab 03/28/13 1005 04/01/13 0440 04/01/13 1300 04/02/13 0555  NA 141 139  --  140  K 3.6 3.8  --  3.4*  CL 104 98  --  103  CO2 29 25  --  24  GLUCOSE 86 106*  --  99  BUN 10 18  --  7  CREATININE 0.9 1.05 0.82 0.81  CALCIUM 9.4 8.7  --  8.5   Liver Function Tests:  Recent Labs Lab 04/01/13 0440  AST 43*  ALT 28  ALKPHOS 65  BILITOT <0.2*  PROT 7.2  ALBUMIN 3.4*   No results found for this basename: LIPASE, AMYLASE,  in the last 168 hours No results found for this basename: AMMONIA,  in the last 168 hours CBC:  Recent Labs Lab 04/01/13 0440 04/01/13 1300 04/02/13 0555  WBC 4.9 3.1* 5.0  NEUTROABS 3.2  --   --   HGB 11.7* 10.7* 10.8*  HCT 36.5 33.3* 33.5*  MCV 81.7 81.8 82.5  PLT 236 218 228   Cardiac Enzymes:  Recent Labs Lab 04/01/13 1300 04/01/13 2016 04/01/13 2340  TROPONINI  <0.30 <0.30 <0.30   BNP: BNP (last 3 results) No results found for this basename: PROBNP,  in the last 8760 hours CBG: No results found for this basename: GLUCAP,  in the last 168 hours     Signed:  Charlynne Cousins  [facility] 04/04/2013, 8:44 AM

## 2013-04-07 LAB — CULTURE, BLOOD (ROUTINE X 2)
CULTURE: NO GROWTH
Culture: NO GROWTH

## 2013-04-10 ENCOUNTER — Ambulatory Visit (INDEPENDENT_AMBULATORY_CARE_PROVIDER_SITE_OTHER): Payer: BC Managed Care – PPO | Admitting: Family Medicine

## 2013-04-10 ENCOUNTER — Encounter: Payer: Self-pay | Admitting: Family Medicine

## 2013-04-10 ENCOUNTER — Ambulatory Visit: Payer: BC Managed Care – PPO | Admitting: Family Medicine

## 2013-04-10 VITALS — BP 118/66 | HR 71 | Temp 98.6°F | Ht 64.0 in | Wt 150.0 lb

## 2013-04-10 DIAGNOSIS — J189 Pneumonia, unspecified organism: Secondary | ICD-10-CM

## 2013-04-10 DIAGNOSIS — I442 Atrioventricular block, complete: Secondary | ICD-10-CM

## 2013-04-10 DIAGNOSIS — I10 Essential (primary) hypertension: Secondary | ICD-10-CM

## 2013-04-10 NOTE — Progress Notes (Signed)
   Subjective:    Patient ID: Joanne Bell, female    DOB: 03/15/1960, 53 y.o.   MRN: [id number]  HPI Here to follow up a hospital stay from 04-01-13 to 04-04-13 for pneumonia and dehydration. She was seen in the clinic prior to admission with a positive influenza test and was put on Amoxicillin. She got worse however and continued to cough and have fever. She had a syncopal spell the day of admission and this turned out to be form dehydration. She was rehydrated and given IV antibiotics. These were then switched to oral Levaquin which she finished several days ago. She feels fine today except for some residual fatigue. No more syncope. She has a mild dry cough.    Review of Systems  Constitutional: Negative.   Respiratory: Negative.   Cardiovascular: Negative.   Neurological: Negative.        Objective:   Physical Exam  Constitutional: She is oriented to person, place, and time. She appears well-developed and well-nourished.  Cardiovascular: Normal rate, regular rhythm, normal heart sounds and intact distal pulses.   Pulmonary/Chest: Effort normal and breath sounds normal.  Neurological: She is alert and oriented to person, place, and time.          Assessment & Plan:  She has recovered from pneumonia. Her BP is stable. She will see Dr. [name] tomorrow

## 2013-04-10 NOTE — Progress Notes (Signed)
Pre visit review using our clinic review tool, if applicable. No additional management support is needed unless otherwise documented below in the visit note. 

## 2013-04-11 ENCOUNTER — Encounter: Payer: BC Managed Care – PPO | Admitting: Physician Assistant

## 2013-05-19 ENCOUNTER — Ambulatory Visit (INDEPENDENT_AMBULATORY_CARE_PROVIDER_SITE_OTHER): Payer: BC Managed Care – PPO | Admitting: *Deleted

## 2013-05-19 DIAGNOSIS — I442 Atrioventricular block, complete: Secondary | ICD-10-CM

## 2013-05-19 DIAGNOSIS — Z950 Presence of cardiac pacemaker: Secondary | ICD-10-CM

## 2013-05-20 LAB — MDC_IDC_ENUM_SESS_TYPE_REMOTE
Brady Statistic AP VP Percent: 0 %
Brady Statistic AP VS Percent: 0 %
Brady Statistic AS VP Percent: 100 %
Brady Statistic AS VS Percent: 0 %
Brady Statistic RA Percent Paced: 0 %
Brady Statistic RV Percent Paced: 100 %
Date Time Interrogation Session: 20150313015605
Lead Channel Impedance Value: 424 Ohm
Lead Channel Setting Sensing Sensitivity: 0.9 mV
MDC IDC MSMT BATTERY VOLTAGE: 3 V
MDC IDC MSMT LEADCHNL RA SENSING INTR AMPL: 2.9126
MDC IDC MSMT LEADCHNL RV IMPEDANCE VALUE: 472 Ohm
MDC IDC SET LEADCHNL RA PACING AMPLITUDE: 2 V
MDC IDC SET LEADCHNL RV PACING AMPLITUDE: 2.5 V
MDC IDC SET LEADCHNL RV PACING PULSEWIDTH: 0.4 ms
Zone Setting Detection Interval: 350 ms
Zone Setting Detection Interval: 370 ms

## 2013-06-26 ENCOUNTER — Ambulatory Visit: Payer: BC Managed Care – PPO | Admitting: Family Medicine

## 2013-06-28 ENCOUNTER — Ambulatory Visit: Payer: BC Managed Care – PPO | Admitting: Family Medicine

## 2013-06-28 ENCOUNTER — Ambulatory Visit (INDEPENDENT_AMBULATORY_CARE_PROVIDER_SITE_OTHER): Payer: BC Managed Care – PPO | Admitting: Family Medicine

## 2013-06-28 ENCOUNTER — Encounter: Payer: Self-pay | Admitting: Family Medicine

## 2013-06-28 VITALS — BP 140/80 | Temp 98.1°F | Ht 64.0 in | Wt 153.0 lb

## 2013-06-28 DIAGNOSIS — I10 Essential (primary) hypertension: Secondary | ICD-10-CM

## 2013-06-28 DIAGNOSIS — R609 Edema, unspecified: Secondary | ICD-10-CM

## 2013-06-28 NOTE — Progress Notes (Signed)
   Subjective:    Patient ID: Joanne Bell, female    DOB: Jul 19, 1960, 53 y.o.   MRN: [id number]  HPI Here asking about swelling in the feet. When we saw her last January her BP had gone up so we increased her Amlodipine to 10 mg daily. This has worked well to keep her BP under control. Then about 5 weeks ago she noticed some swelling in both feet that would come up during the day and go down overnight. No chest pain or SOB. She realized she drank very little water at that time and she started to make a real effort to drink more water every day and to consume less sodium in her diet. The swelling then went away fairly quickly and has stayed away for the past 2 weeks.    Review of Systems  Constitutional: Negative.   Respiratory: Negative.   Cardiovascular: Positive for leg swelling. Negative for chest pain and palpitations.       Objective:   Physical Exam  Constitutional: She appears well-developed and well-nourished.  Cardiovascular: Normal rate, regular rhythm, normal heart sounds and intact distal pulses.   Pulmonary/Chest: Effort normal and breath sounds normal.  Musculoskeletal: She exhibits no edema.          Assessment & Plan:  Her edema probably resulted from the higher dose of Amlodipine. She has controlled this by drinking more water and using less sodium, so we will keep things as they are. Recheck if the edema returns

## 2013-06-28 NOTE — Progress Notes (Signed)
Pre visit review using our clinic review tool, if applicable. No additional management support is needed unless otherwise documented below in the visit note. 

## 2013-07-06 ENCOUNTER — Encounter: Payer: Self-pay | Admitting: *Deleted

## 2013-07-21 ENCOUNTER — Encounter: Payer: Self-pay | Admitting: Internal Medicine

## 2013-08-22 ENCOUNTER — Telehealth: Payer: Self-pay | Admitting: Cardiology

## 2013-08-22 ENCOUNTER — Encounter: Payer: BC Managed Care – PPO | Admitting: *Deleted

## 2013-08-22 NOTE — Telephone Encounter (Signed)
LMOVM reminding pt to send remote transmission.   

## 2013-08-23 ENCOUNTER — Encounter: Payer: Self-pay | Admitting: Cardiology

## 2013-08-28 ENCOUNTER — Ambulatory Visit (INDEPENDENT_AMBULATORY_CARE_PROVIDER_SITE_OTHER): Payer: BC Managed Care – PPO | Admitting: *Deleted

## 2013-08-28 DIAGNOSIS — I442 Atrioventricular block, complete: Secondary | ICD-10-CM

## 2013-08-29 NOTE — Progress Notes (Signed)
Remote pacemaker transmission.   

## 2013-09-04 LAB — MDC_IDC_ENUM_SESS_TYPE_REMOTE
Brady Statistic AP VP Percent: 4.05 %
Brady Statistic AP VS Percent: 0 %
Brady Statistic AS VP Percent: 95.95 %
Brady Statistic AS VS Percent: 0 %
Brady Statistic RV Percent Paced: 100 %
Date Time Interrogation Session: 20150622225404
Lead Channel Impedance Value: 424 Ohm
Lead Channel Setting Pacing Amplitude: 2 V
Lead Channel Setting Pacing Pulse Width: 0.4 ms
Lead Channel Setting Sensing Sensitivity: 0.9 mV
MDC IDC MSMT BATTERY VOLTAGE: 3 V
MDC IDC MSMT LEADCHNL RA SENSING INTR AMPL: 2.9996
MDC IDC MSMT LEADCHNL RV IMPEDANCE VALUE: 480 Ohm
MDC IDC SET LEADCHNL RV PACING AMPLITUDE: 2.5 V
MDC IDC STAT BRADY RA PERCENT PACED: 4.05 %
Zone Setting Detection Interval: 350 ms
Zone Setting Detection Interval: 370 ms

## 2013-09-07 ENCOUNTER — Other Ambulatory Visit: Payer: Self-pay

## 2013-09-07 DIAGNOSIS — Z1231 Encounter for screening mammogram for malignant neoplasm of breast: Secondary | ICD-10-CM

## 2013-09-12 ENCOUNTER — Encounter: Payer: Self-pay | Admitting: Cardiology

## 2013-09-15 ENCOUNTER — Ambulatory Visit: Payer: BC Managed Care – PPO

## 2013-09-20 ENCOUNTER — Encounter: Payer: Self-pay | Admitting: Internal Medicine

## 2013-10-19 ENCOUNTER — Ambulatory Visit: Payer: BC Managed Care – PPO

## 2013-12-04 ENCOUNTER — Telehealth: Payer: Self-pay | Admitting: Cardiology

## 2013-12-04 ENCOUNTER — Ambulatory Visit (INDEPENDENT_AMBULATORY_CARE_PROVIDER_SITE_OTHER): Payer: BC Managed Care – PPO | Admitting: *Deleted

## 2013-12-04 DIAGNOSIS — I442 Atrioventricular block, complete: Secondary | ICD-10-CM

## 2013-12-04 NOTE — Telephone Encounter (Signed)
LMOVM reminding pt to send remote transmission.   

## 2013-12-05 NOTE — Progress Notes (Signed)
Remote pacemaker transmission.   

## 2013-12-07 LAB — MDC_IDC_ENUM_SESS_TYPE_REMOTE
Battery Voltage: 2.99 V
Brady Statistic AP VP Percent: 5 %
Brady Statistic AS VP Percent: 95 %
Brady Statistic AS VS Percent: 0 %
Lead Channel Impedance Value: 416 Ohm
Lead Channel Sensing Intrinsic Amplitude: 3.6951
Lead Channel Setting Pacing Amplitude: 2 V
Lead Channel Setting Pacing Amplitude: 2.5 V
Lead Channel Setting Pacing Pulse Width: 0.4 ms
Lead Channel Setting Sensing Sensitivity: 0.9 mV
MDC IDC MSMT LEADCHNL RV IMPEDANCE VALUE: 472 Ohm
MDC IDC SESS DTM: 20150929164004
MDC IDC STAT BRADY AP VS PERCENT: 0 %
MDC IDC STAT BRADY RA PERCENT PACED: 5 %
MDC IDC STAT BRADY RV PERCENT PACED: 100 %
Zone Setting Detection Interval: 350 ms
Zone Setting Detection Interval: 370 ms

## 2013-12-26 ENCOUNTER — Encounter: Payer: Self-pay | Admitting: Cardiology

## 2014-01-09 ENCOUNTER — Other Ambulatory Visit: Payer: Self-pay | Admitting: Internal Medicine

## 2014-01-11 ENCOUNTER — Other Ambulatory Visit: Payer: Self-pay | Admitting: Internal Medicine

## 2014-01-12 ENCOUNTER — Encounter: Payer: Self-pay | Admitting: Internal Medicine

## 2014-02-14 ENCOUNTER — Encounter (HOSPITAL_COMMUNITY): Payer: Self-pay | Admitting: Internal Medicine

## 2014-03-14 ENCOUNTER — Other Ambulatory Visit: Payer: Self-pay | Admitting: Internal Medicine

## 2014-03-22 ENCOUNTER — Encounter: Payer: Self-pay | Admitting: Internal Medicine

## 2014-03-22 ENCOUNTER — Ambulatory Visit (INDEPENDENT_AMBULATORY_CARE_PROVIDER_SITE_OTHER): Payer: BC Managed Care – PPO | Admitting: Internal Medicine

## 2014-03-22 VITALS — BP 132/84 | HR 60 | Ht 64.0 in | Wt 159.0 lb

## 2014-03-22 DIAGNOSIS — Z45018 Encounter for adjustment and management of other part of cardiac pacemaker: Secondary | ICD-10-CM

## 2014-03-22 DIAGNOSIS — I442 Atrioventricular block, complete: Secondary | ICD-10-CM

## 2014-03-22 LAB — MDC_IDC_ENUM_SESS_TYPE_INCLINIC
Brady Statistic AS VS Percent: 0 %
Brady Statistic RA Percent Paced: 5.48 %
Brady Statistic RV Percent Paced: 100 %
Lead Channel Impedance Value: 472 Ohm
Lead Channel Pacing Threshold Amplitude: 1 V
Lead Channel Pacing Threshold Amplitude: 1 V
Lead Channel Pacing Threshold Pulse Width: 0.4 ms
Lead Channel Pacing Threshold Pulse Width: 0.4 ms
Lead Channel Sensing Intrinsic Amplitude: 2.9996
Lead Channel Setting Pacing Amplitude: 2.5 V
Lead Channel Setting Pacing Pulse Width: 0.4 ms
MDC IDC MSMT BATTERY VOLTAGE: 2.99 V
MDC IDC MSMT LEADCHNL RA IMPEDANCE VALUE: 424 Ohm
MDC IDC SESS DTM: 20160114143709
MDC IDC SET LEADCHNL RA PACING AMPLITUDE: 2 V
MDC IDC SET LEADCHNL RV SENSING SENSITIVITY: 0.9 mV
MDC IDC STAT BRADY AP VP PERCENT: 5.48 %
MDC IDC STAT BRADY AP VS PERCENT: 0 %
MDC IDC STAT BRADY AS VP PERCENT: 94.52 %
Zone Setting Detection Interval: 350 ms
Zone Setting Detection Interval: 370 ms

## 2014-03-22 NOTE — Patient Instructions (Signed)

## 2014-03-22 NOTE — Progress Notes (Signed)
Electrophysiology Office Note   Date:  03/22/2014   ID:  Joanne Bell, DOB 20-Aug-1960, MRN [id number]  PCP:  [name], MD  Cardiologist:    Primary Electrophysiologist: sk   Chief Complaint  Patient presents with  . Follow-up     History of Present Illness: Joanne Bell is a 54 y.o. female who presents today for electrophysiology  followup for a pacemaker implanted December 2012 for complete heart block  Her workup for underlying causes was negative including MRI echo Lyme titers and chest x-ray these were all normal.      The patient denies chest pain, shortness of breath, nocturnal dyspnea, orthopnea or peripheral edema. There have been no palpitations, lightheadedness or syncope.  She has no impairment of exercise tolerance      Today, she denies symptoms of palpitations, chest pain, shortness of breath, orthopnea, PND, lower extremity edema, claudication, dizziness, presyncope, syncope, bleeding, or neurologic sequela. The patient is tolerating medications without difficulties and is otherwise without complaint today.    Past Medical History  Diagnosis Date  . Atrioventricular block, complete     narrow QRS  . Hypertension   . Fibroids, intramural   . Bilateral ovarian cysts   . Colon polyps   . Pacemaker     Medtronic REVO   Past Surgical History  Procedure Laterality Date  . Myomectomy  1998    per Dr. Garwin Brothers  . Endometrial ablation  2004  . Colonoscopy  2010    benign polyps, repeat in 5 yrs  . Permanent pacemaker insertion Left 02/16/2011    Procedure: PERMANENT PACEMAKER INSERTION;  Surgeon: Deboraha Sprang, MD;  Location: [facility] CATH LAB;  Service: Cardiovascular;  Laterality: Left;     Current Outpatient Prescriptions  Medication Sig Dispense Refill  . amLODipine (NORVASC) 10 MG tablet Take 1 tablet (10 mg total) by mouth daily. 30 tablet 11  . losartan (COZAAR) 100 MG tablet TAKE 1 TABLET (100 MG TOTAL) BY MOUTH DAILY. 30 tablet 0     No current facility-administered medications for this visit.    Allergies:   Review of patient's allergies indicates no known allergies.   Social History:  The patient  reports that she has never smoked. She has never used smokeless tobacco. She reports that she does not drink alcohol or use illicit drugs.   Family History:  [name] family history includes Arthritis in her father; Depression in her mother; Diabetes in her father; Hyperlipidemia in her father; Hypertension in her father; Stroke in her father.    ROS:  Please see the history of present illness.   Otherwise, review of systems is positive for noen.   All other systems are reviewed and negative.    PHYSICAL EXAM: VS:  BP 132/84 mmHg  Pulse 60  Ht 5\' 4"  (1.626 m)  Wt 159 lb (72.122 kg)  BMI 27.28 kg/m2 , BMI Body mass index is 27.28 kg/(m^2). GEN: Well nourished, well developed, in no acute distress HEENT: normal Neck: no JVD, carotid bruits, or masses Cardiac:  RRR; no murmurs, rubs, or gallops,no edema  Respiratory:  clear to auscultation bilaterally, normal work of breathing GI: soft, nontender, nondistended, + BS MS: no deformity or atrophy Skin: warm and dry,   device pocket is well healed Neuro:  Strength and sensation are intact Psych: euthymic mood, full affect  EKG:  EKG is ordered today. The ekg ordered today shows P-synchronous/ AV  pacing     Device interrogation is reviewed  today in detail.  See PaceArt for details.   Recent Labs: 04/01/2013: ALT 28 04/02/2013: BUN 7; Creatinine 0.81; Hemoglobin 10.8*; Platelets 228; Potassium 3.4*; Sodium 140    Lipid Panel     Component Value Date/Time   CHOL 168 08/06/2010 1023   TRIG 160.0* 08/06/2010 1023   HDL 47.20 08/06/2010 1023   CHOLHDL 4 08/06/2010 1023   VLDL 32.0 08/06/2010 1023   LDLCALC 89 08/06/2010 1023     Wt Readings from Last 3 Encounters:  03/22/14 159 lb (72.122 kg)  06/28/13 153 lb (69.4 kg)  04/10/13 150 lb (68.04 kg)      Other studies Reviewed:     ASSESSMENT AND PLAN:  1.  Complete heart block  Pacemaker-Medtronic  [name] device was interrogated.  The information was reviewed. No changes were made in the programming.       Current medicines are reviewed at length with the patient today.   The patient is does not have concerns regarding her medicines.  The following changes were made today:   Orders Placed This Encounter  Procedures  . Implantable device check     Disposition:   FU with    2 month     2    Signed, [name], MD  03/22/2014 2:54 PM      [place] [address] [phone] (office) [phone] (fax)

## 2014-03-23 IMAGING — CR DG CHEST 2V
2 series · 2 of 2 positions shown · non-contrast
Comparison: Chest radiograph performed 02/17/2011

CLINICAL DATA: Loss of consciousness.

EXAM:
CHEST  2 VIEW

[w chest pa]
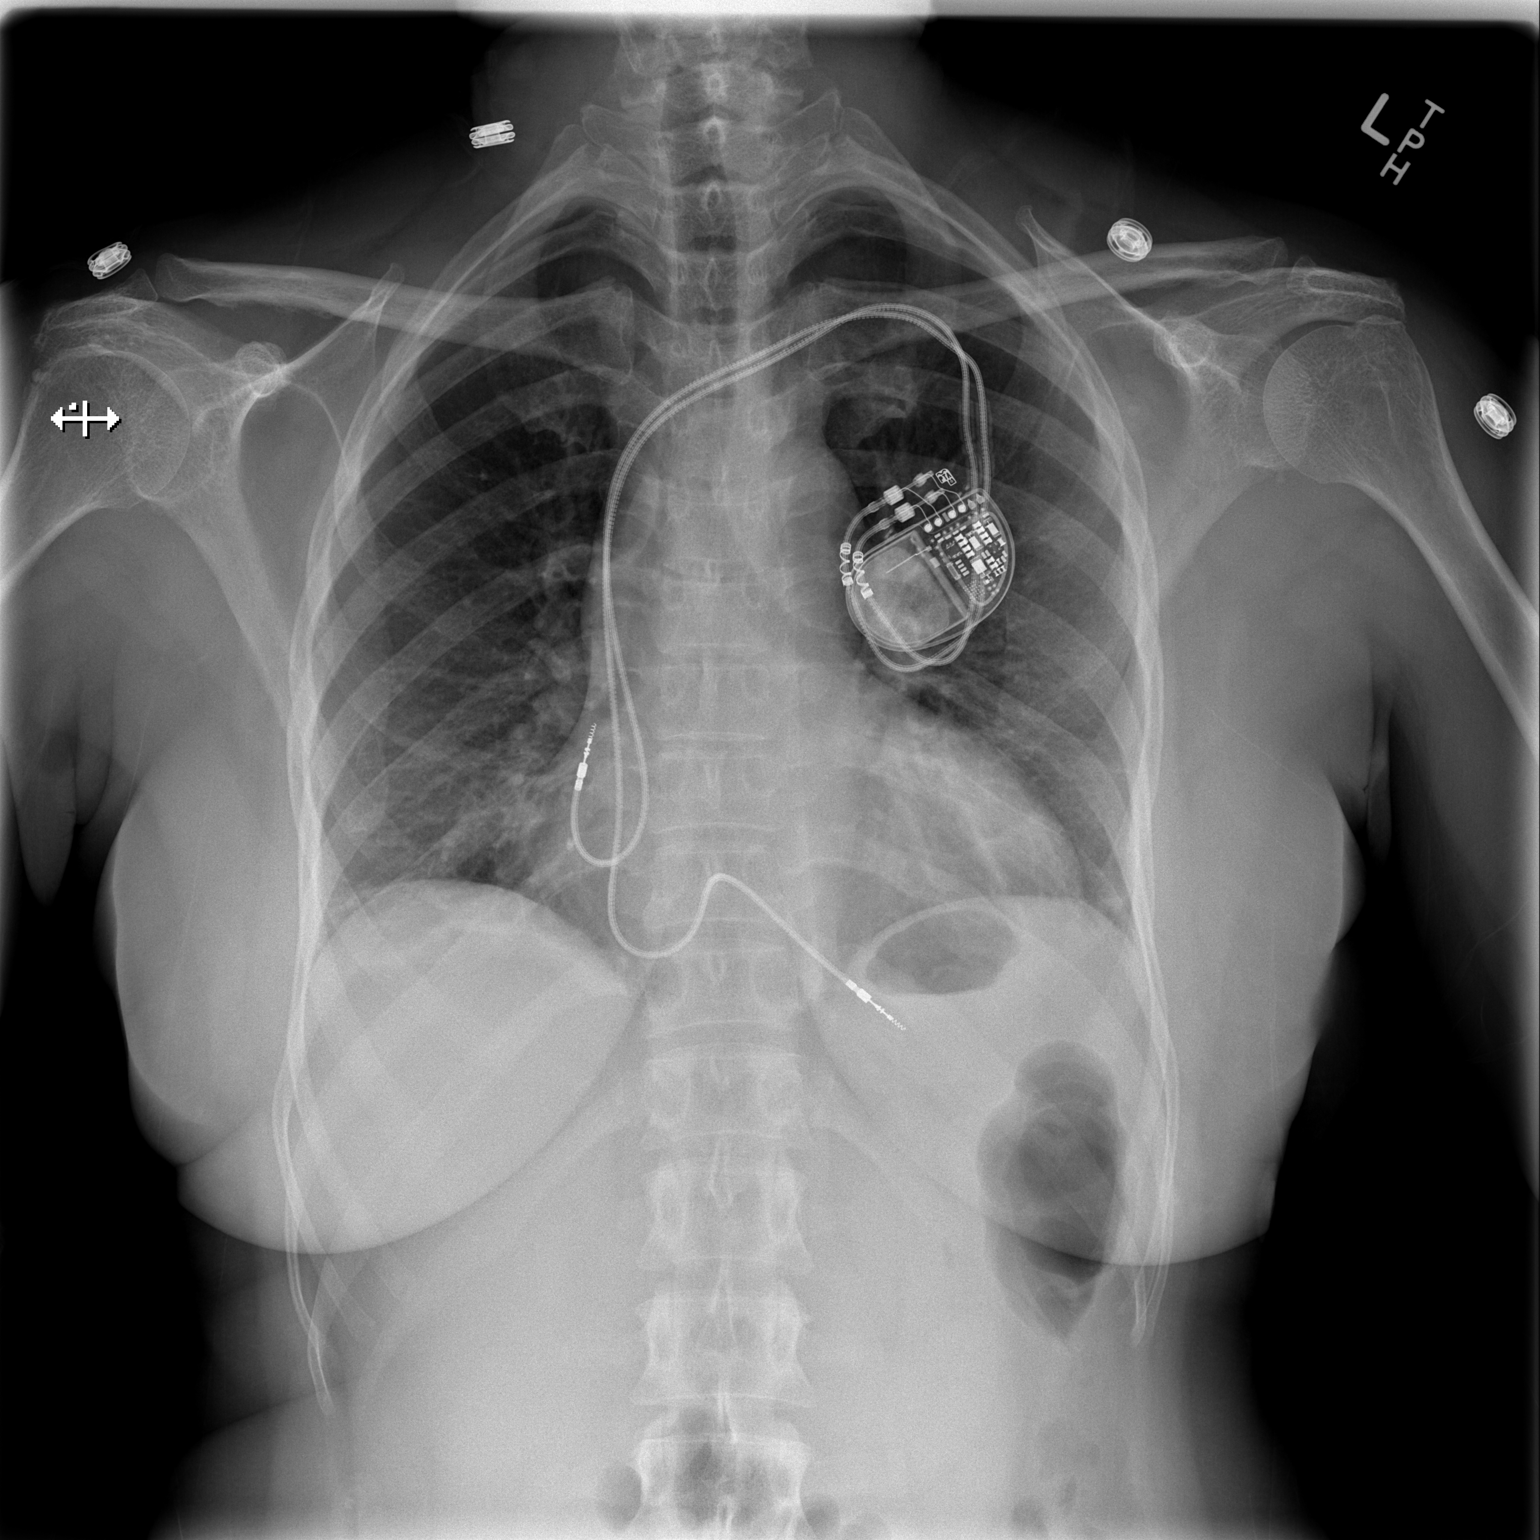

[w chest lat]
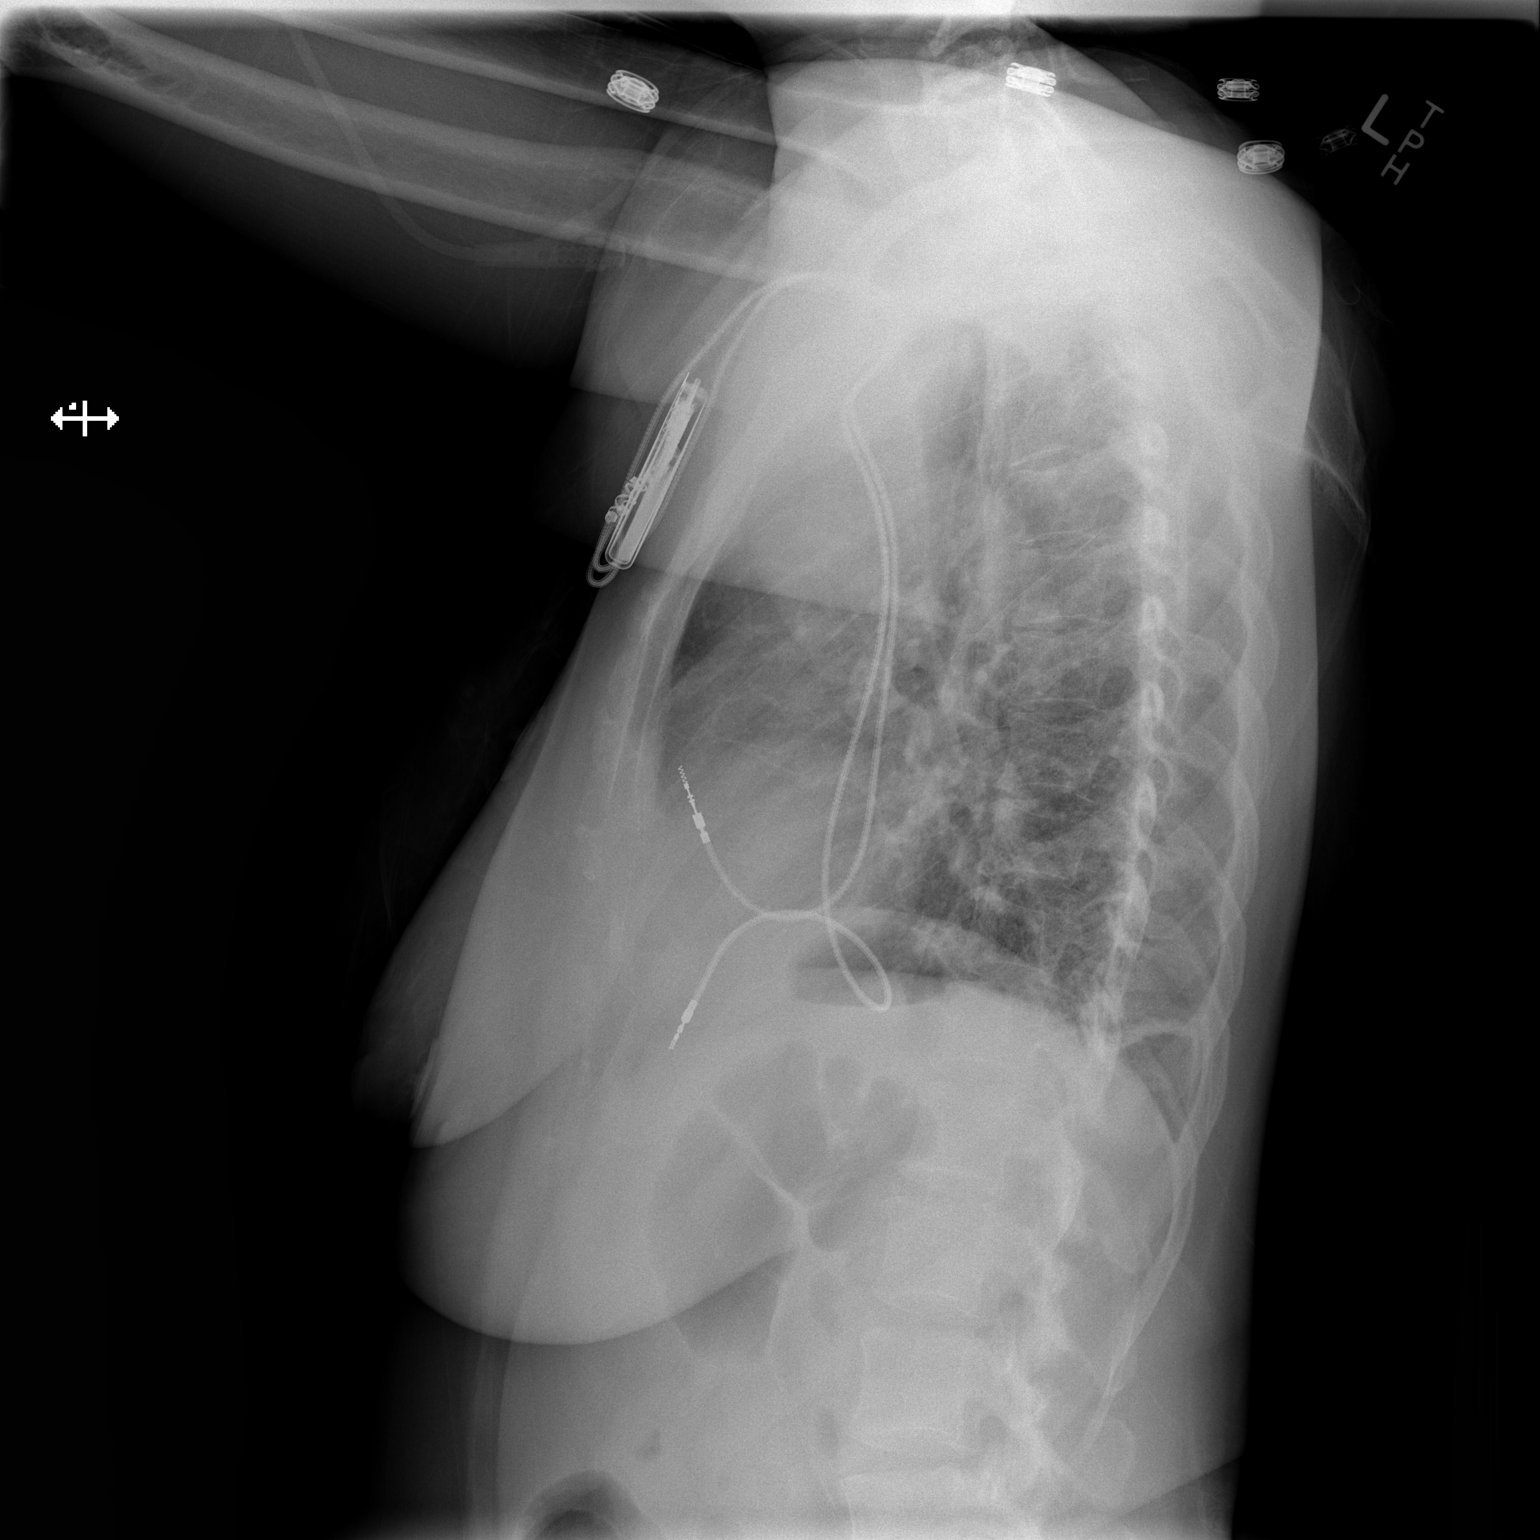

[2 of 2 positions shown; findings below may reference images not displayed]

FINDINGS: The lungs are well-aerated. Mild bibasilar opacities may reflect
atelectasis or possibly mild pneumonia. There is no evidence of
pleural effusion or pneumothorax.

The heart is mildly enlarged. A pacemaker is seen at the left chest
wall, with leads ending at the right atrium and right ventricle. No
acute osseous abnormalities are seen.
IMPRESSION: Mild bibasilar airspace opacities may reflect atelectasis or
possibly mild pneumonia.

## 2014-03-28 ENCOUNTER — Encounter: Payer: Self-pay | Admitting: Internal Medicine

## 2014-04-09 ENCOUNTER — Other Ambulatory Visit: Payer: Self-pay | Admitting: Family Medicine

## 2014-04-14 ENCOUNTER — Other Ambulatory Visit: Payer: Self-pay | Admitting: Internal Medicine

## 2014-04-16 ENCOUNTER — Encounter: Payer: Self-pay | Admitting: Family Medicine

## 2014-04-16 ENCOUNTER — Ambulatory Visit (INDEPENDENT_AMBULATORY_CARE_PROVIDER_SITE_OTHER): Payer: BLUE CROSS/BLUE SHIELD | Admitting: Family Medicine

## 2014-04-16 VITALS — BP 125/89 | HR 95 | Temp 99.0°F | Ht 64.0 in | Wt 159.0 lb

## 2014-04-16 DIAGNOSIS — J0100 Acute maxillary sinusitis, unspecified: Secondary | ICD-10-CM

## 2014-04-16 DIAGNOSIS — B029 Zoster without complications: Secondary | ICD-10-CM

## 2014-04-16 MED ORDER — HYDROCODONE-HOMATROPINE 5-1.5 MG/5ML PO SYRP: 5.0000 mL | ORAL_SOLUTION | ORAL | Status: DC | PRN

## 2014-04-16 MED ORDER — VALACYCLOVIR HCL 1 G PO TABS
1000.0000 mg | ORAL_TABLET | Freq: Three times a day (TID) | ORAL | Status: DC
Start: 2014-04-16 — End: 2014-08-24

## 2014-04-16 MED ORDER — AZITHROMYCIN 250 MG PO TABS: ORAL_TABLET | ORAL | Status: DC

## 2014-04-16 NOTE — Progress Notes (Signed)
Pre visit review using our clinic review tool, if applicable. No additional management support is needed unless otherwise documented below in the visit note. 

## 2014-04-16 NOTE — Progress Notes (Signed)
   Subjective:    Patient ID: Joanne Bell, female    DOB: 1961/02/11, 54 y.o.   MRN: [id number]  HPI Here for 2 things. First 8 days ago she developed sinus pressure, PND, ST, and a dry cough. Then 3 days ago she also developed a rash on the back which burns and itches.   Review of Systems  Constitutional: Negative.   HENT: Positive for congestion, postnasal drip and sinus pressure.   Eyes: Negative.   Respiratory: Positive for cough.   Cardiovascular: Negative.   Skin: Positive for rash.       Objective:   Physical Exam  Constitutional: She appears well-developed and well-nourished.  HENT:  Right Ear: External ear normal.  Left Ear: External ear normal.  Nose: Nose normal.  Mouth/Throat: Oropharynx is clear and moist.  Eyes: Conjunctivae are normal.  Pulmonary/Chest: Effort normal and breath sounds normal.  Lymphadenopathy:    She has no cervical adenopathy.  Skin:  There are 2 patches of red vesicles on the center of the back and one patch below the left breast           Assessment & Plan:  Treat the sinusitis with a Zpack, and treat the shingles with Valtrex.

## 2014-06-21 ENCOUNTER — Ambulatory Visit (INDEPENDENT_AMBULATORY_CARE_PROVIDER_SITE_OTHER): Payer: BLUE CROSS/BLUE SHIELD | Admitting: *Deleted

## 2014-06-21 ENCOUNTER — Telehealth: Payer: Self-pay | Admitting: Cardiology

## 2014-06-21 DIAGNOSIS — I442 Atrioventricular block, complete: Secondary | ICD-10-CM

## 2014-06-21 NOTE — Telephone Encounter (Signed)
LMOVM reminding pt to send remote transmission.   

## 2014-06-22 DIAGNOSIS — I442 Atrioventricular block, complete: Secondary | ICD-10-CM | POA: Diagnosis not present

## 2014-06-22 NOTE — Progress Notes (Signed)
Remote pacemaker transmission.   

## 2014-06-24 LAB — MDC_IDC_ENUM_SESS_TYPE_REMOTE
Battery Voltage: 2.99 V
Brady Statistic AS VP Percent: 95.95 %
Brady Statistic RA Percent Paced: 4.05 %
Lead Channel Impedance Value: 424 Ohm
Lead Channel Impedance Value: 472 Ohm
Lead Channel Setting Pacing Amplitude: 2 V
Lead Channel Setting Sensing Sensitivity: 0.9 mV
MDC IDC MSMT LEADCHNL RA SENSING INTR AMPL: 3.434 mV
MDC IDC SESS DTM: 20160415123021
MDC IDC SET LEADCHNL RV PACING AMPLITUDE: 2.5 V
MDC IDC SET LEADCHNL RV PACING PULSEWIDTH: 0.4 ms
MDC IDC STAT BRADY AP VP PERCENT: 4.05 %
MDC IDC STAT BRADY AP VS PERCENT: 0 %
MDC IDC STAT BRADY AS VS PERCENT: 0 %
MDC IDC STAT BRADY RV PERCENT PACED: 100 %
Zone Setting Detection Interval: 350 ms
Zone Setting Detection Interval: 370 ms

## 2014-07-17 ENCOUNTER — Encounter: Payer: Self-pay | Admitting: Cardiology

## 2014-07-19 ENCOUNTER — Encounter: Payer: Self-pay | Admitting: Internal Medicine

## 2014-08-24 ENCOUNTER — Ambulatory Visit (INDEPENDENT_AMBULATORY_CARE_PROVIDER_SITE_OTHER): Payer: BLUE CROSS/BLUE SHIELD | Admitting: Family Medicine

## 2014-08-24 ENCOUNTER — Encounter: Payer: Self-pay | Admitting: Family Medicine

## 2014-08-24 VITALS — BP 128/89 | HR 82 | Temp 98.6°F | Ht 64.0 in | Wt 160.0 lb

## 2014-08-24 DIAGNOSIS — K219 Gastro-esophageal reflux disease without esophagitis: Secondary | ICD-10-CM | POA: Diagnosis not present

## 2014-08-24 MED ORDER — OMEPRAZOLE 40 MG PO CPDR: 40.0000 mg | DELAYED_RELEASE_CAPSULE | Freq: Every day | ORAL | Status: DC

## 2014-08-24 NOTE — Progress Notes (Signed)
   Subjective:    Patient ID: Joanne Bell, female    DOB: [birth year], 54 y.o.   MRN: [id number]  HPI Here for 3 months of heartburn, indigestion, and feeling like her mouth fills with acid at times. No trouble swallowing. TUMS helps. It is often worse when lying down. No SOB.    Review of Systems  Constitutional: Negative.   Respiratory: Negative.   Cardiovascular: Negative.   Gastrointestinal: Negative.        Objective:   Physical Exam  Constitutional: She appears well-developed and well-nourished. No distress.  Neck: No thyromegaly present.  Cardiovascular: Normal rate, regular rhythm, normal heart sounds and intact distal pulses.   Pulmonary/Chest: No respiratory distress. She has no wheezes. She has no rales. She exhibits no tenderness.  Abdominal: Soft. Bowel sounds are normal. She exhibits no distension and no mass. There is no tenderness. There is no rebound and no guarding.  Lymphadenopathy:    She has no cervical adenopathy.          Assessment & Plan:  This is GERD. Try Prilosec 40 mg daily.

## 2014-08-24 NOTE — Progress Notes (Signed)
Pre visit review using our clinic review tool, if applicable. No additional management support is needed unless otherwise documented below in the visit note. 

## 2014-09-20 ENCOUNTER — Encounter: Payer: Self-pay | Admitting: Family Medicine

## 2014-09-20 ENCOUNTER — Telehealth: Payer: Self-pay | Admitting: Family Medicine

## 2014-09-20 ENCOUNTER — Ambulatory Visit (INDEPENDENT_AMBULATORY_CARE_PROVIDER_SITE_OTHER): Payer: BLUE CROSS/BLUE SHIELD | Admitting: Family Medicine

## 2014-09-20 VITALS — BP 118/82 | HR 79 | Temp 98.6°F | Ht 64.0 in | Wt 160.0 lb

## 2014-09-20 DIAGNOSIS — J019 Acute sinusitis, unspecified: Secondary | ICD-10-CM

## 2014-09-20 MED ORDER — AZITHROMYCIN 250 MG PO TABS: ORAL_TABLET | ORAL | Status: DC

## 2014-09-20 NOTE — Telephone Encounter (Signed)
Pt will need office visit.

## 2014-09-20 NOTE — Telephone Encounter (Signed)
Pt has been scheduled.  °

## 2014-09-20 NOTE — Telephone Encounter (Signed)
Pt has stuffy head, right eye sinus pressure, congested ,poss sinus inf, and would like to know if dr frywill send in rx for this. Pt states mostly in the am, and will eventually clear up around end of day.  [facility] [address]

## 2014-09-20 NOTE — Progress Notes (Signed)
   Subjective:    Patient ID: Joanne Bell, female    DOB: 11/10/1960, 54 y.o.   MRN: [id number]  HPI Here for 3 days of sinus pressure, PND, and HA. No fever or cough. Drinking fluids.    Review of Systems  Constitutional: Negative.   HENT: Positive for congestion, postnasal drip and sinus pressure.   Eyes: Negative.   Respiratory: Negative.   Cardiovascular: Negative.        Objective:   Physical Exam  Constitutional: She appears well-developed and well-nourished.  HENT:  Right Ear: External ear normal.  Left Ear: External ear normal.  Nose: Nose normal.  Mouth/Throat: Oropharynx is clear and moist.  Eyes: Conjunctivae are normal.  Neck: No thyromegaly present.  Cardiovascular: Normal rate, regular rhythm, normal heart sounds and intact distal pulses.   Pulmonary/Chest: Effort normal and breath sounds normal.  Lymphadenopathy:    She has no cervical adenopathy.          Assessment & Plan:  Sinusitis. Treat with a Zpack and Mucinex

## 2014-09-20 NOTE — Progress Notes (Signed)
Pre visit review using our clinic review tool, if applicable. No additional management support is needed unless otherwise documented below in the visit note. 

## 2014-09-24 ENCOUNTER — Ambulatory Visit (INDEPENDENT_AMBULATORY_CARE_PROVIDER_SITE_OTHER): Payer: BLUE CROSS/BLUE SHIELD | Admitting: *Deleted

## 2014-09-24 DIAGNOSIS — I442 Atrioventricular block, complete: Secondary | ICD-10-CM

## 2014-09-24 NOTE — Progress Notes (Signed)
Remote pacemaker transmission.   

## 2014-09-25 LAB — CUP PACEART REMOTE DEVICE CHECK
Battery Voltage: 2.99 V
Brady Statistic AP VS Percent: 0 %
Brady Statistic AS VS Percent: 0 %
Brady Statistic RA Percent Paced: 3.82 %
Brady Statistic RV Percent Paced: 100 %
Lead Channel Impedance Value: 432 Ohm
Lead Channel Setting Sensing Sensitivity: 0.9 mV
MDC IDC MSMT LEADCHNL RA SENSING INTR AMPL: 3.043 mV
MDC IDC MSMT LEADCHNL RV IMPEDANCE VALUE: 472 Ohm
MDC IDC SESS DTM: 20160718130832
MDC IDC SET LEADCHNL RA PACING AMPLITUDE: 2 V
MDC IDC SET LEADCHNL RV PACING AMPLITUDE: 2.5 V
MDC IDC SET LEADCHNL RV PACING PULSEWIDTH: 0.4 ms
MDC IDC STAT BRADY AP VP PERCENT: 3.82 %
MDC IDC STAT BRADY AS VP PERCENT: 96.18 %
Zone Setting Detection Interval: 350 ms
Zone Setting Detection Interval: 370 ms

## 2014-10-17 ENCOUNTER — Encounter: Payer: Self-pay | Admitting: Cardiology

## 2014-10-24 ENCOUNTER — Encounter: Payer: Self-pay | Admitting: Internal Medicine

## 2014-11-14 ENCOUNTER — Telehealth: Payer: Self-pay | Admitting: *Deleted

## 2014-11-14 NOTE — Telephone Encounter (Signed)
PLEASE NOTE: All timestamps contained within this report are represented as [place] Standard Time. CONFIDENTIALTY NOTICE: This fax transmission is intended only for the addressee. It contains information that is legally privileged, confidential or otherwise protected from use or disclosure. If you are not the intended recipient, you are strictly prohibited from reviewing, disclosing, copying using or disseminating any of this information or taking any action in reliance on or regarding this information. If you have received this fax in error, please notify us immediately by telephone so that we can arrange for its return to [place]. Phone: [phone], Toll-Free: [phone], Fax: [phone] Page: 1 of 2 Call Id: [id number] [facility] Night - Client [place] Patient Name: Joanne Bell [facility] Gender: Female DOB: [birth year] Age: 54 Y 3 M 24 Bell Return Phone Number: [phone] (Primary) Address: City/State/Zip: [place] Client [facility] Night - Client Client Site [facility] - Night Physician Alysia Penna Contact Type Call Call Type Triage / Clinical Relationship To Patient Self Return Phone Number [phone] (Primary) Chief Complaint Foot Pain Initial Comment caller states she has foot pain PreDisposition Go to Urgent Care/Walk-In Clinic Nurse Assessment Nurse: Evangeline Gula, RN, Jenny Reichmann Date/Time Eilene Ghazi Time): 11/10/2014 1:35:21 PM Confirm and document reason for call. If symptomatic, describe symptoms. ---caller states she has foot pain, on the inside of arch of the foot. Has the patient traveled out of the country within the last 30 days? ---No Does the patient require triage? ---Yes Related visit to physician within the last 2 weeks? ---No Does the PT have any chronic conditions? (i.e. diabetes, asthma, etc.) ---Yes List chronic conditions. ---HTN Guidelines Guideline Title Affirmed  Question Affirmed Notes Nurse Date/Time Eilene Ghazi Time) Foot Pain Foot pain (all triage questions negative) Evangeline Gula, RN, Jenny Reichmann 11/10/2014 1:37:14 PM Disp. Time Eilene Ghazi Time) Disposition Final User 11/10/2014 10:06:46 AM Send To Clinical Follow Up Jeral Fruit 11/10/2014 10:29:45 AM Attempt made - message left Evangeline Gula, RN, Jenny Reichmann 11/10/2014 10:59:38 AM FINAL ATTEMPT MADE - message left Evangeline Gula RN, Jenny Reichmann 11/10/2014 1:45:25 PM Home Care Yes Evangeline Gula, RN, [place] Understands: Yes Disagree/Comply: Comply Care Advice Given Per Guideline PLEASE NOTE: All timestamps contained within this report are represented as [place] Standard Time. CONFIDENTIALTY NOTICE: This fax transmission is intended only for the addressee. It contains information that is legally privileged, confidential or otherwise protected from use or disclosure. If you are not the intended recipient, you are strictly prohibited from reviewing, disclosing, copying using or disseminating any of this information or taking any action in reliance on or regarding this information. If you have received this fax in error, please notify us immediately by telephone so that we can arrange for its return to [place]. Phone: [phone], Toll-Free: [phone], Fax: [phone] Page: 2 of 2 Call Id: [id number] Care Advice Given Per Guideline HOME CARE: You should be able to treat this at home. * For pain relief, take acetaminophen, ibuprofen, or naproxen. PAIN MEDICINES: * Use the lowest amount that makes your pain feel better. NAPROXEN (E.G., ALEVE): * Take 220 mg (one 220 mg pill) by mouth every 8 hours as needed. You may take 440 mg (two 220 mg pills) for your first dose. CALL BACK IF: * Swelling, redness, or fever occur * Severe pain not relieved by pain medication * Pain persists over 7 days * You become worse. CARE ADVICE given per Foot Pain (Adult) guideline. After Care Instructions Given Call Event Type User Date / Time  Description Comments User: Guy Begin,  B[facility] Date/Time Eilene Ghazi Time): 11/10/2014 1:35:20 PM Caller has returned the phone call to the nurse and was transferred.

## 2014-11-14 NOTE — Telephone Encounter (Signed)
PLEASE NOTE: All timestamps contained within this report are represented as [place] Standard Time. CONFIDENTIALTY NOTICE: This fax transmission is intended only for the addressee. It contains information that is legally privileged, confidential or otherwise protected from use or disclosure. If you are not the intended recipient, you are strictly prohibited from reviewing, disclosing, copying using or disseminating any of this information or taking any action in reliance on or regarding this information. If you have received this fax in error, please notify us immediately by telephone so that we can arrange for its return to [place]. Phone: [phone], Toll-Free: [phone], Fax: [phone] Page: 1 of 1 Call Id: [id number] [facility] Night - Client [place] Patient Name: [facility] Gender: Female DOB: 07/22/1960 Age: 54 Y 3 M 24 D Return Phone Number: [phone] (Primary) Address: City/State/Zip: [place] Client [facility] Night - Client Client Site [facility] - Night Physician [place], [place] Type Call Call Type Triage / Clinical Relationship To Patient Self Return Phone Number [phone] (Primary) Chief Complaint Foot Pain Initial Comment caller states she has foot pain Nurse Assessment Guidelines Guideline Title Affirmed Question Affirmed Notes Nurse Date/Time ([place] Time) Disp. Time Eilene Ghazi Time) Disposition Final User 11/10/2014 10:06:46 AM Send To Clinical Follow Up Jeral Fruit 11/10/2014 10:29:45 AM Attempt made - message left Evangeline Gula, RN, Jenny Reichmann 11/10/2014 10:59:38 AM FINAL ATTEMPT MADE - message left Yes Evangeline Gula, RN, Jenny Reichmann After Care Instructions Given Call Event Type User Date / Time Description

## 2014-11-24 ENCOUNTER — Other Ambulatory Visit: Payer: Self-pay | Admitting: Family Medicine

## 2014-11-30 ENCOUNTER — Ambulatory Visit (INDEPENDENT_AMBULATORY_CARE_PROVIDER_SITE_OTHER): Payer: BLUE CROSS/BLUE SHIELD | Admitting: Family Medicine

## 2014-11-30 ENCOUNTER — Encounter: Payer: Self-pay | Admitting: Family Medicine

## 2014-11-30 VITALS — BP 112/81 | HR 73 | Temp 98.4°F | Ht 64.0 in | Wt 160.0 lb

## 2014-11-30 DIAGNOSIS — M25561 Pain in right knee: Secondary | ICD-10-CM

## 2014-11-30 DIAGNOSIS — Z23 Encounter for immunization: Secondary | ICD-10-CM | POA: Diagnosis not present

## 2014-11-30 DIAGNOSIS — M25562 Pain in left knee: Secondary | ICD-10-CM

## 2014-11-30 NOTE — Progress Notes (Signed)
Pre visit review using our clinic review tool, if applicable. No additional management support is needed unless otherwise documented below in the visit note. 

## 2014-11-30 NOTE — Progress Notes (Signed)
   Subjective:    Patient ID: Joanne Bell, female    DOB: 1960/11/16, 54 y.o.   MRN: [id number]  HPI Here for several months of pain in both knees, particularly when she goes up or down steps. No locking or giving way. No swelling. She has not done anything for this.    Review of Systems  Constitutional: Negative.   Musculoskeletal: Positive for arthralgias and gait problem. Negative for back pain and joint swelling.       Objective:   Physical Exam  Constitutional: She appears well-developed and well-nourished.  Musculoskeletal:  Both knees have a lot of crepitus, but ROM is full. No swelling. No tenderness.           Assessment & Plan:  This is osteoarthritis. Losing weight would take some stress off er knees. Try Aleve prn.

## 2014-12-24 ENCOUNTER — Ambulatory Visit (INDEPENDENT_AMBULATORY_CARE_PROVIDER_SITE_OTHER): Payer: BLUE CROSS/BLUE SHIELD | Admitting: *Deleted

## 2014-12-24 DIAGNOSIS — I442 Atrioventricular block, complete: Secondary | ICD-10-CM | POA: Diagnosis not present

## 2014-12-24 NOTE — Progress Notes (Signed)
Remote pacemaker transmission.   

## 2014-12-25 ENCOUNTER — Encounter: Payer: Self-pay | Admitting: Cardiology

## 2014-12-25 LAB — CUP PACEART REMOTE DEVICE CHECK
Battery Voltage: 2.99 V
Brady Statistic AP VP Percent: 5.97 %
Brady Statistic AS VP Percent: 94.03 %
Brady Statistic RA Percent Paced: 5.97 %
Date Time Interrogation Session: 20161017130303
Implantable Lead Implant Date: 20121210
Implantable Lead Location: 753859
Implantable Lead Location: 753860
Lead Channel Impedance Value: 424 Ohm
Lead Channel Setting Pacing Amplitude: 2 V
Lead Channel Setting Pacing Pulse Width: 0.4 ms
MDC IDC LEAD IMPLANT DT: 20121210
MDC IDC LEAD MODEL: 5086
MDC IDC LEAD MODEL: 5086
MDC IDC MSMT LEADCHNL RA SENSING INTR AMPL: 2.652 mV
MDC IDC MSMT LEADCHNL RV IMPEDANCE VALUE: 472 Ohm
MDC IDC SET LEADCHNL RV PACING AMPLITUDE: 2.5 V
MDC IDC SET LEADCHNL RV SENSING SENSITIVITY: 0.9 mV
MDC IDC STAT BRADY AP VS PERCENT: 0 %
MDC IDC STAT BRADY AS VS PERCENT: 0 %
MDC IDC STAT BRADY RV PERCENT PACED: 100 %

## 2015-02-20 ENCOUNTER — Encounter: Payer: Self-pay | Admitting: Family Medicine

## 2015-02-20 ENCOUNTER — Ambulatory Visit (INDEPENDENT_AMBULATORY_CARE_PROVIDER_SITE_OTHER): Payer: BLUE CROSS/BLUE SHIELD | Admitting: Family Medicine

## 2015-02-20 VITALS — BP 100/69 | HR 92 | Temp 98.9°F | Ht 64.0 in | Wt 154.0 lb

## 2015-02-20 DIAGNOSIS — J019 Acute sinusitis, unspecified: Secondary | ICD-10-CM

## 2015-02-20 MED ORDER — HYDROCODONE-HOMATROPINE 5-1.5 MG/5ML PO SYRP: 5.0000 mL | ORAL_SOLUTION | ORAL | Status: DC | PRN

## 2015-02-20 MED ORDER — AZITHROMYCIN 250 MG PO TABS
ORAL_TABLET | ORAL | Status: DC
Start: 2015-02-20 — End: 2015-03-13

## 2015-02-20 NOTE — Progress Notes (Signed)
   Subjective:    Patient ID: Joanne Bell, female    DOB: Nov 17, 1960, 54 y.o.   MRN: [id number]  HPI Here for 3 weeks of sinus pressure, PND, and a dry cough. No fever. On Mucinex.   Review of Systems  Constitutional: Negative.   HENT: Positive for congestion, postnasal drip and sinus pressure. Negative for ear pain.   Eyes: Negative.   Respiratory: Positive for cough.        Objective:   Physical Exam  Constitutional: She appears well-developed and well-nourished.  HENT:  Right Ear: External ear normal.  Left Ear: External ear normal.  Nose: Nose normal.  Mouth/Throat: Oropharynx is clear and moist.  Eyes: Conjunctivae are normal.  Pulmonary/Chest: Effort normal and breath sounds normal.  Lymphadenopathy:    She has no cervical adenopathy.          Assessment & Plan:  Sinusitis, treat with a Zpack

## 2015-02-20 NOTE — Progress Notes (Signed)
Pre visit review using our clinic review tool, if applicable. No additional management support is needed unless otherwise documented below in the visit note. 

## 2015-03-12 ENCOUNTER — Telehealth: Payer: Self-pay | Admitting: Family Medicine

## 2015-03-12 ENCOUNTER — Ambulatory Visit: Payer: BLUE CROSS/BLUE SHIELD | Admitting: Family Medicine

## 2015-03-12 NOTE — Telephone Encounter (Signed)
Patient has an appointment with Dr Sarajane Jews 03/13/15

## 2015-03-12 NOTE — Telephone Encounter (Signed)
PLEASE NOTE: All timestamps contained within this report are represented as [place] Standard Time. CONFIDENTIALTY NOTICE: This fax transmission is intended only for the addressee. It contains information that is legally privileged, confidential or otherwise protected from use or disclosure. If you are not the intended recipient, you are strictly prohibited from reviewing, disclosing, copying using or disseminating any of this information or taking any action in reliance on or regarding this information. If you have received this fax in error, please notify us immediately by telephone so that we can arrange for its return to [place]. Phone: [phone], Toll-Free: [phone], Fax: [phone] Page: 1 of 1 Call Id: [id number] [facility] Day - Client [place] Patient Name: Joanne Bell DOB: August 31, 1960 Initial Comment Caller states she is flying on Friday. She's having sinus congestion. What can she take. Nurse Assessment Nurse: Marcelline Deist, RN, Lynda Date/Time (Eastern Time): 03/12/2015 10:03:33 AM Confirm and document reason for call. If symptomatic, describe symptoms. ---Caller states she is flying on Friday. She's having sinus congestion. What can she take? Can feel the drainage. No fever. Has a cough. Has the patient traveled out of the country within the last 30 days? ---Not Applicable Does the patient have any new or worsening symptoms? ---Yes Will a triage be completed? ---Yes Related visit to physician within the last 2 weeks? ---No Does the PT have any chronic conditions? (i.e. diabetes, asthma, etc.) ---Yes List chronic conditions. ---on BP rx Is this a behavioral health or substance abuse call? ---No Guidelines Guideline Title Affirmed Question Affirmed Notes Sinus Pain or Congestion Lots of coughing Final Disposition User See PCP When Office is Open (within 3 days) Marcelline Deist, RN, Kermit Balo Comments Caller states she  has had symptoms since Sunday with coughing d/t drainage trickling down throat. She has an hour long flight on Friday. Referrals REFERRED TO PCP OFFICE Disagree/Comply: Comply

## 2015-03-13 ENCOUNTER — Encounter: Payer: Self-pay | Admitting: Family Medicine

## 2015-03-13 ENCOUNTER — Ambulatory Visit (INDEPENDENT_AMBULATORY_CARE_PROVIDER_SITE_OTHER): Payer: BLUE CROSS/BLUE SHIELD | Admitting: Family Medicine

## 2015-03-13 VITALS — BP 138/82 | Temp 98.5°F | Wt 154.3 lb

## 2015-03-13 DIAGNOSIS — J019 Acute sinusitis, unspecified: Secondary | ICD-10-CM | POA: Diagnosis not present

## 2015-03-13 MED ORDER — AMOXICILLIN-POT CLAVULANATE 875-125 MG PO TABS: 1.0000 | ORAL_TABLET | Freq: Two times a day (BID) | ORAL | Status: DC

## 2015-03-13 NOTE — Progress Notes (Signed)
   Subjective:    Patient ID: Joanne Bell, female    DOB: Mar 03, 1961, 55 y.o.   MRN: [id number]  HPI Here for recurrent sinus congestion, HA. And PND. No fever or cough. She was here several weeks ago for similar sx and was given a Zpack. She says she recovered completely at that time.   Review of Systems  Constitutional: Negative.   HENT: Positive for congestion, postnasal drip and sinus pressure. Negative for ear pain and sore throat.   Eyes: Negative.   Respiratory: Negative.        Objective:   Physical Exam  Constitutional: She appears well-developed and well-nourished.  HENT:  Right Ear: External ear normal.  Left Ear: External ear normal.  Nose: Nose normal.  Mouth/Throat: Oropharynx is clear and moist.  Eyes: Conjunctivae are normal.  Neck: No thyromegaly present.  Pulmonary/Chest: Effort normal and breath sounds normal.  Lymphadenopathy:    She has no cervical adenopathy.          Assessment & Plan:  Sinusitis, treat with Augmentin.

## 2015-03-13 NOTE — Progress Notes (Signed)
Pre visit review using our clinic review tool, if applicable. No additional management support is needed unless otherwise documented below in the visit note. 

## 2015-04-11 ENCOUNTER — Ambulatory Visit (INDEPENDENT_AMBULATORY_CARE_PROVIDER_SITE_OTHER): Payer: BLUE CROSS/BLUE SHIELD | Admitting: Internal Medicine

## 2015-04-11 ENCOUNTER — Encounter: Payer: Self-pay | Admitting: Internal Medicine

## 2015-04-11 VITALS — BP 130/88 | HR 74 | Ht 64.0 in | Wt 154.0 lb

## 2015-04-11 DIAGNOSIS — R0600 Dyspnea, unspecified: Secondary | ICD-10-CM

## 2015-04-11 DIAGNOSIS — I442 Atrioventricular block, complete: Secondary | ICD-10-CM | POA: Diagnosis not present

## 2015-04-11 DIAGNOSIS — Z950 Presence of cardiac pacemaker: Secondary | ICD-10-CM | POA: Diagnosis not present

## 2015-04-11 LAB — CUP PACEART INCLINIC DEVICE CHECK
Battery Voltage: 2.97 V
Brady Statistic AS VP Percent: 94.34 %
Date Time Interrogation Session: 20170202173444
Implantable Lead Location: 753860
Lead Channel Pacing Threshold Amplitude: 0.5 V
Lead Channel Pacing Threshold Pulse Width: 0.4 ms
Lead Channel Setting Pacing Amplitude: 2 V
Lead Channel Setting Pacing Pulse Width: 0.4 ms
Lead Channel Setting Sensing Sensitivity: 0.9 mV
MDC IDC LEAD IMPLANT DT: 20121210
MDC IDC LEAD IMPLANT DT: 20121210
MDC IDC LEAD LOCATION: 753859
MDC IDC LEAD MODEL: 5086
MDC IDC LEAD MODEL: 5086
MDC IDC MSMT LEADCHNL RA IMPEDANCE VALUE: 416 Ohm
MDC IDC MSMT LEADCHNL RA PACING THRESHOLD AMPLITUDE: 0.5 V
MDC IDC MSMT LEADCHNL RA PACING THRESHOLD PULSEWIDTH: 0.4 ms
MDC IDC MSMT LEADCHNL RA SENSING INTR AMPL: 2.608 mV
MDC IDC MSMT LEADCHNL RV IMPEDANCE VALUE: 480 Ohm
MDC IDC SET LEADCHNL RV PACING AMPLITUDE: 2.5 V
MDC IDC STAT BRADY AP VP PERCENT: 5.66 %
MDC IDC STAT BRADY AP VS PERCENT: 0 %
MDC IDC STAT BRADY AS VS PERCENT: 0 %
MDC IDC STAT BRADY RA PERCENT PACED: 5.66 %
MDC IDC STAT BRADY RV PERCENT PACED: 100 %

## 2015-04-11 NOTE — Progress Notes (Signed)
Electrophysiology Office Note   Date:  04/11/2015   ID:  Joanne Bell, DOB [birth year], MRN [id number]  PCP:  Joanne Morale, MD  Cardiologist:    Primary Electrophysiologist: sk   Chief Complaint  Patient presents with  . Follow-up    AV Block     History of Present Illness: Joanne Bell is a 55 y.o. female who presents today for electrophysiology  followup for a pacemaker implanted December 2012 for complete heart block  Her workup for underlying causes was negative including MRI echo Lyme titers and chest x-ray these were all normal.   The patient denies chest pain, shortness of breath, nocturnal dyspnea, orthopnea or peripheral edema. There have been no palpitations, lightheadedness or syncope.  She has no impairment of exercise tolerance  She has significant snoring sleep disordered breathing and daytime somnolence. This is been getting worse. She denies significant dyspnea on exertion   Past Medical History  Diagnosis Date  . Atrioventricular block, complete (HCC)     narrow QRS  . Hypertension   . Fibroids, intramural   . Bilateral ovarian cysts   . Colon polyps   . Pacemaker     Medtronic REVO   Past Surgical History  Procedure Laterality Date  . Myomectomy  1998    per Dr. Garwin Brothers  . Endometrial ablation  2004  . Colonoscopy  2010    benign polyps, repeat in 5 yrs  . Permanent pacemaker insertion Left 02/16/2011    Procedure: PERMANENT PACEMAKER INSERTION;  Surgeon: Deboraha Sprang, MD;  Location: [facility] CATH LAB;  Service: Cardiovascular;  Laterality: Left;     Current Outpatient Prescriptions  Medication Sig Dispense Refill  . amLODipine (NORVASC) 10 MG tablet TAKE 1 TABLET (10 MG TOTAL) BY MOUTH DAILY. 30 tablet 3  . losartan (COZAAR) 100 MG tablet TAKE 1 TABLET (100 MG TOTAL) BY MOUTH DAILY. 30 tablet 10  . vitamin E 100 UNIT capsule Take 100 Units by mouth daily.     No current facility-administered medications for this visit.     Allergies:   Review of patient's allergies indicates no known allergies.   Social History:  The patient  reports that she has never smoked. She has never used smokeless tobacco. She reports that she does not drink alcohol or use illicit drugs.   Family History:  [name] family history includes Arthritis in her father; Depression in her mother; Diabetes in her father; Hyperlipidemia in her father; Hypertension in her father; Stroke in her father.    ROS:  Please see the history of present illness.   Otherwise, review of systems is positive for noen.   All other systems are reviewed and negative.    PHYSICAL EXAM: VS:  BP 130/88 mmHg  Pulse 74  Ht 5\' 4"  (1.626 m)  Wt 154 lb (69.854 kg)  BMI 26.42 kg/m2  SpO2 95% , BMI Body mass index is 26.42 kg/(m^2). GEN: Well nourished, well developed, in no acute distress HEENT: normal Neck: no JVD, carotid bruits, or masses Device pocket well healed; without hematoma or erythema.  There is no tethering Cardiac:  RRR; no murmurs, rubs, or gallops,no edema  Respiratory:  clear to auscultation bilaterally, normal work of breathing GI: soft, nontender, nondistended, + BS MS: no deformity or atrophy Skin: warm and dry,    Neuro:  Strength and sensation are intact Psych: euthymic mood, full affect  EKG:  EKG is ordered today. The ekg ordered today shows P-synchronous/ AV  pacing  Device interrogation is reviewed today in detail.  See PaceArt for details.   Recent Labs: No results found for requested labs within last 365 days.    Lipid Panel     Component Value Date/Time   CHOL 168 08/06/2010 1023   TRIG 160.0* 08/06/2010 1023   HDL 47.20 08/06/2010 1023   CHOLHDL 4 08/06/2010 1023   VLDL 32.0 08/06/2010 1023   LDLCALC 89 08/06/2010 1023     Wt Readings from Last 3 Encounters:  04/11/15 154 lb (69.854 kg)  03/13/15 154 lb 4.8 oz (69.99 kg)  02/20/15 154 lb (69.854 kg)     Other studies Reviewed:     ASSESSMENT  AND PLAN:  Complete heart block  Hypertension  sleep disordered breathing and daytime somnolence  Hypokalemia  Anemia  Pacemaker-Medtronic  [name] device was interrogated.  The information was reviewed. No changes were made in the programming.     Blood pressure is well-controlled  We will look at LV function given the fact that she is 100% ventricularly paced. Basically she does not have significant symptoms at this point. In addition, with her hypertension, we will undertake a sleep study. She has significant sleep disordered breathing and daytime somnolence.  Review of her laboratories demonstrate a potassium of 3.4 when last checked 1/15. We will check a metabolic profile. Also at that time her hemoglobin had gone from the 11th--10's. We will recheck a CBC  Current medicines are reviewed at length with the patient today.   The patient is does not have concerns regarding her medicines.  The following changes were made today:      Disposition:   FU with    12 month       Signed, [name], MD  04/11/2015 5:12 PM      [address] [place] [address] [phone] (office) [phone] (fax)

## 2015-04-11 NOTE — Patient Instructions (Signed)
Medication Instructions: - Your physician recommends that you continue on your current medications as directed. Please refer to the Current Medication list given to you today.  Labwork: - Your physician recommends that you return for lab work- the day of your echo- BMET/ CBC  Procedures/Testing: - Your physician has requested that you have an echocardiogram. Echocardiography is a painless test that uses sound waves to create images of your heart. It provides your doctor with information about the size and shape of your heart and how well your heart's chambers and valves are working. This procedure takes approximately one hour. There are no restrictions for this procedure.  - Your physician has recommended that you have a sleep study. This test records several body functions during sleep, including: brain activity, eye movement, oxygen and carbon dioxide blood levels, heart rate and rhythm, breathing rate and rhythm, the flow of air through your mouth and nose, snoring, body muscle movements, and chest and belly movement.  Follow-Up: - Remote monitoring is used to monitor your Pacemaker of ICD from home. This monitoring reduces the number of office visits required to check your device to one time per year. It allows [place] to keep an eye on the functioning of your device to ensure it is working properly. You are scheduled for a device check from home on 07/11/15. You may send your transmission at any time that day. If you have a wireless device, the transmission will be sent automatically. After your physician reviews your transmission, you will receive a postcard with your next transmission date.  - Your physician wants you to follow-up in: 1 year with Dr. [name]. You will receive a reminder letter in the mail two months in advance. If you don't receive a letter, please call our office to schedule the follow-up appointment.  Any Additional Special Instructions Will Be Listed Below (If  Applicable).     If you need a refill on your cardiac medications before your next appointment, please call your pharmacy.

## 2015-04-12 ENCOUNTER — Other Ambulatory Visit: Payer: Self-pay | Admitting: *Deleted

## 2015-04-12 MED ORDER — AMLODIPINE BESYLATE 10 MG PO TABS: ORAL_TABLET | ORAL | Status: DC

## 2015-04-18 ENCOUNTER — Other Ambulatory Visit: Payer: Self-pay | Admitting: *Deleted

## 2015-04-18 DIAGNOSIS — G4733 Obstructive sleep apnea (adult) (pediatric): Secondary | ICD-10-CM

## 2015-04-19 ENCOUNTER — Telehealth: Payer: Self-pay | Admitting: Family Medicine

## 2015-04-19 ENCOUNTER — Ambulatory Visit: Payer: Self-pay | Admitting: Family Medicine

## 2015-04-19 NOTE — Telephone Encounter (Signed)
Pt is schedule to see Dr Sarajane Jews this afternoon at 2:00

## 2015-04-19 NOTE — Telephone Encounter (Signed)
Patient Name: Joanne Bell  DOB: 03-29-1960    Initial Comment Caller states hurt her back, left side tightened up, hurts trying to walk, moved to the side this morning   Nurse Assessment  Nurse: Raphael Gibney, RN, Vanita Ingles Date/Time (Eastern Time): 04/19/2015 9:06:13 AM  Confirm and document reason for call. If symptomatic, describe symptoms. You must click the next button to save text entered. ---Caller states she is having pain at the bottom of her back. Has pain when she bends her back or tries to straighten her back. the right side of her back hurts. Pain level 7-8 when she straightens up after bending over.  Has the patient traveled out of the country within the last 30 days? ---Not Applicable  Does the patient have any new or worsening symptoms? ---Yes  Will a triage be completed? ---Yes  Related visit to physician within the last 2 weeks? ---No  Does the PT have any chronic conditions? (i.e. diabetes, asthma, etc.) ---Yes  List chronic conditions. ---pacemaker  Is this a behavioral health or substance abuse call? ---No     Guidelines    Guideline Title Affirmed Question Affirmed Notes  Back Pain [1] Age > 74 AND [2] no history of prior similar back pain    Final Disposition User   See PCP When Office is Open (within 3 days) Raphael Gibney, RN, Vanita Ingles    Comments  appt scheduled for 2 pm 04/19/2015 at 2 pm with Dr. Sarajane Jews   Referrals  REFERRED TO PCP OFFICE   Disagree/Comply: Comply

## 2015-04-24 ENCOUNTER — Other Ambulatory Visit: Payer: Self-pay

## 2015-04-24 ENCOUNTER — Other Ambulatory Visit (INDEPENDENT_AMBULATORY_CARE_PROVIDER_SITE_OTHER): Payer: BLUE CROSS/BLUE SHIELD | Admitting: *Deleted

## 2015-04-24 ENCOUNTER — Ambulatory Visit (HOSPITAL_COMMUNITY): Payer: BLUE CROSS/BLUE SHIELD | Attending: Cardiovascular Disease

## 2015-04-24 DIAGNOSIS — I517 Cardiomegaly: Secondary | ICD-10-CM | POA: Insufficient documentation

## 2015-04-24 DIAGNOSIS — R0600 Dyspnea, unspecified: Secondary | ICD-10-CM | POA: Diagnosis not present

## 2015-04-24 DIAGNOSIS — Z950 Presence of cardiac pacemaker: Secondary | ICD-10-CM | POA: Insufficient documentation

## 2015-04-24 DIAGNOSIS — I10 Essential (primary) hypertension: Secondary | ICD-10-CM | POA: Insufficient documentation

## 2015-04-24 DIAGNOSIS — I442 Atrioventricular block, complete: Secondary | ICD-10-CM

## 2015-04-24 LAB — CBC WITH DIFFERENTIAL/PLATELET
BASOS ABS: 0 10*3/uL (ref 0.0–0.1)
Basophils Relative: 0 % (ref 0–1)
Eosinophils Absolute: 0.1 10*3/uL (ref 0.0–0.7)
Eosinophils Relative: 1 % (ref 0–5)
HEMATOCRIT: 35.4 % — AB (ref 36.0–46.0)
Hemoglobin: 11.7 g/dL — ABNORMAL LOW (ref 12.0–15.0)
LYMPHS ABS: 2.7 10*3/uL (ref 0.7–4.0)
LYMPHS PCT: 44 % (ref 12–46)
MCH: 26.8 pg (ref 26.0–34.0)
MCHC: 33.1 g/dL (ref 30.0–36.0)
MCV: 81 fL (ref 78.0–100.0)
MPV: 9 fL (ref 8.6–12.4)
Monocytes Absolute: 0.5 10*3/uL (ref 0.1–1.0)
Monocytes Relative: 8 % (ref 3–12)
NEUTROS PCT: 47 % (ref 43–77)
Neutro Abs: 2.9 10*3/uL (ref 1.7–7.7)
Platelets: 297 10*3/uL (ref 150–400)
RBC: 4.37 MIL/uL (ref 3.87–5.11)
RDW: 15.2 % (ref 11.5–15.5)
WBC: 6.2 10*3/uL (ref 4.0–10.5)

## 2015-04-24 LAB — BASIC METABOLIC PANEL
BUN: 14 mg/dL (ref 7–25)
CHLORIDE: 101 mmol/L (ref 98–110)
CO2: 28 mmol/L (ref 20–31)
CREATININE: 0.76 mg/dL (ref 0.50–1.05)
Calcium: 9.6 mg/dL (ref 8.6–10.4)
Glucose, Bld: 92 mg/dL (ref 65–99)
Potassium: 3.6 mmol/L (ref 3.5–5.3)
Sodium: 141 mmol/L (ref 135–146)

## 2015-04-30 ENCOUNTER — Telehealth: Payer: Self-pay | Admitting: Family Medicine

## 2015-04-30 NOTE — Telephone Encounter (Signed)
Spoke with patient and last week she had a sore throat.  This week she has head congestion with a cough.  Last night she vomitted and felt better. Today she is having some shortness of breath when walking.  She does not have any chest pain but some "tightness".  She says the cough is worse at night.  I advised her to call her cardiologist. Please advise

## 2015-04-30 NOTE — Telephone Encounter (Signed)
We need to see her in case this is an infection. We can see her in the morning.

## 2015-04-30 NOTE — Telephone Encounter (Signed)
Patient Name: Joanne Bell  DOB: [birth year]    Initial Comment Caller states on Friday she started to get a sore throat, progressed to stuffy nose. Last nighted vomiting and today is having shortness of breath. States she has a pacemaker.    Nurse Assessment  Nurse: Leilani Merl, RN, Heather Date/Time (Eastern Time): 04/30/2015 3:22:50 PM  Confirm and document reason for call. If symptomatic, describe symptoms. You must click the next button to save text entered. ---Caller states on Friday she started to get a sore throat, progressed to stuffy nose. Last night vomiting and today is having shortness of breath. States she has a pacemaker.  Has the patient traveled out of the country within the last 30 days? ---Not Applicable  Does the patient have any new or worsening symptoms? ---Yes  Will a triage be completed? ---Yes  Related visit to physician within the last 2 weeks? ---No  Does the PT have any chronic conditions? (i.e. diabetes, asthma, etc.) ---Yes  List chronic conditions. ---pacemaker,  Is this a behavioral health or substance abuse call? ---No     Guidelines    Guideline Title Affirmed Question Affirmed Notes  Cough - Acute Productive Difficulty breathing    Final Disposition User   Go to ED Now Stand[place], RN, Nira Conn    Comments  called the office, [phone] and information given.   Referrals  GO TO FACILITY REFUSED   Disagree/Comply: Disagree  Disagree/Comply Reason: Disagree with instructions

## 2015-04-30 NOTE — Telephone Encounter (Signed)
Dr. Sarajane Jews would like pt to be seen today if possible? Also is she complaining of any chest pain?

## 2015-04-30 NOTE — Telephone Encounter (Signed)
Spoke with patient and an appointment made with Dr Sarajane Jews 20/22/17 at 10:30 am.  Patient has also scheduled an appointment with cardiology.  Patient verbally agreed to go to the ER if her symptoms worsen tonight.

## 2015-05-01 ENCOUNTER — Encounter: Payer: Self-pay | Admitting: Family Medicine

## 2015-05-01 ENCOUNTER — Ambulatory Visit (INDEPENDENT_AMBULATORY_CARE_PROVIDER_SITE_OTHER): Payer: BLUE CROSS/BLUE SHIELD | Admitting: Family Medicine

## 2015-05-01 VITALS — BP 124/88 | HR 95 | Temp 98.7°F | Ht 64.0 in | Wt 150.0 lb

## 2015-05-01 DIAGNOSIS — J069 Acute upper respiratory infection, unspecified: Secondary | ICD-10-CM | POA: Diagnosis not present

## 2015-05-01 NOTE — Progress Notes (Signed)
   Subjective:    Patient ID: Joanne Bell, female    DOB: 02/21/1961, 55 y.o.   MRN: [id number]  HPI Here for 3 days of stuffy head, PND, coughing up clear sputum, and one episode of vomiting. No fever. She feels much better today and she actually went back to work.    Review of Systems  Constitutional: Negative.   HENT: Positive for congestion and postnasal drip. Negative for sinus pressure and sore throat.   Eyes: Negative.   Respiratory: Positive for cough.   Gastrointestinal: Positive for nausea and vomiting. Negative for abdominal pain, diarrhea, constipation, blood in stool and abdominal distention.       Objective:   Physical Exam  Constitutional: She appears well-developed and well-nourished.  HENT:  Right Ear: External ear normal.  Left Ear: External ear normal.  Nose: Nose normal.  Mouth/Throat: Oropharynx is clear and moist.  Eyes: Conjunctivae are normal.  Neck: No thyromegaly present.  Pulmonary/Chest: Effort normal and breath sounds normal. No respiratory distress. She has no wheezes. She has no rales.  Lymphadenopathy:    She has no cervical adenopathy.          Assessment & Plan:  Viral URI. This appears to be resolving. She will drink fluids and use Robitussin prn

## 2015-05-01 NOTE — Progress Notes (Signed)
Pre visit review using our clinic review tool, if applicable. No additional management support is needed unless otherwise documented below in the visit note. 

## 2015-05-06 ENCOUNTER — Ambulatory Visit: Payer: BLUE CROSS/BLUE SHIELD | Admitting: Physician Assistant

## 2015-05-14 ENCOUNTER — Ambulatory Visit (INDEPENDENT_AMBULATORY_CARE_PROVIDER_SITE_OTHER): Payer: BLUE CROSS/BLUE SHIELD | Admitting: Internal Medicine

## 2015-05-14 ENCOUNTER — Encounter: Payer: Self-pay | Admitting: Internal Medicine

## 2015-05-14 VITALS — BP 124/70 | HR 69 | Ht 64.0 in | Wt 152.6 lb

## 2015-05-14 DIAGNOSIS — I442 Atrioventricular block, complete: Secondary | ICD-10-CM

## 2015-05-14 DIAGNOSIS — Z950 Presence of cardiac pacemaker: Secondary | ICD-10-CM

## 2015-05-14 LAB — CUP PACEART INCLINIC DEVICE CHECK
Implantable Lead Implant Date: 20121210
Implantable Lead Location: 753860
Implantable Lead Model: 5086
MDC IDC LEAD IMPLANT DT: 20121210
MDC IDC LEAD LOCATION: 753859
MDC IDC LEAD MODEL: 5086
MDC IDC SESS DTM: 20170307170229

## 2015-05-14 NOTE — Patient Instructions (Signed)
Medication Instructions:  Your physician recommends that you continue on your current medications as directed. Please refer to the Current Medication list given to you today.   Labwork: None ordered  Testing/Procedures: None ordered  Follow-Up: Your physician wants you to follow-up in: December with Dr Gari Crown will receive a reminder letter in the mail two months in advance. If you don't receive a letter, please call our office to schedule the follow-up appointment.  Remote monitoring is used to monitor your Pacemaker from home. This monitoring reduces the number of office visits required to check your device to one time per year. It allows [place] to keep an eye on the functioning of your device to ensure it is working properly. You are scheduled for a device check from home on 08/13/15 You may send your transmission at any time that day. If you have a wireless device, the transmission will be sent automatically. After your physician reviews your transmission, you will receive a postcard with your next transmission date.     Any Other Special Instructions Will Be Listed Below (If Applicable).  Will call you later this week after [name] has looked at your echo more.  If EF is truly low will schedule you for a CRT-P upgrade     If you need a refill on your cardiac medications before your next appointment, please call your pharmacy.

## 2015-05-14 NOTE — Progress Notes (Signed)
Electrophysiology Office Note   Date:  05/14/2015   ID:  Joanne Bell, DOB 1960-04-21, MRN [id number]  PCP:  Joanne Morale, MD  Cardiologist:    Primary Electrophysiologist: Joanne Bell   Chief Complaint  Patient presents with  . Pacemaker Check     History of Present Illness: Joanne Bell is a 55 y.o. female who presents today for electrophysiology  followup for a pacemaker implanted December 2012 for complete heart block  Her workup for underlying causes was negative including MRI echo Lyme titers and chest x-ray these were all normal.    She was seen 2/17. Notwithstanding the absecnnce of symptoms we undertook echocardiogram demonstrated ejection fraction 30-35% representating significant interval deterioration from 1/15 with EF of 45-50%.   She has significant snoring sleep disordered breathing and daytime somnolence. This is been getting worse. She denies significant dyspnea on exertion   Past Medical History  Diagnosis Date  . Atrioventricular block, complete (HCC)     narrow QRS  . Hypertension   . Fibroids, intramural   . Bilateral ovarian cysts   . Colon polyps   . Pacemaker     Medtronic REVO   Past Surgical History  Procedure Laterality Date  . Myomectomy  1998    per Dr. Garwin Bell  . Endometrial ablation  2004  . Colonoscopy  2010    benign polyps, repeat in 5 yrs  . Permanent pacemaker insertion Left 02/16/2011    Procedure: PERMANENT PACEMAKER INSERTION;  Surgeon: Joanne Sprang, MD;  Location: [facility] CATH LAB;  Service: Cardiovascular;  Laterality: Left;     Current Outpatient Prescriptions  Medication Sig Dispense Refill  . amLODipine (NORVASC) 10 MG tablet TAKE 1 TABLET (10 MG TOTAL) BY MOUTH DAILY. 90 tablet 1  . losartan (COZAAR) 100 MG tablet TAKE 1 TABLET (100 MG TOTAL) BY MOUTH DAILY. 30 tablet 10  . omeprazole (PRILOSEC) 40 MG capsule Take 1 capsule by mouth daily.  2  . vitamin E 100 UNIT capsule Take 100 Units by mouth daily.     No  current facility-administered medications for this visit.    Allergies:   Review of patient's allergies indicates no known allergies.   Social History:  The patient  reports that she has never smoked. She has never used smokeless tobacco. She reports that she does not drink alcohol or use illicit drugs.   Family History:  [name] family history includes Arthritis in her father; Depression in her mother; Diabetes in her father; Hyperlipidemia in her father; Hypertension in her father; Stroke in her father.    ROS:  Please see the history of present illness.   Otherwise, review of systems is positive for noen.   All other systems are reviewed and negative.    PHYSICAL EXAM: VS:  BP 124/70 mmHg  Pulse 69  Ht 5\' 4"  (1.626 m)  Wt 152 lb 9.6 oz (69.219 kg)  BMI 26.18 kg/m2 , BMI Body mass index is 26.18 kg/(m^2). GEN: Well nourished, well developed, in no acute distress HEENT: normal Neck: supple  MS: no deformity or atrophy Skin: warm and dry,    Neuro:  Strength and sensation are intact Psych: euthymic mood, full affect  EKG:  EKG is ordered today. The ekg ordered today shows P-synchronous/ AV  pacing     Device interrogation is reviewed today in detail.  See PaceArt for details.   Recent Labs: 04/24/2015: BUN 14; Creat 0.76; Hemoglobin 11.7*; Platelets 297; Potassium 3.6; Sodium 141  Lipid Panel     Component Value Date/Time   CHOL 168 08/06/2010 1023   TRIG 160.0* 08/06/2010 1023   HDL 47.20 08/06/2010 1023   CHOLHDL 4 08/06/2010 1023   VLDL 32.0 08/06/2010 1023   LDLCALC 89 08/06/2010 1023     Wt Readings from Last 3 Encounters:  05/14/15 152 lb 9.6 oz (69.219 kg)  05/01/15 150 lb (68.04 kg)  04/11/15 154 lb (69.854 kg)     Other studies Reviewed:     ASSESSMENT AND PLAN:  Complete heart block  Hypertension  sleep disordered breathing and daytime somnolence  Cardiomyopathy  Pacemaker-Medtronic  [name] device was interrogated.  The  information was reviewed. No changes were made in the programming.     Blood pressure is well-controlled  We have discussed the potential implications related to cardiomyopathy in the context of percent ventricular pacing. 1 hypothesis is that his pacemaker derived. The other hypothesis would be that whatever was the cause of the heart block as a cardiomyopathic process might also be vomiting. I will review the echo with colleagues directly. If it is clearly 30-to 40% range, CRT P upgrade would be indicated. If it is not clear and we will undertake MUGA scan  We spent more than 50% of our >25 min visit in face to face counseling regarding the above   sleep study pending 4/17   Current medicines are reviewed at length with the patient today.   The patient is does not have concerns regarding her medicines.  The following changes were made today:      Disposition:   FU with    12 month     We spent more than 50% of our >25 min visit in face to face counseling regarding the above   Signed, Joanne Axe, MD  05/14/2015 3:46 PM      [address] [place] [address] [phone] (office) [phone] (fax)

## 2015-05-29 ENCOUNTER — Telehealth: Payer: Self-pay | Admitting: Internal Medicine

## 2015-05-29 NOTE — Telephone Encounter (Signed)
Pt was told that her Echo was abnormal about 3 weeks ago, said she was to hear back from Dr. [name] about having a procedure-hasn't heard anything yet--pls advise [phone]

## 2015-05-29 NOTE — Telephone Encounter (Signed)
Advised that both Dr. [name] and his nurse Johnell Comings is out of the office today so do not know the status of the procedure that was to be scheduled. Advised will forward message to them for recommendations.

## 2015-05-29 NOTE — Telephone Encounter (Signed)
LMTCB

## 2015-05-30 NOTE — Telephone Encounter (Signed)
Attempted to contact the patient I left a message that I will call her back at the beginning of next week when I am back in the office.

## 2015-05-30 NOTE — Telephone Encounter (Signed)
Per Dr. [name]- he reviewed with Dr. Stanford Breed. [name] EF was 30%- ok to proceed with CRT if that is what the patient desires.

## 2015-05-30 NOTE — Telephone Encounter (Signed)
Will review with Dr. Klein. 

## 2015-05-31 NOTE — Telephone Encounter (Signed)
Returned call to patient.  She would like to meet with [name] to discuss as she has some questions she would answered prior to scheduling.  She says he was going to discuss her case with his colleagues and she wants to know what the outcome was of discussion.  I let her know I would forward to [facility] to schedule with [name].  She was appreciative of my call.

## 2015-05-31 NOTE — Telephone Encounter (Signed)
Pt is calling you back 

## 2015-05-31 NOTE — Telephone Encounter (Signed)
lmom for patient to call me back to schedule her procedure.

## 2015-06-01 ENCOUNTER — Other Ambulatory Visit: Payer: Self-pay | Admitting: Internal Medicine

## 2015-06-03 NOTE — Telephone Encounter (Signed)
Pt returning call-can be reached at [phone]

## 2015-06-04 NOTE — Telephone Encounter (Signed)
Follow Up   Pt call again. Request a call back to discuss

## 2015-06-04 NOTE — Telephone Encounter (Signed)
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 [name] will see her at 8:15 am on 06/21/15 and to please call back if this does not work for her.

## 2015-06-20 NOTE — Progress Notes (Signed)
Electrophysiology Office Note   Date:  06/21/2015   ID:  CHIOMA TAY, DOB 06-27-1960, MRN [id number]  PCP:  [name], MD  Cardiologist:    Primary Electrophysiologist: sk   Chief Complaint  Patient presents with  . Shortness of Breath    a little, dizzy sometimes when laying down     History of Present Illness: Joanne Bell is a 55 y.o. female who presents today for electrophysiology  followup for a pacemaker implanted December 2012 for complete heart block  Her workup for underlying causes was negative including  MRI  echo Lyme titers and chest x-ray these were all normal. EF was normal by MRI 12/12   She has underlying hypertension  She was seen 2/17. Notwithstanding the absence of symptoms, we undertook echocardiogram demonstrated ejection fraction 30-35% representating significant interval deterioration from 1/15 with EF of 45-50%.  I had Dr. Reinaldo Berber review the echo and he felt strongly that the ejection fraction measurement was correct.   She has significant snoring sleep disordered breathing and daytime somnolence. This is been getting worse.\  She has recently noted some dyspnea on exertion  Past Medical History  Diagnosis Date  . Atrioventricular block, complete (HCC)     narrow QRS  . Hypertension   . Fibroids, intramural   . Bilateral ovarian cysts   . Colon polyps   . Pacemaker     Medtronic REVO   Past Surgical History  Procedure Laterality Date  . Myomectomy  1998    per Dr. Garwin Brothers  . Endometrial ablation  2004  . Colonoscopy  2010    benign polyps, repeat in 5 yrs  . Permanent pacemaker insertion Left 02/16/2011    Procedure: PERMANENT PACEMAKER INSERTION;  Surgeon: Deboraha Sprang, MD;  Location: [facility] CATH LAB;  Service: Cardiovascular;  Laterality: Left;     Current Outpatient Prescriptions  Medication Sig Dispense Refill  . amLODipine (NORVASC) 10 MG tablet TAKE 1 TABLET (10 MG TOTAL) BY MOUTH DAILY. 90 tablet 1  . losartan  (COZAAR) 100 MG tablet TAKE 1 TABLET (100 MG TOTAL) BY MOUTH DAILY. 30 tablet 11  . omeprazole (PRILOSEC) 40 MG capsule Take 1 capsule by mouth daily.  2  . vitamin E 100 UNIT capsule Take 100 Units by mouth daily.     No current facility-administered medications for this visit.    Allergies:   Review of patient's allergies indicates no known allergies.   Social History:  The patient  reports that she has never smoked. She has never used smokeless tobacco. She reports that she does not drink alcohol or use illicit drugs.   Family History:  [name] family history includes Arthritis in her father; Depression in her mother; Diabetes in her father; Hyperlipidemia in her father; Hypertension in her father; Stroke in her father.    ROS:  Please see the history of present illness.   Otherwise, review of systems is positive for noen.   All other systems are reviewed and negative.    PHYSICAL EXAM: VS:  BP 162/98 mmHg  Pulse 64  Ht 5\' 4"  (1.626 m)  Wt 152 lb 6.4 oz (69.128 kg)  BMI 26.15 kg/m2 , BMI Body mass index is 26.15 kg/(m^2). GEN: Well nourished, well developed, in no acute distress HEENT: normal Neck: supple  MS: no deformity or atrophy Skin: warm and dry,    Neuro:  Strength and sensation are intact Psych: euthymic mood, full affect  EKG:  EKG is ordered today.  The ekg ordered today shows P-synchronous/ AV  pacing     Device interrogation is reviewed today in detail.  See PaceArt for details.   Recent Labs: 04/24/2015: BUN 14; Creat 0.76; Hemoglobin 11.7*; Platelets 297; Potassium 3.6; Sodium 141    Lipid Panel     Component Value Date/Time   CHOL 168 08/06/2010 1023   TRIG 160.0* 08/06/2010 1023   HDL 47.20 08/06/2010 1023   CHOLHDL 4 08/06/2010 1023   VLDL 32.0 08/06/2010 1023   LDLCALC 89 08/06/2010 1023     Wt Readings from Last 3 Encounters:  06/21/15 152 lb 6.4 oz (69.128 kg)  05/14/15 152 lb 9.6 oz (69.219 kg)  05/01/15 150 lb (68.04 kg)      Other studies Reviewed:     ASSESSMENT AND PLAN:  Complete heart block  Hypertension  sleep disordered breathing and daytime somnolence  Cardiomyopathy  Pacemaker-Medtronic  [name] device was interrogated.  The information was reviewed. No changes were made in the programming.     Blood pressure is elevated  We have discussed the potential implications related to cardiomyopathy in the context of percent ventricular pacing. 1 hypothesis is that his pacemaker derived. The other hypothesis would be that whatever was the cause of the heart block as a cardiomyopathic process might also be vomiting. I will review the echo with colleagues directly. If it is clearly 30-to 40% range, CRT P or D upgrade would be indicated we have discussed the fact that there was an underlying cardiomyopathy prior to pacemaker implantation. This raises the concern that the LV dysfunction that has been progressive is not simply related to the pacing but potentially also a cardiomyopathic process worsening.  Hence, we discussed CRT-D versus CRT P and we will proceed with the former.  In addition, we will work on discontinuing her amlodipine and having bilateral therapy for cardiomyopathy ie  carvedilol 6.25 mg twice daily.   We spent more than 50% of our >25 min visit in face to face counseling regarding the above   sleep study pending 4/17   Current medicines are reviewed at length with the patient today.   The patient is does not have concerns regarding her medicines.  The following changes were made today:      Disposition:   Will proceed with CRT-D upgrade Have reviewed the potential benefits and risks of ICD implantation including but not limited to death, perforation of heart or lung, lead dislodgement, infection,  device malfunction and inappropriate shocks.  The patient and family express understanding  and are willing to proceed.       We spent more than 50% of our >25 min visit in face to  face counseling regarding the above   Signed, [name], MD  06/21/2015 8:52 AM      [place] [address] [phone] (office) [phone] (fax)

## 2015-06-21 ENCOUNTER — Encounter: Payer: Self-pay | Admitting: Internal Medicine

## 2015-06-21 ENCOUNTER — Encounter: Payer: Self-pay | Admitting: *Deleted

## 2015-06-21 ENCOUNTER — Ambulatory Visit (INDEPENDENT_AMBULATORY_CARE_PROVIDER_SITE_OTHER): Payer: BLUE CROSS/BLUE SHIELD | Admitting: Internal Medicine

## 2015-06-21 VITALS — BP 162/98 | HR 64 | Ht 64.0 in | Wt 152.4 lb

## 2015-06-21 DIAGNOSIS — Z01812 Encounter for preprocedural laboratory examination: Secondary | ICD-10-CM

## 2015-06-21 DIAGNOSIS — I429 Cardiomyopathy, unspecified: Secondary | ICD-10-CM

## 2015-06-21 DIAGNOSIS — I442 Atrioventricular block, complete: Secondary | ICD-10-CM | POA: Diagnosis not present

## 2015-06-21 DIAGNOSIS — Z950 Presence of cardiac pacemaker: Secondary | ICD-10-CM

## 2015-06-21 MED ORDER — CARVEDILOL 6.25 MG PO TABS: 6.2500 mg | ORAL_TABLET | Freq: Two times a day (BID) | ORAL | Status: DC

## 2015-06-21 NOTE — Patient Instructions (Addendum)
Medication Instructions: 1) Start coreg (carvedilol) 6.25 mg one tablet by mouth twice daily 2) Decrease norvasc (amlodipine) to 10 mg 1/2 tablet (5 mg) once daily  Labwork: - Your physician recommends that you return for lab work: Friday 07/12/15- CBC/ BMP/ INR  Procedures/Testing: - Your physician has recommended that you have a CRT (Bi-V) defibrillator upgrade. CRT or cardiac resynchronization therapy is a treatment used to correct heart failure. When you have heart failure your heart is weakened and doesn't pump as well as it should. This therapy may help reduce symptoms and improve the quality of life.  Nira Conn will mail you instructions for the procedure- will schedule for Friday 07/19/15   Follow-Up: - Your physician recommends that you schedule a follow-up appointment in: 10-14 days (from 07/19/15) for a wound check with the device clinic.   Any Additional Special Instructions Will Be Listed Below (If Applicable).     If you need a refill on your cardiac medications before your next appointment, please call your pharmacy.

## 2015-06-23 ENCOUNTER — Ambulatory Visit (HOSPITAL_BASED_OUTPATIENT_CLINIC_OR_DEPARTMENT_OTHER): Payer: BLUE CROSS/BLUE SHIELD

## 2015-07-12 ENCOUNTER — Other Ambulatory Visit (INDEPENDENT_AMBULATORY_CARE_PROVIDER_SITE_OTHER): Payer: BLUE CROSS/BLUE SHIELD | Admitting: *Deleted

## 2015-07-12 DIAGNOSIS — I429 Cardiomyopathy, unspecified: Secondary | ICD-10-CM

## 2015-07-12 DIAGNOSIS — Z01812 Encounter for preprocedural laboratory examination: Secondary | ICD-10-CM

## 2015-07-12 DIAGNOSIS — I442 Atrioventricular block, complete: Secondary | ICD-10-CM | POA: Diagnosis not present

## 2015-07-12 LAB — BASIC METABOLIC PANEL
BUN: 17 mg/dL (ref 7–25)
CHLORIDE: 102 mmol/L (ref 98–110)
CO2: 30 mmol/L (ref 20–31)
CREATININE: 0.94 mg/dL (ref 0.50–1.05)
Calcium: 9.9 mg/dL (ref 8.6–10.4)
GLUCOSE: 99 mg/dL (ref 65–99)
Potassium: 3.7 mmol/L (ref 3.5–5.3)
Sodium: 142 mmol/L (ref 135–146)

## 2015-07-12 LAB — CBC WITH DIFFERENTIAL/PLATELET
BASOS PCT: 0 %
Basophils Absolute: 0 cells/uL (ref 0–200)
EOS ABS: 62 {cells}/uL (ref 15–500)
Eosinophils Relative: 1 %
HEMATOCRIT: 35.3 % (ref 35.0–45.0)
Hemoglobin: 11.5 g/dL — ABNORMAL LOW (ref 11.7–15.5)
LYMPHS PCT: 52 %
Lymphs Abs: 3224 cells/uL (ref 850–3900)
MCH: 26.1 pg — AB (ref 27.0–33.0)
MCHC: 32.6 g/dL (ref 32.0–36.0)
MCV: 80.2 fL (ref 80.0–100.0)
MONO ABS: 558 {cells}/uL (ref 200–950)
MONOS PCT: 9 %
MPV: 9.3 fL (ref 7.5–12.5)
NEUTROS PCT: 38 %
Neutro Abs: 2356 cells/uL (ref 1500–7800)
PLATELETS: 315 10*3/uL (ref 140–400)
RBC: 4.4 MIL/uL (ref 3.80–5.10)
RDW: 14.4 % (ref 11.0–15.0)
WBC: 6.2 10*3/uL (ref 3.8–10.8)

## 2015-07-12 LAB — PROTIME-INR
INR: 0.92 (ref <1.50)
Prothrombin Time: 12.5 seconds (ref 11.6–15.2)

## 2015-07-19 ENCOUNTER — Encounter (HOSPITAL_COMMUNITY)
Admission: RE | Disposition: A | Discharge status: Home / Self Care | Payer: Self-pay | Source: Ambulatory Visit | Attending: Internal Medicine

## 2015-07-19 ENCOUNTER — Ambulatory Visit (HOSPITAL_COMMUNITY)
Admission: RE | Admit: 2015-07-19 | Discharge: 2015-07-20 | Disposition: A | Discharge status: Home / Self Care | Payer: BLUE CROSS/BLUE SHIELD | Source: Ambulatory Visit | Attending: Internal Medicine | Admitting: Internal Medicine

## 2015-07-19 DIAGNOSIS — I442 Atrioventricular block, complete: Secondary | ICD-10-CM | POA: Insufficient documentation

## 2015-07-19 DIAGNOSIS — I10 Essential (primary) hypertension: Secondary | ICD-10-CM | POA: Diagnosis present

## 2015-07-19 DIAGNOSIS — I110 Hypertensive heart disease with heart failure: Secondary | ICD-10-CM | POA: Insufficient documentation

## 2015-07-19 DIAGNOSIS — I429 Cardiomyopathy, unspecified: Secondary | ICD-10-CM | POA: Insufficient documentation

## 2015-07-19 DIAGNOSIS — Z959 Presence of cardiac and vascular implant and graft, unspecified: Secondary | ICD-10-CM

## 2015-07-19 DIAGNOSIS — G4730 Sleep apnea, unspecified: Secondary | ICD-10-CM | POA: Diagnosis not present

## 2015-07-19 DIAGNOSIS — Z950 Presence of cardiac pacemaker: Secondary | ICD-10-CM

## 2015-07-19 DIAGNOSIS — I428 Other cardiomyopathies: Secondary | ICD-10-CM

## 2015-07-19 DIAGNOSIS — I5022 Chronic systolic (congestive) heart failure: Secondary | ICD-10-CM | POA: Diagnosis not present

## 2015-07-19 DIAGNOSIS — R400 Somnolence: Secondary | ICD-10-CM | POA: Insufficient documentation

## 2015-07-19 DIAGNOSIS — Z01812 Encounter for preprocedural laboratory examination: Secondary | ICD-10-CM

## 2015-07-19 DIAGNOSIS — T82110A Breakdown (mechanical) of cardiac electrode, initial encounter: Secondary | ICD-10-CM | POA: Diagnosis not present

## 2015-07-19 DIAGNOSIS — Z95810 Presence of automatic (implantable) cardiac defibrillator: Secondary | ICD-10-CM | POA: Diagnosis not present

## 2015-07-19 HISTORY — PX: EP IMPLANTABLE DEVICE: SHX172B

## 2015-07-19 HISTORY — DX: Other cardiomyopathies: I42.8

## 2015-07-19 HISTORY — DX: Chronic systolic (congestive) heart failure: I50.22

## 2015-07-19 HISTORY — DX: Presence of automatic (implantable) cardiac defibrillator: Z95.810

## 2015-07-19 LAB — SURGICAL PCR SCREEN
MRSA, PCR: NEGATIVE
Staphylococcus aureus: NEGATIVE

## 2015-07-19 SURGERY — BIV UPGRADE
Anesthesia: LOCAL

## 2015-07-19 MED ORDER — ONDANSETRON HCL 4 MG/2ML IJ SOLN: 4.0000 mg | Freq: Four times a day (QID) | INTRAMUSCULAR | Status: DC | PRN

## 2015-07-19 MED ORDER — FENTANYL CITRATE (PF) 100 MCG/2ML IJ SOLN
INTRAMUSCULAR | Status: DC | PRN
  Administered 2015-07-19: 25 ug via INTRAVENOUS
  Administered 2015-07-19: 50 ug via INTRAVENOUS
  Administered 2015-07-19: 25 ug via INTRAVENOUS

## 2015-07-19 MED ORDER — CEFAZOLIN SODIUM 1-5 GM-% IV SOLN
1.0000 g | Freq: Four times a day (QID) | INTRAVENOUS | Status: AC
  Administered 2015-07-19 – 2015-07-20 (×3): 1 g via INTRAVENOUS
  Filled 2015-07-19 (×2): qty 50

## 2015-07-19 MED ORDER — SODIUM CHLORIDE 0.9 % IV SOLN: INTRAVENOUS | Status: DC

## 2015-07-19 MED ORDER — ACETAMINOPHEN 325 MG PO TABS
ORAL_TABLET | ORAL | Status: AC
  Filled 2015-07-19: qty 1

## 2015-07-19 MED ORDER — MIDAZOLAM HCL 5 MG/5ML IJ SOLN
INTRAMUSCULAR | Status: DC | PRN
  Administered 2015-07-19 (×2): 1 mg via INTRAVENOUS
  Administered 2015-07-19: 2 mg via INTRAVENOUS
  Administered 2015-07-19: 1 mg via INTRAVENOUS

## 2015-07-19 MED ORDER — HEPARIN (PORCINE) IN NACL 2-0.9 UNIT/ML-% IJ SOLN
INTRAMUSCULAR | Status: AC
  Filled 2015-07-19: qty 500

## 2015-07-19 MED ORDER — LIDOCAINE HCL (PF) 1 % IJ SOLN
INTRAMUSCULAR | Status: AC
  Filled 2015-07-19: qty 30

## 2015-07-19 MED ORDER — ONDANSETRON HCL 4 MG/2ML IJ SOLN
INTRAMUSCULAR | Status: AC
  Filled 2015-07-19: qty 2

## 2015-07-19 MED ORDER — MUPIROCIN 2 % EX OINT
TOPICAL_OINTMENT | CUTANEOUS | Status: AC
  Administered 2015-07-19: 1 via TOPICAL
  Filled 2015-07-19: qty 22

## 2015-07-19 MED ORDER — MIDAZOLAM HCL 5 MG/5ML IJ SOLN
INTRAMUSCULAR | Status: AC
  Filled 2015-07-19: qty 5

## 2015-07-19 MED ORDER — MUPIROCIN 2 % EX OINT
1.0000 "application " | TOPICAL_OINTMENT | Freq: Once | CUTANEOUS | Status: AC
  Administered 2015-07-19: 1 via TOPICAL
  Filled 2015-07-19: qty 22

## 2015-07-19 MED ORDER — VITAMIN E 45 MG (100 UNIT) PO CAPS
100.0000 [IU] | ORAL_CAPSULE | Freq: Every day | ORAL | Status: DC | PRN
  Filled 2015-07-19: qty 1

## 2015-07-19 MED ORDER — HEPARIN (PORCINE) IN NACL 2-0.9 UNIT/ML-% IJ SOLN
INTRAMUSCULAR | Status: DC | PRN
  Administered 2015-07-19: 09:00:00

## 2015-07-19 MED ORDER — SODIUM CHLORIDE 0.9 % IR SOLN
Status: AC
  Filled 2015-07-19: qty 2

## 2015-07-19 MED ORDER — ACETAMINOPHEN 325 MG PO TABS
325.0000 mg | ORAL_TABLET | ORAL | Status: DC | PRN
Start: 2015-07-19 — End: 2015-07-20
  Administered 2015-07-19 – 2015-07-20 (×5): 650 mg via ORAL
  Filled 2015-07-19 (×5): qty 2

## 2015-07-19 MED ORDER — CEFAZOLIN SODIUM-DEXTROSE 2-4 GM/100ML-% IV SOLN
2.0000 g | INTRAVENOUS | Status: AC
  Administered 2015-07-19: 2 g via INTRAVENOUS

## 2015-07-19 MED ORDER — FENTANYL CITRATE (PF) 100 MCG/2ML IJ SOLN
INTRAMUSCULAR | Status: AC
  Filled 2015-07-19: qty 2

## 2015-07-19 MED ORDER — LIDOCAINE HCL (PF) 1 % IJ SOLN
INTRAMUSCULAR | Status: DC | PRN
  Administered 2015-07-19: 53 mL

## 2015-07-19 MED ORDER — SODIUM CHLORIDE 0.9 % IR SOLN
80.0000 mg | Status: AC
  Administered 2015-07-19: 80 mg

## 2015-07-19 MED ORDER — IOPAMIDOL (ISOVUE-370) INJECTION 76%
INTRAVENOUS | Status: AC
  Filled 2015-07-19: qty 50

## 2015-07-19 MED ORDER — CARVEDILOL 6.25 MG PO TABS
6.2500 mg | ORAL_TABLET | Freq: Two times a day (BID) | ORAL | Status: DC
  Administered 2015-07-20: 6.25 mg via ORAL
  Filled 2015-07-19 (×3): qty 1

## 2015-07-19 MED ORDER — CHLORHEXIDINE GLUCONATE 4 % EX LIQD
60.0000 mL | Freq: Once | CUTANEOUS | Status: DC
  Filled 2015-07-19: qty 60

## 2015-07-19 MED ORDER — PANTOPRAZOLE SODIUM 40 MG PO TBEC
40.0000 mg | DELAYED_RELEASE_TABLET | Freq: Every day | ORAL | Status: DC
  Administered 2015-07-20: 40 mg via ORAL
  Filled 2015-07-19: qty 1

## 2015-07-19 MED ORDER — LOSARTAN POTASSIUM 50 MG PO TABS
50.0000 mg | ORAL_TABLET | Freq: Every day | ORAL | Status: DC
  Administered 2015-07-20: 50 mg via ORAL
  Filled 2015-07-19: qty 1

## 2015-07-19 MED ORDER — IOPAMIDOL (ISOVUE-370) INJECTION 76%
INTRAVENOUS | Status: DC | PRN
  Administered 2015-07-19: 20 mL

## 2015-07-19 MED ORDER — ACETAMINOPHEN 325 MG PO TABS
ORAL_TABLET | ORAL | Status: AC
  Filled 2015-07-19: qty 2

## 2015-07-19 MED ORDER — CEFAZOLIN SODIUM-DEXTROSE 2-4 GM/100ML-% IV SOLN
INTRAVENOUS | Status: AC
  Filled 2015-07-19: qty 100

## 2015-07-19 MED ORDER — OXYCODONE HCL 5 MG PO TABS: 5.0000 mg | ORAL_TABLET | ORAL | Status: DC | PRN

## 2015-07-19 MED ORDER — SODIUM CHLORIDE 0.9 % IV SOLN
INTRAVENOUS | Status: DC
  Administered 2015-07-19: 07:00:00 via INTRAVENOUS

## 2015-07-19 MED ORDER — ONDANSETRON HCL 4 MG/2ML IJ SOLN
4.0000 mg | Freq: Once | INTRAMUSCULAR | Status: AC
  Administered 2015-07-19: 4 mg via INTRAVENOUS

## 2015-07-19 MED ORDER — CEFAZOLIN SODIUM 1-5 GM-% IV SOLN
INTRAVENOUS | Status: AC
  Filled 2015-07-19: qty 50

## 2015-07-19 MED ORDER — SODIUM CHLORIDE 0.9 % IV SOLN
INTRAVENOUS | Status: DC
  Administered 2015-07-19: 06:00:00 via INTRAVENOUS

## 2015-07-19 SURGICAL SUPPLY — 19 items
ADAPTER SEALING SSA-EW-09 (MISCELLANEOUS) ×1 IMPLANT
ADPR INTRO LNG 9FR SL XTD WNG (MISCELLANEOUS) ×1
CABLE SURGICAL S-101-97-12 (CABLE) ×1 IMPLANT
CATH ATTAIN LDS 6216A-MB2 (CATHETERS) ×1 IMPLANT
HEMOSTAT SURGICEL 2X4 FIBR (HEMOSTASIS) ×2 IMPLANT
ICD VIVA QUAD XT CRT-D DTBA1QQ (ICD Generator) ×2 IMPLANT
KIT ESSENTIALS PG (KITS) ×2 IMPLANT
LEAD ATTAIN PERFORMA S 4598-88 (Lead) ×2 IMPLANT
LEAD SPRINT QUAT SEC 6935M-62 (Lead) ×1 IMPLANT
PAD DEFIB LIFELINK (PAD) ×2 IMPLANT
SHEATH CLASSIC 9.5F (SHEATH) ×2 IMPLANT
SHEATH CLASSIC 9F (SHEATH) ×1 IMPLANT
SHEATH CLASSIC 9F 25CM (SHEATH) ×2 IMPLANT
SHIELD RADPAD SCOOP 12X17 (MISCELLANEOUS) ×2 IMPLANT
SLITTER 6232ADJ (MISCELLANEOUS) ×2 IMPLANT
TOOL TRANSVALV INSERT TVI-07 (MISCELLANEOUS) ×1 IMPLANT
TRAY PACEMAKER INSERTION (PACKS) ×2 IMPLANT
WIRE ACUITY WHISPER EDS 4648 (WIRE) ×2 IMPLANT
WIRE HI TORQ VERSACORE-J 145CM (WIRE) ×2 IMPLANT

## 2015-07-19 NOTE — Treatment Plan (Signed)
Pain c/o persistent "8/10" pain at ICD insertion site that is refractory to acetaminophen.  Ordered oxycodone 5mg  PO Q4H PRN severe pain overnight

## 2015-07-19 NOTE — Interval H&P Note (Signed)
History and Physical Interval Note:  07/19/2015 7:33 AM  Joanne Bell  has presented today for surgery, with the diagnosis of heart block, cardiomyopathy  The various methods of treatment have been discussed with the patient and family. After consideration of risks, benefits and other options for treatment, the patient has consented to  Procedure(s): Upgrade to BIV ICD  (N/A) as a surgical intervention .  [name] history has been reviewed, patient examined, no change in status, stable for surgery.  I have reviewed the patient's chart and labs.  Questions were answered to the patient's satisfaction.     [name]

## 2015-07-19 NOTE — Interval H&P Note (Signed)
History and Physical Interval Note:  07/19/2015 10:15 AM  Joanne Bell  has presented today for surgery, with the diagnosis of heart block, cardiomyopathy  The various methods of treatment have been discussed with the patient and family. After consideration of risks, benefits and other options for treatment, the patient has consented to  Procedure(s): Upgrade to BIV ICD  (N/A) as a surgical intervention .  [name] history has been reviewed, patient examined, no change in status, stable for surgery.  I have reviewed the patient's chart and labs.  Questions were answered to the patient's satisfaction.     [name]

## 2015-07-19 NOTE — Interval H&P Note (Signed)
ICD Criteria   Beta Blocker therapy for 3 or more months: Yes, prescribed.   Ace Inhibitor/ARB therapy for 3 or more months: Yes, prescribed.   History and Physical Interval Note:  07/19/2015 10:15 AM  Joanne Bell  has presented today for surgery, with the diagnosis of heart block, cardiomyopathy  The various methods of treatment have been discussed with the patient and family. After consideration of risks, benefits and other options for treatment, the patient has consented to  Procedure(s): Upgrade to BIV ICD  (N/A) as a surgical intervention .  [name] history has been reviewed, patient examined, no change in status, stable for surgery.  I have reviewed the patient's chart and labs.  Questions were answered to the patient's satisfaction.     [name]

## 2015-07-19 NOTE — Progress Notes (Signed)
Report given to Cyndia Diver RN to assume patient care.

## 2015-07-19 NOTE — Progress Notes (Signed)
Left shoulder pain level has moved from a level 10 to a level 4 after Tylenol given.

## 2015-07-19 NOTE — Interval H&P Note (Signed)
ICD Criteria  Current LVEF:30%. Within 12 months prior to implant: Yes   Heart failure history: Yes, Class III  Cardiomyopathy history: Yes, Non-Ischemic Cardiomyopathy.  Atrial Fibrillation/Atrial Flutter: No.  Ventricular tachycardia history: No.  Cardiac arrest history: No.  History of syndromes with risk of sudden death: No.  Previous ICD: No.  Current ICD indication: Primary  PPM indication: Yes. Pacing type: Ventricular. Greater than 40% RV pacing requirement anticipated. Indication: Complete Heart Block   Class I or II Bradycardia indication present: Yes  Beta Blocker therapy for 3 or more months: No, medical reason.  Ace Inhibitor/ARB therapy for 3 or more months: Yes, prescribed.   History and Physical Interval Note:  07/19/2015 7:34 AM  Joanne Bell  has presented today for surgery, with the diagnosis of heart block, cardiomyopathy  The various methods of treatment have been discussed with the patient and family. After consideration of risks, benefits and other options for treatment, the patient has consented to  Procedure(s): Upgrade to BIV ICD  (N/A) as a surgical intervention .  [name] history has been reviewed, patient examined, no change in status, stable for surgery.  I have reviewed the patient's chart and labs.  Questions were answered to the patient's satisfaction.     [name]

## 2015-07-19 NOTE — Discharge Instructions (Signed)
° ° °  Supplemental Discharge Instructions for  Pacemaker/Defibrillator Patients  Activity No heavy lifting or vigorous activity with your left/right arm for 6 to 8 weeks.  Do not raise your left/right arm above your head for one week.  Gradually raise your affected arm as drawn below.              07/23/15                    07/24/15                    07/25/15                  07/26/15 __  NO DRIVING for  1 week   ; you may begin driving on  [id number]    .  WOUND CARE - Keep the wound area clean and dry.  Do not get this area wet for 24 hours. No showers for 24 hours; you may shower on 07/20/15     . - The tape/steri-strips on your wound will fall off; do not pull them off.  No bandage is needed on the site.  DO  NOT apply any creams, oils, or ointments to the wound area. - If you notice any drainage or discharge from the wound, any swelling or bruising at the site, or you develop a fever > 101? F after you are discharged home, call the office at once.  Special Instructions - You are still able to use cellular telephones; use the ear opposite the side where you have your pacemaker/defibrillator.  Avoid carrying your cellular phone near your device. - When traveling through airports, show security personnel your identification card to avoid being screened in the metal detectors.  Ask the security personnel to use the hand wand. - Avoid arc welding equipment, MRI testing (magnetic resonance imaging), TENS units (transcutaneous nerve stimulators).  Call the office for questions about other devices. - Avoid electrical appliances that are in poor condition or are not properly grounded. - Microwave ovens are safe to be near or to operate.  Additional information for defibrillator patients should your device go off: - If your device goes off ONCE and you feel fine afterward, notify the device clinic nurses. - If your device goes off ONCE and you do not feel well afterward, call 911. - If your device  goes off TWICE, call 911. - If your device goes off THREE times in one day, call 911.  DO NOT DRIVE YOURSELF OR A FAMILY MEMBER WITH A DEFIBRILLATOR TO THE HOSPITAL--CALL 911.

## 2015-07-19 NOTE — Progress Notes (Signed)
After getting order for oxycodone for pain post-ICD insertion, the patient stated that she had taken percocet in the past and had an unpleasant reaction to it and would like to avoid that medication. Percocet was added to allergies with a comment regarding her prior reaction. The oxycodone was discontinued. Patient states that she would just like to take tylenol again for pain management. Will continue to monitor.

## 2015-07-19 NOTE — H&P (View-Only) (Signed)
Electrophysiology Office Note   Date:  06/21/2015   ID:  Joanne Bell, DOB 06-27-1960, MRN [id number]  PCP:  [name], MD  Cardiologist:    Primary Electrophysiologist: sk   Chief Complaint  Patient presents with  . Shortness of Breath    a little, dizzy sometimes when laying down     History of Present Illness: Joanne Bell is a 55 y.o. female who presents today for electrophysiology  followup for a pacemaker implanted December 2012 for complete heart block  Her workup for underlying causes was negative including  MRI  echo Lyme titers and chest x-ray these were all normal. EF was normal by MRI 12/12   She has underlying hypertension  She was seen 2/17. Notwithstanding the absence of symptoms, we undertook echocardiogram demonstrated ejection fraction 30-35% representating significant interval deterioration from 1/15 with EF of 45-50%.  I had Dr. Reinaldo Berber review the echo and he felt strongly that the ejection fraction measurement was correct.   She has significant snoring sleep disordered breathing and daytime somnolence. This is been getting worse.\  She has recently noted some dyspnea on exertion  Past Medical History  Diagnosis Date  . Atrioventricular block, complete (HCC)     narrow QRS  . Hypertension   . Fibroids, intramural   . Bilateral ovarian cysts   . Colon polyps   . Pacemaker     Medtronic REVO   Past Surgical History  Procedure Laterality Date  . Myomectomy  1998    per Dr. Garwin Brothers  . Endometrial ablation  2004  . Colonoscopy  2010    benign polyps, repeat in 5 yrs  . Permanent pacemaker insertion Left 02/16/2011    Procedure: PERMANENT PACEMAKER INSERTION;  Surgeon: Deboraha Sprang, MD;  Location: [facility] CATH LAB;  Service: Cardiovascular;  Laterality: Left;     Current Outpatient Prescriptions  Medication Sig Dispense Refill  . amLODipine (NORVASC) 10 MG tablet TAKE 1 TABLET (10 MG TOTAL) BY MOUTH DAILY. 90 tablet 1  . losartan  (COZAAR) 100 MG tablet TAKE 1 TABLET (100 MG TOTAL) BY MOUTH DAILY. 30 tablet 11  . omeprazole (PRILOSEC) 40 MG capsule Take 1 capsule by mouth daily.  2  . vitamin E 100 UNIT capsule Take 100 Units by mouth daily.     No current facility-administered medications for this visit.    Allergies:   Review of patient's allergies indicates no known allergies.   Social History:  The patient  reports that she has never smoked. She has never used smokeless tobacco. She reports that she does not drink alcohol or use illicit drugs.   Family History:  [name] family history includes Arthritis in her father; Depression in her mother; Diabetes in her father; Hyperlipidemia in her father; Hypertension in her father; Stroke in her father.    ROS:  Please see the history of present illness.   Otherwise, review of systems is positive for noen.   All other systems are reviewed and negative.    PHYSICAL EXAM: VS:  BP 162/98 mmHg  Pulse 64  Ht 5\' 4"  (1.626 m)  Wt 152 lb 6.4 oz (69.128 kg)  BMI 26.15 kg/m2 , BMI Body mass index is 26.15 kg/(m^2). GEN: Well nourished, well developed, in no acute distress HEENT: normal Neck: supple  MS: no deformity or atrophy Skin: warm and dry,    Neuro:  Strength and sensation are intact Psych: euthymic mood, full affect  EKG:  EKG is ordered today.  The ekg ordered today shows P-synchronous/ AV  pacing     Device interrogation is reviewed today in detail.  See PaceArt for details.   Recent Labs: 04/24/2015: BUN 14; Creat 0.76; Hemoglobin 11.7*; Platelets 297; Potassium 3.6; Sodium 141    Lipid Panel     Component Value Date/Time   CHOL 168 08/06/2010 1023   TRIG 160.0* 08/06/2010 1023   HDL 47.20 08/06/2010 1023   CHOLHDL 4 08/06/2010 1023   VLDL 32.0 08/06/2010 1023   LDLCALC 89 08/06/2010 1023     Wt Readings from Last 3 Encounters:  06/21/15 152 lb 6.4 oz (69.128 kg)  05/14/15 152 lb 9.6 oz (69.219 kg)  05/01/15 150 lb (68.04 kg)      Other studies Reviewed:     ASSESSMENT AND PLAN:  Complete heart block  Hypertension  sleep disordered breathing and daytime somnolence  Cardiomyopathy  Pacemaker-Medtronic  [name] device was interrogated.  The information was reviewed. No changes were made in the programming.     Blood pressure is elevated  We have discussed the potential implications related to cardiomyopathy in the context of percent ventricular pacing. 1 hypothesis is that his pacemaker derived. The other hypothesis would be that whatever was the cause of the heart block as a cardiomyopathic process might also be vomiting. I will review the echo with colleagues directly. If it is clearly 30-to 40% range, CRT P or D upgrade would be indicated we have discussed the fact that there was an underlying cardiomyopathy prior to pacemaker implantation. This raises the concern that the LV dysfunction that has been progressive is not simply related to the pacing but potentially also a cardiomyopathic process worsening.  Hence, we discussed CRT-D versus CRT P and we will proceed with the former.  In addition, we will work on discontinuing her amlodipine and having bilateral therapy for cardiomyopathy ie  carvedilol 6.25 mg twice daily.   We spent more than 50% of our >25 min visit in face to face counseling regarding the above   sleep study pending 4/17   Current medicines are reviewed at length with the patient today.   The patient is does not have concerns regarding her medicines.  The following changes were made today:      Disposition:   Will proceed with CRT-D upgrade Have reviewed the potential benefits and risks of ICD implantation including but not limited to death, perforation of heart or lung, lead dislodgement, infection,  device malfunction and inappropriate shocks.  The patient and family express understanding  and are willing to proceed.       We spent more than 50% of our >25 min visit in face to  face counseling regarding the above   Signed, [name], MD  06/21/2015 8:52 AM      [place] [address] [phone] (office) [phone] (fax)

## 2015-07-20 ENCOUNTER — Other Ambulatory Visit: Payer: Self-pay

## 2015-07-20 ENCOUNTER — Encounter (HOSPITAL_COMMUNITY): Payer: Self-pay | Admitting: Internal Medicine

## 2015-07-20 ENCOUNTER — Ambulatory Visit (HOSPITAL_COMMUNITY): Payer: BLUE CROSS/BLUE SHIELD

## 2015-07-20 DIAGNOSIS — I110 Hypertensive heart disease with heart failure: Secondary | ICD-10-CM | POA: Diagnosis not present

## 2015-07-20 DIAGNOSIS — I5022 Chronic systolic (congestive) heart failure: Secondary | ICD-10-CM

## 2015-07-20 DIAGNOSIS — Z95810 Presence of automatic (implantable) cardiac defibrillator: Secondary | ICD-10-CM

## 2015-07-20 DIAGNOSIS — I442 Atrioventricular block, complete: Secondary | ICD-10-CM | POA: Diagnosis not present

## 2015-07-20 DIAGNOSIS — I517 Cardiomegaly: Secondary | ICD-10-CM | POA: Diagnosis not present

## 2015-07-20 DIAGNOSIS — I428 Other cardiomyopathies: Secondary | ICD-10-CM

## 2015-07-20 DIAGNOSIS — I429 Cardiomyopathy, unspecified: Secondary | ICD-10-CM | POA: Diagnosis not present

## 2015-07-20 DIAGNOSIS — R400 Somnolence: Secondary | ICD-10-CM | POA: Diagnosis not present

## 2015-07-20 DIAGNOSIS — G4730 Sleep apnea, unspecified: Secondary | ICD-10-CM | POA: Diagnosis not present

## 2015-07-20 HISTORY — DX: Presence of automatic (implantable) cardiac defibrillator: Z95.810

## 2015-07-20 HISTORY — DX: Other cardiomyopathies: I42.8

## 2015-07-20 MED ORDER — ACETAMINOPHEN 325 MG PO TABS: 325.0000 mg | ORAL_TABLET | ORAL | Status: DC | PRN

## 2015-07-20 MED ORDER — CARVEDILOL 6.25 MG PO TABS: 9.3750 mg | ORAL_TABLET | Freq: Two times a day (BID) | ORAL | Status: DC

## 2015-07-20 MED ORDER — LOSARTAN POTASSIUM 50 MG PO TABS: 50.0000 mg | ORAL_TABLET | Freq: Every day | ORAL | Status: DC

## 2015-07-20 NOTE — Op Note (Signed)
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  POSTOPERATIVE DIAGNOSIS:  Nonischemic cardiomyopathy in the context of permanent pacing.  PROCEDURE:  Explantation of a previously implanted pacemaker, insertion of defibrillator, insertion of left ventricular lead and high-voltage lead assessment, repair of the atrial lead.  DESCRIPTION OF PROCEDURE:  Following obtaining informed consent, the patient was brought to electrophysiology laboratory and placed on the fluoroscopic table in the supine position.  After routine prep and drape, an incision was made over the line of the previous incision and carried a little bit lateral to allow for access.  Access was obtained without difficulty.  Two separate venipunctures were accomplished. Guidewires were placed and retained.  Sequentially, a 9 and 9.5 French sheath were placed.  The 9-French sheath had been placed for a long sheath because of some narrowing not evident on a venogram at the innominate SVC junction.  A Medtronic 6935-lead, serial #[id number] was passed under fluoroscopic guidance to the RV distal septum where fluoroscopy demonstrated about 1 cm spacing between it and the previously implanted pacemaker lead.  The paced R-wave was 7 with a pace impedance of 950, a threshold less than 1 V at 0.5 milliseconds.  There was no diaphragmatic pacing at 10 V, and the current of injury was brisk.  We then obtained access to the coronary sinus.  A noncontrast venogram demonstrated a high lateral branch, which was then targeted.  A 4598 Medtronic lead, serial #[id number] was  passed until the tip was at the middle portion of the distal apical third.  WE THUS EXCLUDED POLE 1. Thresholds were not great, but the position was outstanding so we ended up using the 2-3 configuration.  At that point, the threshold was less than 3 with impedance of 875 and a pulse width of 0.5 millisecond. There was no diaphragmatic pacing at 10 V.  This lead was secured to the prepectoral fascia following removal of the CS deployment system.  We then opened up the pacemaker pocket and excavated the device. Unfortunately, the leads were heavily scarred, and we needed to expand the pocket, or else we had to excavate the leads from the scar tissue. We then expanded the pocket caudally and inserted the leads to a Medtronic Viva ICD, serial #[id number] device.  The bipolar P-wave was 3 with a pace impedance of 418, and threshold 0.5 V at 0.4 milliseconds. There was no intrinsic R-wave.  The impedance in the RV lead was 475, a threshold of 0.5 at 0.4.  The LV impedance was 551 with a threshold 132, configuration 2.25 at 0.8.  We ended up using the high-voltage RV lead because we had a DF4 lead.  I should note that the atrial lead was discolored as was the ventricular lead from the pacemaker.  The ventricular lead was capped, the atrial  lead was repaired with medical adhesive.  The pocket was then copiously irrigated with antibiotic-containing saline solution.  Surgicel was placed on the posterior and cephalad aspects of the pocket.  The leads and the pulse generator were placed in the pocket and secured to the prepectoral fascia.  The wound was then closed in 2 layers in normal fashion.  The wound was washed, dried, and a Dermabond dressing was applied.  Needle counts, sponge counts, instrument counts were correct at the end of the procedure.     Deboraha Sprang, MD, [facility]     SCK/MEDQ  D:  07/19/2015  T:  07/20/2015  Job:  [id number]

## 2015-07-20 NOTE — Discharge Summary (Signed)
     Patient ID: Joanne Bell,  MRN: [id number], DOB/AGE: Aug 05, 1960 55 y.o.  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 Work up including MRI and Lyme titer was negative. Her LVF was normal then. She has done well but has developed apparent NICM with a recent EF of 30% by echo Feb 2017. Dr Joanne Bell discussed results and implications of the echo with the pt and it was decided to proceed with a MDT BiV ICD upgrade. This was done 07/19/15. Dr Joanne Bell saw the pt on the morning of 07/20/15 and feels she can be discharged. F/U in device clinic has been arranged. Her medications have been adjusted as per Dr Joanne Bell instructions.    Discharge Vitals:  Blood pressure 136/84, pulse 65, temperature 98.1 F (36.7 C), temperature source Oral, resp. rate 25, height 5\' 4"  (1.626 m), weight 155 lb 3.2 oz (70.398 kg), SpO2 97 %.    Labs: No results found for this or any previous visit (from the past 24 hour(s)).  Disposition:  Follow-up Information    Follow up with [facility] On 08/01/2015.   Specialty:  Cardiology   Why:  9:00AM, wound check   Contact information:   [address] [place] [phone]      Follow up with Joanne Axe, MD On 10/25/2015.   Specialty:  Cardiology   Why:  8:30AM   Contact information:   [id number] [address] [phone]       Discharge  Medications:    Medication List    TAKE these medications        acetaminophen 325 MG tablet  Commonly known as:  TYLENOL  Take 1-2 tablets (325-650 mg total) by mouth every 4 (four) hours as needed for mild pain.     carvedilol 6.25 MG tablet  Commonly known as:  COREG  Take 1.5 tablets (9.375 mg total) by mouth 2 (two) times daily.     losartan 50 MG tablet  Commonly known as:  COZAAR  Take 1 tablet (50 mg total) by mouth daily.     omeprazole 40 MG capsule  Commonly known as:  PRILOSEC  Take 40 mg by mouth daily as needed (for acid reflux or heartburn).     vitamin E 100 UNIT capsule  Take 100 Units by mouth daily as needed (for supplementation).         Duration of Discharge Encounter: Greater than 30 minutes including physician time.  Joanne Form PA-C 07/20/2015 10:03 AM

## 2015-07-20 NOTE — Progress Notes (Signed)
Discharge instructions given questions answered. Site OTA clean dry, Activity instructions given. Belongings packed up.

## 2015-07-20 NOTE — Progress Notes (Signed)
        Patient Name: Joanne Bell      SUBJECTIVE: feels better Some soreness  Past Medical History  Diagnosis Date  . Atrioventricular block, complete (HCC)     narrow QRS  . Hypertension   . Fibroids, intramural   . Bilateral ovarian cysts   . Colon polyps   . Pacemaker     Medtronic REVO  . 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 NICM (nonischemic cardiomyopathy) ([place]) 07/20/2015    Scheduled Meds:  Scheduled Meds: . carvedilol  6.25 mg Oral BID  . losartan  50 mg Oral Daily  . pantoprazole  40 mg Oral Daily   Continuous Infusions:  acetaminophen, ondansetron (ZOFRAN) IV, vitamin E    PHYSICAL EXAM Filed Vitals:   07/19/15 2115 07/20/15 0000 07/20/15 0023 07/20/15 0237  BP: 122/78 127/74 127/74 136/84  Pulse: 61 66 61 65  Temp:   97.9 F (36.6 C) 98.1 F (36.7 C)  TempSrc:   Oral Oral  Resp: 18 18 25    Height:      Weight:    155 lb 3.2 oz (70.398 kg)  SpO2: 100% 100% 98% 97%    Well developed and nourished in no acute distress HENT normal Neck supple with JVP-flat Clear Regular rate and rhythm, no murmurs or gallops Abd-soft with active BS No Clubbing cyanosis edema Skin-warm and dry A & Oriented  Grossly normal sensory and motor function Pocket without hematoma, swelling or tenderness  TELEMETRY: Reviewed telemetry pt in P-synchronous/ AV  pacing :    Intake/Output Summary (Last 24 hours) at 07/20/15 0902 Last data filed at 07/19/15 2126  Gross per 24 hour  Intake    100 ml  Output   1250 ml  Net  -1150 ml    LABS: Basic Metabolic Panel: No results for input(s): NA, K, CL, CO2, GLUCOSE, BUN, CREATININE, CALCIUM, MG, PHOS in the last 168 hours. Cardiac Enzymes: No results for input(s): CKTOTAL, CKMB, CKMBINDEX, TROPONINI in the last 72 hours. CBC: No results for input(s): WBC, NEUTROABS, HGB, HCT, MCV, PLT in the last 168 hours. PROTIME: No results for input(s): LABPROT, INR in  the last 72 hours. Liver Function Tests: No results for input(s): AST, ALT, ALKPHOS, BILITOT, PROT, ALBUMIN in the last 72 hours. No results for input(s): LIPASE, AMYLASE in the last 72 hours. BNP: BNP (last 3 results) No results for input(s): BNP in the last 8760 hours.  ProBNP (last 3 results) No results for input(s): PROBNP in the last 8760 hours.  D-Dimer: No results for input(s): DDIMER in the last 72 hours. Hemoglobin A1C: No results for input(s): HGBA1C in the last 72 hours. Fasting Lipid Panel: No results for input(s): CHOL, HDL, LDLCALC, TRIG, CHOLHDL, LDLDIRECT in the last 72 hours. Thyroid Function Tests: No results for input(s): TSH, T4TOTAL, T3FREE, THYROIDAB in the last 72 hours.  Invalid input(s): FREET3 Anemia Panel: No results for input(s): VITAMINB12, FOLATE, FERRITIN, TIBC, IRON, RETICCTPCT in the last 72 hours.   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

## 2015-07-22 ENCOUNTER — Encounter (HOSPITAL_COMMUNITY): Payer: Self-pay | Admitting: Internal Medicine

## 2015-08-01 ENCOUNTER — Ambulatory Visit (INDEPENDENT_AMBULATORY_CARE_PROVIDER_SITE_OTHER): Payer: BLUE CROSS/BLUE SHIELD | Admitting: *Deleted

## 2015-08-01 VITALS — BP 132/82

## 2015-08-01 DIAGNOSIS — I442 Atrioventricular block, complete: Secondary | ICD-10-CM

## 2015-08-01 NOTE — Progress Notes (Signed)
Patient is self referred at ICD support group meeting.   Met patient in the office during device wound check visit.  Explained ICM program and she agreed to monthly ICM follow ups.  BiV upgrade procedure was 07/19/2015.   Scheduled 1st ICM remote transmission on 09/04/2015.

## 2015-08-01 NOTE — Progress Notes (Signed)
CRT-D wound check in office. Dermabond removed. Wound without redness or edema. Incision edges approximated, wound well healed. Normal device function. Thresholds, sensing, and impedances consistent with implant measurements. No mode switch episodes recorded. No ventricular arrhythmia episodes recorded. Patient bi-ventricularly pacing 99% of the time. RV and LV programmed at 3.50V and 3.00V respectively for safety margin. Audible alerts demonstrated for patient. No changes made this session. Patient educated about wound care, arm mobility, lifting restrictions, shock plan. ROV with SK 10/15/2015.

## 2015-08-07 ENCOUNTER — Telehealth: Payer: Self-pay | Admitting: Internal Medicine

## 2015-08-07 ENCOUNTER — Encounter: Payer: Self-pay | Admitting: *Deleted

## 2015-08-07 NOTE — Telephone Encounter (Signed)
NEW MESSAGE   Pt wants to speak to rn because she needs to know if she is cleared to go back to work

## 2015-08-07 NOTE — Telephone Encounter (Signed)
Spoke with pt, she is clear to return to work. Work note placed at the front desk for pt pick up.

## 2015-08-07 NOTE — Telephone Encounter (Signed)
Spoke with pt, aware she can not do zumba for 6 weeks post implant.  Okay given for pt to walk.

## 2015-08-07 NOTE — Telephone Encounter (Signed)
New message    Patient calling wants to know can she do exercise , zumba class.

## 2015-08-22 ENCOUNTER — Telehealth: Payer: Self-pay | Admitting: Internal Medicine

## 2015-08-22 NOTE — Telephone Encounter (Signed)
I left a message for the patient to call. 

## 2015-08-22 NOTE — Telephone Encounter (Signed)
New Message:  Pt called in stating that since she has been taking the Carvedilol, her BP has to seemed to stay elevated and she would like to come in to have her BP checked. Please f/u with her

## 2015-08-23 ENCOUNTER — Ambulatory Visit (INDEPENDENT_AMBULATORY_CARE_PROVIDER_SITE_OTHER): Payer: BLUE CROSS/BLUE SHIELD | Admitting: *Deleted

## 2015-08-23 ENCOUNTER — Telehealth: Payer: Self-pay | Admitting: Family Medicine

## 2015-08-23 VITALS — BP 160/98

## 2015-08-23 DIAGNOSIS — I10 Essential (primary) hypertension: Secondary | ICD-10-CM

## 2015-08-23 NOTE — Telephone Encounter (Signed)
[facility] - Client Joanne Bell Call Center Patient Name: [facility] DOB: [birth year] Initial Comment Caller's BP medication changed and now BP is elevated (166/92) Nurse Assessment Nurse: Martyn Ehrich, RN, Felicia Date/Time (Eastern Time): 08/23/2015 3:51:35 PM Confirm and document reason for call. If symptomatic, describe symptoms. You must click the next button to save text entered. ---Dr. Jens Som changed her BP medication (her cardiologist). 1 month ago she had pacemaker replaced with a defibrilator in it. At that time her cardiologist changed her BP medication. Her Blood pressure has been up since she has been on the new medication. Last 2 weeks BP is staying up in 166/90's. She was just at Dr. Danie Chandler office to compare home cuff with office cuff and 1 hour ago she was 166/92. That nurse was just going to send message to cardiologistHas the patient traveled out of the country within the last 30 days? ---No Does the patient have any new or worsening symptoms? ---Yes Will a triage be completed? ---Yes Related visit to physician within the last 2 weeks? ---No Does the PT have any chronic conditions? (i.e. diabetes, asthma, etc.) ---Yes List chronic conditions. 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 ---No Guidelines Guideline Title Affirmed Question Affirmed Notes High Blood Pressure BP # 160/100 Final Disposition User See PCP When Office is Open (within 3 days) Gaddy, RN, FeliciaComments work no. is [phone] is work no. per family who is not with pt. Told them if they here from pt before nurse does tell her to call back asked caller if her BP was managed by cardiology or Dr. Sarajane Jews - she says 4 yrs ago Dr. Jens Som was switching up her medications and Dr. Sarajane Jews was also so she had a conference call and they  determined that Dr. Sarajane Jews would manage her BP. That was fine until this year her heart muscle was getting weaker and she went to [facility] and then he started adjusting BP medication - so she wants Sarajane Jews to manage the BP. told her if any chest pain or trouble breathing dizzy or vision blurry call back - if chest pain for 5 min call ambulance - referenced chest pain triage guide - no chest pain. Just mild intermittent HAReferrals REFERRED TO PCP OFFICE Disagree/Comply: Comply Call Id: [id number]

## 2015-08-23 NOTE — Telephone Encounter (Signed)
Called patient cell number and home number. Left a voicemail message asking her to return our call to get scheduled to be seen in the office at the beginning of the week next week. Awaiting return phone call.

## 2015-08-23 NOTE — Telephone Encounter (Signed)
Left message for patient to call back and advised in message that Nira Conn is out of the office and to call back and ask for triage

## 2015-08-23 NOTE — Telephone Encounter (Signed)
934am 08-23-15 pt returning Heather's call -pls call

## 2015-08-23 NOTE — Telephone Encounter (Signed)
Spoke with patient who states she is having high BP.  She states readings have been:  166/97 and 147/82 recently.  Complains of waking up with a headache daily.  She takes her BP several times per day at various times.  States BP yesterday was 166/92 1 hour after medication.  I asked her about hx of snoring and/or sleep apnea and she states she had a sleep test years ago but was not prescribed cpap.  She states she has no idea what her heart rate has been.  I advised that I can send a message to Dr. [name] who is working in the hospital today and that he may want to increase her medication dosage.  She states she would like to come in for a BP check today before increasing medication and she does not want to have to see Dr. [name] unless absolutely necessary because it costs her $75.  I advised that I can schedule a nurse visit but that I do not know what her insurance will charge her.  She verbalized understanding and agreement.

## 2015-08-23 NOTE — Telephone Encounter (Signed)
See nurse room visit 

## 2015-08-23 NOTE — Progress Notes (Signed)
PT CAME IN  FOR  B/P CHECK  PER  DR KLEIN'S  REQUEST .STATES  HOME MONITOR IS  NOT  ACCURATE.SEE  VITAL  SIGN AREA.PER PT  SINCE MED  CHANGE  B/P  HAS NOT BEEN UNDER CONTROL  WILL FORWARD  TO DR  [name] FOR REVIEW .Adonis Housekeeper

## 2015-08-26 ENCOUNTER — Encounter: Payer: Self-pay | Admitting: Family Medicine

## 2015-08-26 ENCOUNTER — Ambulatory Visit (INDEPENDENT_AMBULATORY_CARE_PROVIDER_SITE_OTHER): Payer: BLUE CROSS/BLUE SHIELD | Admitting: Family Medicine

## 2015-08-26 VITALS — BP 165/94 | HR 79 | Temp 98.2°F | Ht 64.0 in | Wt 159.0 lb

## 2015-08-26 DIAGNOSIS — I442 Atrioventricular block, complete: Secondary | ICD-10-CM

## 2015-08-26 DIAGNOSIS — I10 Essential (primary) hypertension: Secondary | ICD-10-CM

## 2015-08-26 DIAGNOSIS — I5022 Chronic systolic (congestive) heart failure: Secondary | ICD-10-CM | POA: Diagnosis not present

## 2015-08-26 MED ORDER — CARVEDILOL 12.5 MG PO TABS: 12.5000 mg | ORAL_TABLET | Freq: Two times a day (BID) | ORAL | Status: DC

## 2015-08-26 NOTE — Progress Notes (Signed)
   Subjective:    Patient ID: Joanne Bell, female    DOB: June 05, 1960, 55 y.o.   MRN: [id number]  HPI Here to follow up on BP. She saw Dr. [name] in April for a pacemaker check and they also discussed her cardiomyopathy at that time. Her last ECHO showed a decrease in LVEF to 30-35%. Her Amlodipine was stopped and she was put on Carvedilol. Since then her BP has increased to where the systolic readings are consistently in the 160s. She does complain of mild headaches at times, no chest pain or SOB. No swelling in the hands or feet. Also she had been scheduled for a sleep study in April but she cancelled this.    Review of Systems  Constitutional: Negative.   Respiratory: Negative.   Cardiovascular: Negative.   Neurological: Positive for headaches.       Objective:   Physical Exam  Constitutional: She is oriented to person, place, and time. She appears well-developed and well-nourished.  Cardiovascular: Normal rate, regular rhythm, normal heart sounds and intact distal pulses.   Pulmonary/Chest: Effort normal and breath sounds normal.  Musculoskeletal: She exhibits no edema.  Neurological: She is alert and oriented to person, place, and time.          Assessment & Plan:  Her pacemaker is working fine. Cardiology is following the cardiomyopathy. As far as the BP goes, we will increase the Carvedilol to 12.5 mg bid. Recheck one month. I also encouraged her to reschedule the sleep study.  [name], MD

## 2015-08-26 NOTE — Progress Notes (Signed)
Pre visit review using our clinic review tool, if applicable. No additional management support is needed unless otherwise documented below in the visit note. 

## 2015-08-27 NOTE — Telephone Encounter (Signed)
Reviewing the patient's chart, she did follow up with Dr. Sarajane Jews on 08/26/15 for her blood pressure.  Assessment & Plan:  Her pacemaker is working fine. Cardiology is following the cardiomyopathy. As far as the BP goes, we will increase the Carvedilol to 12.5 mg bid. Recheck one month. I also encouraged her to reschedule the sleep study.  [name], MD

## 2015-08-30 ENCOUNTER — Encounter: Payer: BLUE CROSS/BLUE SHIELD | Admitting: Family Medicine

## 2015-08-30 DIAGNOSIS — Z0289 Encounter for other administrative examinations: Secondary | ICD-10-CM

## 2015-08-30 NOTE — Progress Notes (Signed)
Pre visit review using our clinic review tool, if applicable. No additional management support is needed unless otherwise documented below in the visit note. 

## 2015-09-01 NOTE — Progress Notes (Signed)
This encounter was created in error - please disregard.

## 2015-09-02 ENCOUNTER — Ambulatory Visit (INDEPENDENT_AMBULATORY_CARE_PROVIDER_SITE_OTHER): Payer: BLUE CROSS/BLUE SHIELD | Admitting: Family Medicine

## 2015-09-02 ENCOUNTER — Encounter: Payer: Self-pay | Admitting: Family Medicine

## 2015-09-02 VITALS — BP 168/98 | HR 70 | Temp 98.2°F | Ht 64.0 in | Wt 159.4 lb

## 2015-09-02 DIAGNOSIS — I442 Atrioventricular block, complete: Secondary | ICD-10-CM | POA: Diagnosis not present

## 2015-09-02 DIAGNOSIS — I10 Essential (primary) hypertension: Secondary | ICD-10-CM

## 2015-09-02 DIAGNOSIS — I5022 Chronic systolic (congestive) heart failure: Secondary | ICD-10-CM | POA: Diagnosis not present

## 2015-09-02 NOTE — Patient Instructions (Signed)
Please follow up with Dr. Sarajane Jews this week. Also, bring BP cuff and readings with you to your visit for Dr. Sarajane Jews to review. If you notice any symptoms such as chest pain, palpitations, headaches, nosebleeds, numbness, tingling, swelling of feet, or any new symptoms, please seek care immediately.  DASH Eating Plan DASH stands for "Dietary Approaches to Stop Hypertension." The DASH eating plan is a healthy eating plan that has been shown to reduce high blood pressure (hypertension). Additional health benefits may include reducing the risk of type 2 diabetes mellitus, heart disease, and stroke. The DASH eating plan may also help with weight loss. WHAT DO I NEED TO KNOW ABOUT THE DASH EATING PLAN? For the DASH eating plan, you will follow these general guidelines:  Choose foods with a percent daily value for sodium of less than 5% (as listed on the food label).  Use salt-free seasonings or herbs instead of table salt or sea salt.  Check with your health care provider or pharmacist before using salt substitutes.  Eat lower-sodium products, often labeled as "lower sodium" or "no salt added."  Eat fresh foods.  Eat more vegetables, fruits, and low-fat dairy products.  Choose whole grains. Look for the word "whole" as the first word in the ingredient list.  Choose fish and skinless chicken or [place] more often than red meat. Limit fish, poultry, and meat to 6 oz (170 g) each day.  Limit sweets, desserts, sugars, and sugary drinks.  Choose heart-healthy fats.  Limit cheese to 1 oz (28 g) per day.  Eat more home-cooked food and less restaurant, buffet, and fast food.  Limit fried foods.  Cook foods using methods other than frying.  Limit canned vegetables. If you do use them, rinse them well to decrease the sodium.  When eating at a restaurant, ask that your food be prepared with less salt, or no salt if possible. WHAT FOODS CAN I EAT? Seek help from a dietitian for individual calorie  needs. Grains Whole grain or whole wheat bread. Brown rice. Whole grain or whole wheat pasta. Quinoa, bulgur, and whole grain cereals. Low-sodium cereals. Corn or whole wheat flour tortillas. Whole grain cornbread. Whole grain crackers. Low-sodium crackers. Vegetables Fresh or frozen vegetables (raw, steamed, roasted, or grilled). Low-sodium or reduced-sodium tomato and vegetable juices. Low-sodium or reduced-sodium tomato sauce and paste. Low-sodium or reduced-sodium canned vegetables.  Fruits All fresh, canned (in natural juice), or frozen fruits. Meat and Other Protein Products Ground beef (85% or leaner), grass-fed beef, or beef trimmed of fat. Skinless chicken or [place]. Ground chicken or [place]. Pork trimmed of fat. All fish and seafood. Eggs. Dried beans, peas, or lentils. Unsalted nuts and seeds. Unsalted canned beans. Dairy Low-fat dairy products, such as skim or 1% milk, 2% or reduced-fat cheeses, low-fat ricotta or cottage cheese, or plain low-fat yogurt. Low-sodium or reduced-sodium cheeses. Fats and Oils Tub margarines without trans fats. Light or reduced-fat mayonnaise and salad dressings (reduced sodium). Avocado. Safflower, olive, or canola oils. Natural peanut or almond butter. Other Unsalted popcorn and pretzels. The items listed above may not be a complete list of recommended foods or beverages. Contact your dietitian for more options. WHAT FOODS ARE NOT RECOMMENDED? Grains White bread. White pasta. White rice. Refined cornbread. Bagels and croissants. Crackers that contain trans fat. Vegetables Creamed or fried vegetables. Vegetables in a cheese sauce. Regular canned vegetables. Regular canned tomato sauce and paste. Regular tomato and vegetable juices. Fruits Dried fruits. Canned fruit in light or heavy syrup.  Fruit juice. Meat and Other Protein Products Fatty cuts of meat. Ribs, chicken wings, bacon, sausage, bologna, salami, chitterlings, fatback, hot dogs, bratwurst,  and packaged luncheon meats. Salted nuts and seeds. Canned beans with salt. Dairy Whole or 2% milk, cream, half-and-half, and cream cheese. Whole-fat or sweetened yogurt. Full-fat cheeses or blue cheese. Nondairy creamers and whipped toppings. Processed cheese, cheese spreads, or cheese curds. Condiments Onion and garlic salt, seasoned salt, table salt, and sea salt. Canned and packaged gravies. Worcestershire sauce. Tartar sauce. Barbecue sauce. Teriyaki sauce. Soy sauce, including reduced sodium. Steak sauce. Fish sauce. Oyster sauce. Cocktail sauce. Horseradish. Ketchup and mustard. Meat flavorings and tenderizers. Bouillon cubes. Hot sauce. Tabasco sauce. Marinades. Taco seasonings. Relishes. Fats and Oils Butter, stick margarine, lard, shortening, ghee, and bacon fat. Coconut, palm kernel, or palm oils. Regular salad dressings. Other Pickles and olives. Salted popcorn and pretzels. The items listed above may not be a complete list of foods and beverages to avoid. Contact your dietitian for more information. WHERE CAN I FIND MORE INFORMATION? National Heart, Lung, and Blood Institute: travelstabloid.com   This information is not intended to replace advice given to you by your health care provider. Make sure you discuss any questions you have with your health care provider.   Document Released: 02/12/2011 Document Revised: 03/16/2014 Document Reviewed: 12/28/2012 Elsevier Interactive Patient Education [employer].

## 2015-09-02 NOTE — Progress Notes (Signed)
Pre visit review using our clinic review tool, if applicable. No additional management support is needed unless otherwise documented below in the visit note. 

## 2015-09-02 NOTE — Progress Notes (Signed)
Subjective:    Patient ID: Joanne Bell, female    DOB: Mar 27, 1960, 55 y.o.   MRN: [id number]  HPI  Joanne Bell is a 55 year old female who presents today for blood pressure follow up. On 08/26/2015 her PCP increased her Carvedilol dose due to elevated BP readings. Previously, in April, she saw Dr. Caryl Bell for a pacemaker check and her discussion regarding her cardiomyopathy. He last ECHO showed a decrease in LVEF to 30-35%. Dr. Caryl Bell stopped her amlodipine and she was started on Carvedilol at that time.  She is monitoring her BP at home daily and she reports systolic of 123456 and diastolic of 0000000 to [id number]. She is checking her BP 4 to 5 times/day and reports that her readings are increasing during the day.  Denies chest pain, palpitations, SOB, dyspnea, edema, headaches, nosebleed, numbness, and tingling. Patient stated that she was here to have her BP monitor verified with our manual cuff and did not want any changes in her medication regimen. She is scheduling with her PCP in 4 days.  Review of Systems  Constitutional: Negative for fever, chills and fatigue.  Eyes: Negative for visual disturbance.  Respiratory: Negative for cough and shortness of breath.   Cardiovascular: Negative for chest pain, palpitations and leg swelling.  Gastrointestinal: Negative for nausea, vomiting, abdominal pain, diarrhea and constipation.  Musculoskeletal: Negative for myalgias and arthralgias.  Skin: Negative for rash.  Neurological: Negative for dizziness, light-headedness, numbness and headaches.   Past Medical History  Diagnosis Date  . Atrioventricular block, complete (HCC)     narrow QRS  . Hypertension   . Fibroids, intramural   . Bilateral ovarian cysts   . Colon polyps   . 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 NICM (nonischemic cardiomyopathy) ([place]) 07/20/2015  . Chronic systolic CHF (congestive heart failure), NYHA class 3 (HCC)      Social History     Social History  . Marital Status: Single    Spouse Name: N/A  . Number of Children: 2  . Years of Education: N/A   Occupational History  .      Secretary   Social History Main Topics  . Smoking status: Never Smoker   . Smokeless tobacco: Never Used  . Alcohol Use: No  . Drug Use: No  . Sexual Activity: Not on file   Other Topics Concern  . Not on file   Social History Narrative    Past Surgical History  Procedure Laterality Date  . Myomectomy  1998    per Dr. Garwin Brothers  . Endometrial ablation  2004  . Colonoscopy  2010    benign polyps, repeat in 5 yrs  . Permanent pacemaker insertion Left 02/16/2011    Procedure: PERMANENT PACEMAKER INSERTION;  Surgeon: Deboraha Sprang, MD;  Location[facility] C[facility] LAB;  Service: Cardiovascular;  Laterality: Left;  . Ep implantable device N/A 07/19/2015    Procedure: Upgrade to [place] ICD ;  Surgeon: Deboraha Sprang, MD;  Location: [place] CV LAB;  Service: Cardiovascular;  Laterality: N/A;    Family History  Problem Relation Age of Onset  . Depression Mother   . Arthritis Father   . Diabetes Father   . Hyperlipidemia Father   . Hypertension Father   . Stroke Father     Allergies  Allergen Reactions  . Acyclovir And Related   . Percocet [Oxycodone-Acetaminophen] Other (See Comments)    Dizziness, sweating, abdominal discomfort    Current Outpatient Prescriptions on  File Prior to Visit  Medication Sig Dispense Refill  . carvedilol (COREG) 12.5 MG tablet Take 1 tablet (12.5 mg total) by mouth 2 (two) times daily with a meal. 60 tablet 5  . losartan (COZAAR) 50 MG tablet Take 1 tablet (50 mg total) by mouth daily. 90 tablet 3  . omeprazole (PRILOSEC) 40 MG capsule Take 40 mg by mouth daily as needed (for acid reflux or heartburn).   2  . vitamin E 100 UNIT capsule Take 100 Units by mouth daily as needed (for supplementation).      No current facility-administered medications on file prior to visit.    BP 168/98 mmHg  Pulse 70   Temp(Src) 98.2 F (36.8 C) (Oral)  Ht 5\' 4"  (1.626 m)  Wt 159 lb 6 oz (72.292 kg)  BMI 27.34 kg/m2  SpO2 98%        Objective:   Physical Exam  Constitutional: She is oriented to person, place, and time. She appears well-developed and well-nourished.  Eyes: Pupils are equal, round, and reactive to light. No scleral icterus.  Neck: Neck supple.  Cardiovascular: Normal rate, regular rhythm and intact distal pulses.   Pulmonary/Chest: Effort normal and breath sounds normal. She has no wheezes.  Lymphadenopathy:    She has no cervical adenopathy.  Neurological: She is alert and oriented to person, place, and time.  Skin: Skin is warm and dry. No rash noted.  Psychiatric: She has a normal mood and affect. Her behavior is normal. Judgment and thought content normal.       Assessment & Plan:  1. Essential hypertension Discussed with patient the importance of keeping her BP <140/90. She declined a change in her medication regimen stating she will do this with her PCP that she plans to see in 4 days. Advised patient the risks of an elevated BP and the importance of seeking care if she has any symptoms of chest pain, palpitations, SOB, dyspnea, edema, numbness, tingling, headaches, or nosebleeds. Patient voiced understanding and agreed with plan.  2. Atrioventricular block, complete ([place])   3. Chronic systolic congestive heart failure, NYHA class 3 ([place])  Her pacemaker is working and she is followed by cardiology for cardiomyopathy.  Strongly advised that she make and keep the appointment with her PCP for BP evaluation and medication follow up. Provided DASH dietary recommendations and recommended that she follow this plan for assistance with BP control.  Follow up in 4 days with PCP as patient stated she will do.  Delano Metz, FNP-C

## 2015-09-04 ENCOUNTER — Ambulatory Visit (INDEPENDENT_AMBULATORY_CARE_PROVIDER_SITE_OTHER): Payer: BLUE CROSS/BLUE SHIELD

## 2015-09-04 ENCOUNTER — Telehealth: Payer: Self-pay

## 2015-09-04 DIAGNOSIS — I429 Cardiomyopathy, unspecified: Secondary | ICD-10-CM

## 2015-09-04 DIAGNOSIS — Z95810 Presence of automatic (implantable) cardiac defibrillator: Secondary | ICD-10-CM | POA: Diagnosis not present

## 2015-09-04 NOTE — Progress Notes (Signed)
EPIC Encounter for ICM Monitoring  Patient Name: Joanne Bell is a 55 y.o. female Date: 09/04/2015 Primary Care Physican: [name], MD Primary Cardiologist: [name] Electrophysiologist: [name] Dry Weight:  unknown  Bi-V Pacing 100%      In the past month, have you:  1. Gained more than 2 pounds in a day or more than 5 pounds in a week? N/A  2. Had changes in your medications (with verification of current medications)? N/A  3. Had more shortness of breath than is usual for you? N/A  4. Limited your activity because of shortness of breath? N/A  5. Not been able to sleep because of shortness of breath? N/A  6. Had increased swelling in your feet, ankles, legs or stomach area? N/A  7. Had symptoms of dehydration (dizziness, dry mouth, increased thirst, decreased urine output) N/A  8. Had changes in sodium restriction? N/A  9. Been compliant with medication? N/A  ICM trend: 3 month view for 09/04/2015   ICM trend: 1 year view for 09/04/2015   Follow-up plan: ICM clinic phone appointment 11/26/2015.  Defib office check with [name] on 10/25/2015.  1st ICM remote transmission.  Attempted call to patient and unable to reach.  Transmission reviewed.  FLUID LEVELS: Optivol thoracic impedance trending along baseline suggesting stable fluid levels.  Patient had gen change 07/19/2015.     [name], RN, CCM 09/04/2015 1:46 PM

## 2015-09-04 NOTE — Telephone Encounter (Signed)
1st Remote ICM transmission received.  Attempted patient call and left message for return call.

## 2015-09-06 ENCOUNTER — Ambulatory Visit (INDEPENDENT_AMBULATORY_CARE_PROVIDER_SITE_OTHER): Payer: BLUE CROSS/BLUE SHIELD | Admitting: Family Medicine

## 2015-09-06 ENCOUNTER — Encounter: Payer: Self-pay | Admitting: Family Medicine

## 2015-09-06 VITALS — BP 150/99 | HR 66 | Temp 98.5°F | Ht 64.0 in | Wt 161.0 lb

## 2015-09-06 DIAGNOSIS — I10 Essential (primary) hypertension: Secondary | ICD-10-CM

## 2015-09-06 MED ORDER — CLONIDINE HCL 0.1 MG PO TABS: 0.1000 mg | ORAL_TABLET | Freq: Two times a day (BID) | ORAL | Status: DC

## 2015-09-06 NOTE — Progress Notes (Signed)
Call to patient and left message stating Dr [name] recommendation is to schedule an appointment with PA or NP to help manage the BP.  Provided main office number.

## 2015-09-06 NOTE — Progress Notes (Signed)
Pre visit review using our clinic review tool, if applicable. No additional management support is needed unless otherwise documented below in the visit note. 

## 2015-09-06 NOTE — Progress Notes (Signed)
   Subjective:    Patient ID: Joanne Bell, female    DOB: 28-Mar-1960, 55 y.o.   MRN: [id number]  HPI Here to follow up on HTN. When we last met we increased her Carvedilol and this lowered her systolic readings about 10 mmHg. She feels better and the headaches are gone. She has been averaging in the 150s over 90s lately.    Review of Systems  Constitutional: Negative.   Respiratory: Negative.   Cardiovascular: Negative.   Neurological: Negative.        Objective:   Physical Exam  Constitutional: She is oriented to person, place, and time. She appears well-developed and well-nourished.  Cardiovascular: Normal rate, regular rhythm, normal heart sounds and intact distal pulses.   Pulmonary/Chest: Effort normal and breath sounds normal.  Musculoskeletal: She exhibits no edema.  Neurological: She is alert and oriented to person, place, and time.          Assessment & Plan:  HTN. Add Clonidine 0.1 mg bid. She will let us know in ine week how she is doing. [name], MD

## 2015-09-06 NOTE — Progress Notes (Addendum)
Received call back from patient.  She denied any fluid symptoms.  Advised that her device is still building a reference line and there was no fluid accumulation at this time.  She stated she has her blood pressure continues to be high even after some med changes from PCP visit on  and her PCP has referred her back to [name] to manage her BP issue.  She is frustrated that she is not able to get the BP lowered and PCP has referred her back to [name].   Advised would discuss with Dr [name] nurse.

## 2015-09-26 ENCOUNTER — Telehealth: Payer: Self-pay | Admitting: Internal Medicine

## 2015-09-26 ENCOUNTER — Telehealth: Payer: Self-pay | Admitting: Family Medicine

## 2015-09-26 NOTE — Telephone Encounter (Signed)
I called and spoke with the patient. She reports that she is having SOB with walking that started this morning. She denies feelings of heart racing/ palpitations.  She has some dizziness with lying down. She does report having HR's in the low 60's/ upper 50's that is different for her. I advised he to please send a transmission for the [facility] to review as I am uncertain what her SOB today is related to, but will rule out anything on her device.  She also complains of her BP being elevated- she is somewhat frustrated about trying to get in our office to manage her BP. Chart reviewed- Dr. Sarajane Jews saw her on 6/30 and added clonidine 0.1 mg BID. The patient called Dr. Barbie Banner office today and he increased her clonidine to 0.2 mg BID. I have advised her that since her BP is being managed by Dr. Sarajane Jews, Dr. [name] will recommend that her PCP continue to follow this. She inquired as to why Dr. [name] has adjusted her BP meds before. I advised her that he will adjust BP meds at times, but if blood pressure control becomes an ongoing problem, then he will defer to the PCP. She is uncomfortable with increasing clonidine as this "puts me out" at the 0.1 mg dose. I have advised the patient to call back to Dr. Barbie Banner office and advise them of this and see if there is another combination of medications to try. She is agreeable.  Again recommended her to send a transmission tomorrow. Will notify device clinic to look for this and please call her with results.

## 2015-09-26 NOTE — Telephone Encounter (Signed)
Tell her to increase the Clonidine to 2 pills at a time (total of 0.2 mg) BID, and call back in one week

## 2015-09-26 NOTE — Telephone Encounter (Signed)
I left a voice message for pt to return my call.  

## 2015-09-26 NOTE — Telephone Encounter (Signed)
New Message..   Pt c/o Shortness Of Breath: STAT if SOB developed within the last 24 hours or pt is noticeably SOB on the phone  1. Are you currently SOB (can you hear that pt is SOB on the phone)? No   2. How long have you been experiencing SOB? Yesterday   3. Are you SOB when sitting or when up moving around? Moving around   4. Are you currently experiencing any other symptoms? No

## 2015-09-26 NOTE — Telephone Encounter (Signed)
Pt saw dr fry on 09-06-15 and md added another bp med and pt was suppose to let md know in 1 wk . Pt bp today 164/104. Please advise

## 2015-09-27 NOTE — Telephone Encounter (Signed)
Transmission received. Normal device function, no episodes. Sinus with BiV pacing presenting. Thoracic impedance indicates fluid accumulation over the last week, returning to baseline at the time of transmission last night.  I called Joanne Bell, she is feeling slightly better today but is just now getting up and moving for the day.  I informed her of our findings. I educated her about limiting fluid intake to 2L or less daily (including ice cream, which she states she eats at least 3x daily) and limiting sodium intake to 2,000mg  or less daily. She verbalizes understanding but is quite shocked about the ice cream being included in her daily fluid intake- I reiterated that anything that is liquid at room temperature should be included in her 2L daily count.  I have offered her monthly ICM monitoring- she is interested. I have referred her to Sharman Cheek, RN. She called Dr. Barbie Banner office yesterday and reports that they left her a message today but she has yet to check it. She will follow up with them regarding her BP.

## 2015-09-27 NOTE — Telephone Encounter (Signed)
I left a voice message with below information and advised pt to return my call.  

## 2015-09-30 NOTE — Telephone Encounter (Signed)
I spoke with pt and went over below information. Pt will follow advice and continue to monitor blood pressure.

## 2015-10-02 ENCOUNTER — Encounter: Payer: Self-pay | Admitting: *Deleted

## 2015-10-02 ENCOUNTER — Telehealth: Payer: Self-pay | Admitting: Cardiology

## 2015-10-02 NOTE — Telephone Encounter (Signed)
New Message...  Pt c/o Shortness Of Breath: STAT if SOB developed within the last 24 hours or pt is noticeably SOB on the phone  1. Are you currently SOB (can you hear that pt is SOB on the phone)? No   2. How long have you been experiencing SOB? Pt stated yesterday afternoon off and on.   3. Are you SOB when sitting or when up moving around? Moving around   4. Are you currently experiencing any other symptoms? Pt states she cant tell if its sob or palpations

## 2015-10-02 NOTE — Telephone Encounter (Signed)
Pt states that she has been having SOB with exertion since yesterday.  Pt sent in transmission.  This was reviewed by Sharman Cheek, RN and pt was noted to not have fluid.  Pt denies swelling, CP, lightheadedness or dizziness.  Pt took meds this morning at 9AM.  BP 131/83, HR 55.  Pt was instructed recently to watch Na+ and fluid intake due to having extra fluid in June.  Pt states she has been trying to do both of these.  Spoke with Melina Copa, PA-C and she would like for pt to be seen in the next day or two.  Spoke with pt and scheduled her to see Almyra Deforest, PA-C tomorrow at [place] pt if SOB worsens or other symptoms begin to develop she should proceed to ER.  Pt verbalized understanding and was in agreement with this plan.

## 2015-10-03 ENCOUNTER — Encounter: Payer: Self-pay | Admitting: Physician Assistant

## 2015-10-03 ENCOUNTER — Ambulatory Visit (INDEPENDENT_AMBULATORY_CARE_PROVIDER_SITE_OTHER): Payer: BLUE CROSS/BLUE SHIELD | Admitting: Physician Assistant

## 2015-10-03 VITALS — BP 162/84 | HR 70 | Ht 64.0 in | Wt 157.4 lb

## 2015-10-03 DIAGNOSIS — Z950 Presence of cardiac pacemaker: Secondary | ICD-10-CM | POA: Diagnosis not present

## 2015-10-03 DIAGNOSIS — I442 Atrioventricular block, complete: Secondary | ICD-10-CM

## 2015-10-03 DIAGNOSIS — I10 Essential (primary) hypertension: Secondary | ICD-10-CM | POA: Diagnosis not present

## 2015-10-03 DIAGNOSIS — R0683 Snoring: Secondary | ICD-10-CM

## 2015-10-03 DIAGNOSIS — I428 Other cardiomyopathies: Secondary | ICD-10-CM

## 2015-10-03 DIAGNOSIS — I429 Cardiomyopathy, unspecified: Secondary | ICD-10-CM

## 2015-10-03 MED ORDER — LOSARTAN POTASSIUM 100 MG PO TABS: 100.0000 mg | ORAL_TABLET | Freq: Every day | ORAL | 3 refills | Status: DC

## 2015-10-03 NOTE — Patient Instructions (Addendum)
Medication Instructions:  1. INCREASE LOSARTAN TO 100 MG DAILY; NEW RX SENT IN TODAY  Labwork: NONE  Testing/Procedures: NONE  Follow-Up: KEEP YOUR APPT WITH DR. [name] AS PLANNED   Any Other Special Instructions Will Be Listed Below (If Applicable).  YOU WILL NEED A SLEEP STUDY; BETHANY COOK, CMA WILL CALL YOU TO SCHEDULE SLEEP STUDY   If you need a refill on your cardiac medications before your next appointment, please call your pharmacy.

## 2015-10-03 NOTE — Progress Notes (Addendum)
Cardiology Office Note    Date:  10/03/2015   ID:  Joanne Bell, DOB 03-29-1960, MRN [id number]  PCP:  [name], MD  Cardiologist:  Dr. [name]  Chief Complaint  Patient presents with  . Follow-up    seen for Dr. [name]    History of Present Illness:  Joanne Bell is a 55 y.o. female for evaluation of SOB. She has PMH of HTN and h/o complete HB s/p PPM. He had normal EF by MRI in 12/12. He had negative Lyme titer. In February 2017, he had echocardiogram that demonstrated EF 30-35% which has significantly deteriorated from previous echo in January 2015 that showed 45-50%. She does have significant snoring problem with daytime somnolence. Given the drop in ejection fraction, there was some concern of procedure ejection fraction related to pacing versus cardiomyopathic process, her device was eventually upgraded to MDT biventricular ICD on 07/19/2015.  She states she has been having intermittent shortness of breath. So far, she had one day of shortness of breath last week, she had episode of shortness of breath yesterday and today as well. She says the symptom is not really related to exertion and she could be sitting there when she had the shortness of breath. She also mentions occasionally her heart skips a beat. I have discussed with our device clinic, she has had 2 interrogation in the last week and her device was functioning normally. Medtronic Optivol does shows that she has been accumulating fluid last week, however her fluid level has returned back to normal this week.   Her blood pressure is uncontrolled for some reason. She says her blood pressure used to be good prior to ICD placement. However recently her systolic blood pressure has been ranging in the 160 to 170s range. Her amlodipine was discontinued. Her clonidine was increased to twice a day, however patient has been taking 2 tablet at night as daytime clonidine was causing too much drowsiness for her. Her blood pressure  is still high today, we will increase AM losartan to 100 mg daily. I am not entirely sure why she is having uncontrolled high blood pressure now. I think she may have obstructive sleep apnea as patient and her husband mentions she has been snoring more recently.   Past Medical History:  Diagnosis Date  . Atrioventricular block, complete (HCC)    narrow QRS  . Bilateral ovarian cysts   . 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 Chronic systolic CHF (congestive heart failure), NYHA class 3 ([place])   . Colon polyps   . Fibroids, intramural   . Hypertension   . NICM (nonischemic cardiomyopathy) ([place]) 07/20/2015    Past Surgical History:  Procedure Laterality Date  . COLONOSCOPY  2010   benign polyps, repeat in 5 yrs  . ENDOMETRIAL ABLATION  2004  . EP IMPLANTABLE DEVICE N/A 07/19/2015   Procedure: Upgrade to [place] ICD ;  Surgeon: Deboraha Sprang, MD;  Location: [facility];  Service: Cardiovascular;  Laterality: N/A;  . MYOMECTOMY  1998   per Dr. Garwin Brothers  . PERMANENT PACEMAKER INSERTION Left 02/16/2011   Procedure: PERMANENT PACEMAKER INSERTION;  Surgeon: Deboraha Sprang, MD;  [facility]: [facility] CATH LAB;  Service: Cardiovascular;  Laterality: Left;    Current Medications: Outpatient Medications Prior to Visit  Medication Sig Dispense Refill  . carvedilol (COREG) 12.5 MG tablet Take 1 tablet (12.5 mg total) by mouth 2 (two) times daily with a meal. 60 tablet 5  . cloNIDine (  CATAPRES) 0.1 MG tablet Take 1 tablet (0.1 mg total) by mouth 2 (two) times daily. 60 tablet 3  . omeprazole (PRILOSEC) 40 MG capsule Take 40 mg by mouth daily as needed (for acid reflux or heartburn).   2  . vitamin E 100 UNIT capsule Take 100 Units by mouth daily as needed (for supplementation).     Marland Kitchen losartan (COZAAR) 50 MG tablet Take 1 tablet (50 mg total) by mouth daily. 90 tablet 3   No facility-administered medications prior to visit.      Allergies:   Acyclovir and  related and Percocet [oxycodone-acetaminophen]   Social History   Social History  . Marital status: Single    Spouse name: N/A  . Number of children: 2  . Years of education: N/A   Occupational History  .  [employer]    Secretary   Social History Main Topics  . Smoking status: Never Smoker  . Smokeless tobacco: Never Used  . Alcohol use No  . Drug use: No  . Sexual activity: Not Asked   Other Topics Concern  . None   Social History Narrative  . None     Family History:  [name] family history includes Arthritis in her father; Depression in her mother; Diabetes in her father; Hyperlipidemia in her father; Hypertension in her father; Stroke in her father.   ROS:   Please see the history of present illness.    ROS All other systems reviewed and are negative.   PHYSICAL EXAM:   VS:  BP (!) 162/84   Pulse 70   Ht 5\' 4"  (1.626 m)   Wt 157 lb 6.4 oz (71.4 kg)   SpO2 (!) 70%   BMI 27.02 kg/m    GEN: Well nourished, well developed, in no acute distress  HEENT: normal  Neck: no JVD, carotid bruits, or masses Cardiac: RRR; no murmurs, rubs, or gallops,no edema  Respiratory:  clear to auscultation bilaterally, normal work of breathing GI: soft, nontender, nondistended, + BS MS: no deformity or atrophy  Skin: warm and dry, no rash Neuro:  Alert and Oriented x 3, Strength and sensation are intact Psych: euthymic mood, full affect  Wt Readings from Last 3 Encounters:  10/03/15 157 lb 6.4 oz (71.4 kg)  09/06/15 161 lb (73 kg)  09/02/15 159 lb 6 oz (72.3 kg)      Studies/Labs Reviewed:   EKG:  EKG is ordered today.  The ekg ordered today demonstrates paced rhythm with QRS 166ms  Recent Labs: 07/12/2015: BUN 17; Creat 0.94; Hemoglobin 11.5; Platelets 315; Potassium 3.7; Sodium 142   Lipid Panel    Component Value Date/Time   CHOL 168 08/06/2010 1023   TRIG 160.0 (H) 08/06/2010 1023   HDL 47.20 08/06/2010 1023   CHOLHDL 4 08/06/2010 1023   VLDL 32.0 08/06/2010  1023   LDLCALC 89 08/06/2010 1023    Additional studies/ records that were reviewed today include:   Echo 04/24/2015 LV EF: 30% -   35%  ------------------------------------------------------------------- Indications:      Dyspnea (r06.00).  ------------------------------------------------------------------- History:   PMH:  Acquired from the patient and from the patient&'s chart.  Syncope, dyspnea, and bilateral lower extremity edema. 3degrees AV block, which has been managed with permanent pacemaker implantation.  Risk factors:  Hypertension.  ------------------------------------------------------------------- Study Conclusions  - Left ventricle: The cavity size was normal. Wall thickness was   increased in a pattern of mild LVH. Systolic function was   moderately to severely reduced.  The estimated ejection fraction   was in the range of 30% to 35%. Hypokinesis of the inferior   myocardium. Severe hypokinesis of the apical myocardium. Doppler   parameters are consistent with abnormal left ventricular   relaxation (grade 1 diastolic dysfunction).    ASSESSMENT:    1. Hypertension, essential   2. Snoring   3. Complete heart block ([place])   4. Pacemaker   5. Nonischemic cardiomyopathy ([place])      PLAN:  In order of problems listed above:  1. SOB  - recent OptiVol Interrogation showed accumulation of fluid in the last 2 weeks however this week, her overall fluid status has returned back to normal. She does not have significant lower extremity edema and her lungs clear. She does not appears to be fluid overloaded on today's physical exam. I would recommend controlling her blood pressure further and continue to observe her shortness of breath for now.  2. Uncontrolled HTN: Her blood pressure is uncontrolled, unclear reason, systolic blood pressure in the 160-170 at home, today in the office, her blood pressure is 160s. She used to be on amlodipine, this has been switched to  clonidine 0.1 mg twice a day, although she has been taking her clonidine as 0.2 mg daily instead. I have asked her to check her blood pressure twice a day and keep a diary. We will increase date him losartan to 100 mg daily. Given her snoring, I am concerned that she may be having obstructive sleep apnea symptom, I will arrange outpatient sleep study.  3. H/o CHB s/p MDT BiV ICD: recent interrogation reviewed, device functioning normally.   4. Presumed NICM: although I do not see any ischemic workup in the EPIC. I think it was presumed that her progressive drop in EF was related to pacing activity given BBB and this is once of the reason she was upgraded to BiV ICD. She has never had CP, her symptom seems to be more SOB. May consider repeat echo and if EF still low, pursue myoview. Will defer this decision to primary cardiologist.    Addendum: O2 sat documented is wrong, it is not 70%, but 70 is the pulse. We rechecked her O2 sat, it was 98%. Low suspicion for PE.    Medication Adjustments/Labs and Tests Ordered: Current medicines are reviewed at length with the patient today.  Concerns regarding medicines are outlined above.  Medication changes, Labs and Tests ordered today are listed in the Patient Instructions below. Patient Instructions  Medication Instructions:  1. INCREASE LOSARTAN TO 100 MG DAILY; NEW RX SENT IN TODAY  Labwork: NONE  Testing/Procedures: NONE  Follow-Up: KEEP YOUR APPT WITH DR. [name] AS PLANNED   Any Other Special Instructions Will Be Listed Below (If Applicable).  YOU WILL NEED A SLEEP STUDY; BETHANY COOK, CMA WILL CALL YOU TO SCHEDULE SLEEP STUDY   If you need a refill on your cardiac medications before your next appointment, please call your pharmacy.      Hilbert Corrigan, [place]  10/03/2015 6:21 PM    [address], [place], [place]  [address] Phone: [phone]; Fax: [phone]

## 2015-10-04 ENCOUNTER — Telehealth: Payer: Self-pay | Admitting: Physician Assistant

## 2015-10-04 NOTE — Telephone Encounter (Signed)
Follow Up:     Pt  by Eulas Post yesterday. She says she is still having breathing problems,knee are hurting and feel anxious.I was told to send this note to triage.

## 2015-10-04 NOTE — Telephone Encounter (Signed)
SPOKE WITH PT .PT CALLED  BECAUSE PT  FEELS  THAT  SOB   WAS  NOT  ADDRESSED AT [facility] OFFICE  VISIT AND  FEELS  THAT  B/P  HAS  BEEN OUT  OF  CONTROL FOR  3 MONTHS  AND  SHOULD  HAVE  BEEN  FIXED BY NOW  DOES  NOT FEEL  THE  MED REGIMEN IS WORKING . PT  CLAIMS  THAT LOSARTAN,CLONIDINE. AND CARVEDILOL  CAN CAUSE  SOB AND  CLONIDINE CAN CAUSE  INSOMINIA  MEDS WERE RESEARCHED   AND  SOB  WAS  NOT LISTED  AS  CULPRIT. PT  AWARE  WILL FORWARD   MESSAGE  TO  HAO MENG PA FOR REVIEW AND  RECOMMENDATIONS .

## 2015-10-04 NOTE — Progress Notes (Signed)
Spoke with patient during office visit with Janan Ridge, PA on 7/127/2017.  Scheduled next ICM remote transmission for 10/15/2015 to recheck fluid levels.

## 2015-10-07 NOTE — Telephone Encounter (Signed)
Pt calling to update [name] on how she is doing since she spoke with him Friday. Pt states that she's still having SOB on exertion.  She was out watering plants this weekend and began having SOB but resolved.  She states that she does feel the SOB has slightly improved.  BP at 9AM today was 152/92 and had pt check it again while on the phone at 10:40AM it was 133/94.  Pt took meds around 9:30AM.  Pt states BP comes down after about 2 hours when she takes her meds but is back up around 8p-9p. She takes evening meds around 8:30p-9p. Denies lightheadedness, dizziness or HA when BP elevated.  Advised pt I would send this message to Almyra Deforest, PA-C for review and advisement.

## 2015-10-07 NOTE — Telephone Encounter (Signed)
Pt calling to follow up with Almyra Deforest.

## 2015-10-08 ENCOUNTER — Telehealth: Payer: Self-pay | Admitting: Physician Assistant

## 2015-10-08 ENCOUNTER — Encounter: Payer: Self-pay | Admitting: Internal Medicine

## 2015-10-08 ENCOUNTER — Other Ambulatory Visit: Payer: Self-pay | Admitting: Physician Assistant

## 2015-10-08 MED ORDER — AMLODIPINE BESYLATE 5 MG PO TABS: 5.0000 mg | ORAL_TABLET | Freq: Every day | ORAL | 3 refills | Status: DC

## 2015-10-08 NOTE — Telephone Encounter (Signed)
Mrs. Iannone called cardiology office for persistent hypertension, still uncontrolled with SBP 150s. Still has some SOB however improved since increasing losartan to 100mg . She says clonidine is not helping her. She says her BP was controlled previous on amlodipine 10mg  and losartan 100mg  daily. She attributed her onset of SOB to her admission in May when her losartan was cut to 50mg  daily and her amlodipine was switched to coreg.   I am hesitant to cut coreg as I think it is beneficial for her to be on coreg and losartan. She also says she cannot take clonidine during the day as "it knock me out". Given persistent uncontrolled BP, I will switch her back from clonidine to amlodipine. I will start at 5mg  daily for now, I asked her to monitor rebound hypertension, if BP still uncontrolled increase amlodipine to 10mg  daily.   I am not sure what's causing her SOB with walking. She will see Dr. [name] in 2 weeks, we may need to consider repeat echo to see her EF and also consider outpatient myoview. Despite having low EF, I could not find an ischemic workup before to r/o secondary causes. I think her recent drop EF was felt to be dyssynchrony from BBB.   Hilbert Corrigan PA Pager: [phone]

## 2015-10-09 NOTE — Telephone Encounter (Signed)
[name] returned pts call and started another encounter. Closing encounter.

## 2015-10-11 ENCOUNTER — Encounter: Payer: Self-pay | Admitting: Internal Medicine

## 2015-10-15 ENCOUNTER — Ambulatory Visit (INDEPENDENT_AMBULATORY_CARE_PROVIDER_SITE_OTHER): Payer: BLUE CROSS/BLUE SHIELD

## 2015-10-15 ENCOUNTER — Telehealth: Payer: Self-pay

## 2015-10-15 DIAGNOSIS — I429 Cardiomyopathy, unspecified: Secondary | ICD-10-CM

## 2015-10-15 DIAGNOSIS — Z95810 Presence of automatic (implantable) cardiac defibrillator: Secondary | ICD-10-CM | POA: Diagnosis not present

## 2015-10-15 DIAGNOSIS — I5022 Chronic systolic (congestive) heart failure: Secondary | ICD-10-CM | POA: Diagnosis not present

## 2015-10-15 NOTE — Progress Notes (Signed)
EPIC Encounter for ICM Monitoring  Patient Name: Joanne Bell is a 55 y.o. female Date: 10/15/2015 Primary Care Physican: [name], MD Primary Cardiologist: [name] Electrophysiologist: [name] Dry Weight: unknown Bi-V Pacing:  98.9%       Attempted patient call and unable to reach.  Transmission reviewed.   Thoracic impedance normal    ICM trend: 10/15/2015     Follow-up plan: ICM clinic phone appointment on 11/26/2015.  Office visit with [name] on 10/25/2015  Copy of ICM check sent to device physician.   [name], RN 10/15/2015 2:27 PM

## 2015-10-15 NOTE — Telephone Encounter (Signed)
Remote ICM transmission received.  Attempted patient call and left detailed message for return call.   

## 2015-10-25 ENCOUNTER — Ambulatory Visit (INDEPENDENT_AMBULATORY_CARE_PROVIDER_SITE_OTHER): Payer: BLUE CROSS/BLUE SHIELD | Admitting: Internal Medicine

## 2015-10-25 ENCOUNTER — Encounter (INDEPENDENT_AMBULATORY_CARE_PROVIDER_SITE_OTHER): Payer: Self-pay

## 2015-10-25 VITALS — BP 124/82 | HR 60 | Ht 64.0 in | Wt 159.0 lb

## 2015-10-25 DIAGNOSIS — I442 Atrioventricular block, complete: Secondary | ICD-10-CM | POA: Diagnosis not present

## 2015-10-25 DIAGNOSIS — I5022 Chronic systolic (congestive) heart failure: Secondary | ICD-10-CM | POA: Diagnosis not present

## 2015-10-25 DIAGNOSIS — Z95810 Presence of automatic (implantable) cardiac defibrillator: Secondary | ICD-10-CM

## 2015-10-25 DIAGNOSIS — I428 Other cardiomyopathies: Secondary | ICD-10-CM

## 2015-10-25 DIAGNOSIS — I429 Cardiomyopathy, unspecified: Secondary | ICD-10-CM | POA: Diagnosis not present

## 2015-10-25 MED ORDER — SACUBITRIL-VALSARTAN 49-51 MG PO TABS: 1.0000 | ORAL_TABLET | Freq: Two times a day (BID) | ORAL | 3 refills | Status: DC

## 2015-10-25 NOTE — Progress Notes (Signed)
Electrophysiology Office Note   Date:  10/25/2015   ID:  Joanne Bell, DOB 1960/12/24, MRN [id number]  PCP:  [name], MD  Cardiologist:    Primary Electrophysiologist: sk   No chief complaint on file.    History of Present Illness: Joanne Bell is a 55 y.o. female who presents today for electrophysiology  followup for a pacemaker implanted December 2012 for complete heart block and s/p CRT-D  upgrade  Her workup for underlying causes was negative including  MRI  echo Lyme titers and chest x-ray these were all normal. EF was normal by MRI 12/12   Notwithstanding the absence of symptoms, we undertook echocardiogram demonstrated ejection fraction 30-35% representating significant interval deterioration from 1/15 with EF of 45-50%.    She has significant snoring sleep disordered breathing and daytime somnolence. This is been getting worse.\  She has recently noted some dyspnea on exertion  This is perhaps somewhat improved following resynchronization. She has also had problems with her blood pressure being quite variable. She failed to tolerate clonidine with a sense of agitation. She isplaced on amlodipine with some recent improvement.  Past Medical History:  Diagnosis Date  . Atrioventricular block, complete (HCC)    narrow QRS  . Bilateral ovarian cysts   . 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 Chronic systolic CHF (congestive heart failure), NYHA class 3 ([place])   . Colon polyps   . Fibroids, intramural   . Hypertension   . NICM (nonischemic cardiomyopathy) ([place]) 07/20/2015   Past Surgical History:  Procedure Laterality Date  . COLONOSCOPY  2010   benign polyps, repeat in 5 yrs  . ENDOMETRIAL ABLATION  2004  . EP IMPLANTABLE DEVICE N/A 07/19/2015   Procedure: Upgrade to [place] ICD ;  Surgeon: Deboraha Sprang, MD;  Location: [facility];  Service: Cardiovascular;  Laterality: N/A;  . MYOMECTOMY  1998   per Dr.  Garwin Brothers  . PERMANENT PACEMAKER INSERTION Left 02/16/2011   Procedure: PERMANENT PACEMAKER INSERTION;  Surgeon: Deboraha Sprang, MD;  Location: [facility] CATH LAB;  Service: Cardiovascular;  Laterality: Left;     Current Outpatient Prescriptions  Medication Sig Dispense Refill  . amLODipine (NORVASC) 5 MG tablet Take 1 tablet (5 mg total) by mouth daily. Take at night 60 tablet 3  . carvedilol (COREG) 12.5 MG tablet Take 1 tablet (12.5 mg total) by mouth 2 (two) times daily with a meal. 60 tablet 5  . losartan (COZAAR) 100 MG tablet Take 1 tablet (100 mg total) by mouth daily. 90 tablet 3  . omeprazole (PRILOSEC) 40 MG capsule Take 40 mg by mouth daily as needed (for acid reflux or heartburn).   2  . vitamin E 100 UNIT capsule Take 100 Units by mouth daily as needed (for supplementation).      No current facility-administered medications for this visit.     Allergies:   Acyclovir and related and Percocet [oxycodone-acetaminophen]   Social History:  The patient  reports that she has never smoked. She has never used smokeless tobacco. She reports that she does not drink alcohol or use drugs.   Family History:  [name] family history includes Arthritis in her father; Depression in her mother; Diabetes in her father; Hyperlipidemia in her father; Hypertension in her father; Stroke in her father.    ROS:  Please see the history of present illness.   Otherwise, review of systems is positive for noen.   All other systems are reviewed  and negative.    PHYSICAL EXAM: VS:  BP 124/82   Pulse 60   Ht 5\' 4"  (1.626 m)   Wt 159 lb (72.1 kg)   BMI 27.29 kg/m  , BMI Body mass index is 27.29 kg/m. GEN: Well nourished, well developed, in no acute distress  HEENT: normal  Neck: supple \ Device pocket well healed; without hematoma or erythema.  There is no tethering  RRR without murmur MS: no deformity or atrophy  Skin: warm and dry,    Neuro:  Strength and sensation are intact Psych: euthymic  mood, full affect  EKG:  EKG is ordered today. The ekg ordered today shows P-synchronous/ AV  Pacing  With a biventricular pacing configuration     Device interrogation is reviewed today in detail.  See PaceArt for details.   Recent Labs: 07/12/2015: BUN 17; Creat 0.94; Hemoglobin 11.5; Platelets 315; Potassium 3.7; Sodium 142    Lipid Panel     Component Value Date/Time   CHOL 168 08/06/2010 1023   TRIG 160.0 (H) 08/06/2010 1023   HDL 47.20 08/06/2010 1023   CHOLHDL 4 08/06/2010 1023   VLDL 32.0 08/06/2010 1023   LDLCALC 89 08/06/2010 1023     Wt Readings from Last 3 Encounters:  10/25/15 159 lb (72.1 kg)  10/03/15 157 lb 6.4 oz (71.4 kg)  09/06/15 161 lb (73 kg)     Other studies Reviewed:     ASSESSMENT AND PLAN:  Complete heart block  Hypertension  sleep disordered breathing and daytime somnolence  Cardiomyopathy   CRT-D-Medtronic  [name] device was interrogated.  The information was reviewed.     There was a high pacing threshold in 3-4. The chest x-ray was reviewed. We will use poles 1 2. Significant improvement in longevity was estimated.   Blood pressure  Has been elevated. We will switch her losartan--Entresto   (49/51)with the goal subsequently to get her off of amlodipine and get on aldactone    Current medicines are reviewed at length with the patient today.   The patient is does not have concerns regarding her medicines.  The   changes were made As above       we will get an echocardiogram in about 3 months to look for improvement in LV systolic function    We spent more than 50% of our >25 min visit in face to face counseling regarding the above   Signed, [name], MD  10/25/2015 9:38 AM      [facility] [address] [place] [address] [phone] (office) [phone] (fax)

## 2015-10-25 NOTE — Patient Instructions (Addendum)
Medication Instructions: Your physician has recommended you make the following change in your medication:   1. STOP Losartan 2. START Entresto 49-51mg   - Take 1 tablet by mouth twice daily.  Labwork: None  Procedures/Testing: Your physician has requested that you have an echocardiogram. Echocardiography is a painless test that uses sound waves to create images of your heart. It provides your doctor with information about the size and shape of your heart and how well your heart's chambers and valves are working. This procedure takes approximately one hour. There are no restrictions for this procedure.    Follow-Up: Your physician recommends that you schedule a follow-up appointment in [place], [place] on the same day as your echocardiogram.   Any Additional Special Instructions Will Be Listed Below (If Applicable).     If you need a refill on your cardiac medications before your next appointment, please call your pharmacy.

## 2015-11-04 ENCOUNTER — Ambulatory Visit (INDEPENDENT_AMBULATORY_CARE_PROVIDER_SITE_OTHER): Payer: BLUE CROSS/BLUE SHIELD | Admitting: Family Medicine

## 2015-11-04 ENCOUNTER — Encounter: Payer: Self-pay | Admitting: Family Medicine

## 2015-11-04 VITALS — BP 131/65 | HR 50 | Temp 98.4°F | Ht 64.0 in | Wt 153.0 lb

## 2015-11-04 DIAGNOSIS — M5432 Sciatica, left side: Secondary | ICD-10-CM | POA: Diagnosis not present

## 2015-11-04 MED ORDER — METHYLPREDNISOLONE 4 MG PO TBPK: ORAL_TABLET | ORAL | 0 refills | Status: DC

## 2015-11-04 MED ORDER — TRAMADOL HCL 50 MG PO TABS: 50.0000 mg | ORAL_TABLET | Freq: Four times a day (QID) | ORAL | 0 refills | Status: DC | PRN

## 2015-11-04 NOTE — Progress Notes (Signed)
   Subjective:    Patient ID: Joanne Bell, female    DOB: [birth year], 55 y.o.   MRN: [id number]  HPI Here for 3 days of sharp pains in the left lower back that radiate down the back of the left buttock and thigh. No numbness or weakness. No recent trauma, but the pains started the morning after she had spent several hours on her hands and knees scrubbing some floors. She has tried heat, Tylenol, and Ibuprofen with mixed results.    Review of Systems  Constitutional: Negative.   Respiratory: Negative.   Cardiovascular: Negative.   Genitourinary: Negative.   Musculoskeletal: Positive for back pain.  Neurological: Negative.        Objective:   Physical Exam  Constitutional: She appears well-developed and well-nourished.  Musculoskeletal:  She is tender over the left sciatic notch but not over the spine. Her lumbar ROM is limited by pain and SLR on the left is positive.   Neurological: She is alert.          Assessment & Plan:  Sciatica. Given a Medrol dose pack and Tramadol to use. Recheck prn.  [name], MD

## 2015-11-04 NOTE — Progress Notes (Signed)
Pre visit review using our clinic review tool, if applicable. No additional management support is needed unless otherwise documented below in the visit note. 

## 2015-11-05 ENCOUNTER — Other Ambulatory Visit (HOSPITAL_COMMUNITY): Payer: BLUE CROSS/BLUE SHIELD

## 2015-11-08 ENCOUNTER — Encounter: Payer: Self-pay | Admitting: Internal Medicine

## 2015-11-19 ENCOUNTER — Other Ambulatory Visit: Payer: Self-pay | Admitting: *Deleted

## 2015-11-19 ENCOUNTER — Telehealth: Payer: Self-pay

## 2015-11-19 MED ORDER — SACUBITRIL-VALSARTAN 49-51 MG PO TABS: 1.0000 | ORAL_TABLET | Freq: Two times a day (BID) | ORAL | 3 refills | Status: DC

## 2015-11-19 NOTE — Telephone Encounter (Signed)
Prior auth for [employer] 49-51 submitted to [facility].

## 2015-11-20 ENCOUNTER — Telehealth: Payer: Self-pay | Admitting: *Deleted

## 2015-11-20 NOTE — Telephone Encounter (Signed)
Patient called and stated that the entresto needs prior authorization. I made her aware that this was submitted to [facility] yesterday. She stated that she has been without medication for a few days. I will place her a box of samples at the front desk. She was very Patent attorney.

## 2015-11-26 ENCOUNTER — Telehealth: Payer: Self-pay

## 2015-11-26 ENCOUNTER — Ambulatory Visit (INDEPENDENT_AMBULATORY_CARE_PROVIDER_SITE_OTHER): Payer: BLUE CROSS/BLUE SHIELD

## 2015-11-26 DIAGNOSIS — I5022 Chronic systolic (congestive) heart failure: Secondary | ICD-10-CM | POA: Diagnosis not present

## 2015-11-26 DIAGNOSIS — Z95810 Presence of automatic (implantable) cardiac defibrillator: Secondary | ICD-10-CM

## 2015-11-26 NOTE — Progress Notes (Signed)
EPIC Encounter for ICM Monitoring  Patient Name: RINAD CONAWAY is a 55 y.o. female Date: 11/26/2015 Primary Care Physican: [name], MD Primary Cardiologist: [name] Electrophysiologist: [name] Dry Weight:  unknown Bi-V Pacing:  97.3%       Attempted ICM call and unable to reach.  Transmission reviewed.   Thoracic impedance slightly abnormal suggesting fluid accumulation but trending close to baseline.  Recommendations:  None, unable to reach.  Not currently prescribed diuretic  Follow-up plan: ICM clinic phone appointment on 12/27/2015.  Copy of ICM check sent to device physician.   ICM trend: 11/26/2015       [name], RN 11/26/2015 9:11 AM

## 2015-11-26 NOTE — Telephone Encounter (Signed)
Remote ICM transmission received.  Attempted patient call and left detailed message and to return call.   

## 2015-11-28 ENCOUNTER — Ambulatory Visit (HOSPITAL_BASED_OUTPATIENT_CLINIC_OR_DEPARTMENT_OTHER): Payer: BLUE CROSS/BLUE SHIELD | Attending: Physician Assistant | Admitting: Cardiovascular Disease

## 2015-11-28 VITALS — Ht 64.0 in | Wt 155.0 lb

## 2015-11-28 DIAGNOSIS — G478 Other sleep disorders: Secondary | ICD-10-CM | POA: Diagnosis not present

## 2015-11-28 DIAGNOSIS — G4733 Obstructive sleep apnea (adult) (pediatric): Secondary | ICD-10-CM | POA: Diagnosis not present

## 2015-11-28 DIAGNOSIS — I10 Essential (primary) hypertension: Secondary | ICD-10-CM | POA: Diagnosis not present

## 2015-11-28 DIAGNOSIS — R0683 Snoring: Secondary | ICD-10-CM | POA: Diagnosis not present

## 2015-12-03 NOTE — Procedures (Signed)
Patient Name: Joanne Bell, Joanne Bell Date: 11/28/2015 Gender: Female D.O.B: 09/15/1960 Age (years): 64 Referring Provider: Almyra Deforest PA Height (inches): 64 Interpreting Physician: Shelva Majestic MD, ABSM Weight (lbs): 155 RPSGT: Gerhard Perches BMI: 27 MRN: [id number] Neck Size: 14.00  CLINICAL INFORMATION Sleep Study Type: NPSG Indication for sleep study: Snoring Epworth Sleepiness Score: 6  SLEEP STUDY TECHNIQUE As per the AASM Manual for the Scoring of Sleep and Associated Events v2.3 (April 2016) with a hypopnea requiring 4% desaturations. The channels recorded and monitored were frontal, central and occipital EEG, electrooculogram (EOG), submentalis EMG (chin), nasal and oral airflow, thoracic and abdominal wall motion, anterior tibialis EMG, snore microphone, electrocardiogram, and pulse oximetry.  MEDICATIONS carvedilol (COREG) 12.5 MG tablet methylPREDNISolone (MEDROL DOSEPAK) 4 MG TBPK tablet omeprazole (PRILOSEC) 40 MG capsule sacubitril-valsartan (ENTRESTO) 49-51 MG traMADol (ULTRAM) 50 MG tablet vitamin E 100 UNIT capsule  Medications self-administered by patient during sleep study : No sleep medicine administered.  SLEEP ARCHITECTURE The study was initiated at 10:45:05 PM and ended at 4:47:45 AM. Sleep onset time was 38.9 minutes and the sleep efficiency was 61.4%. The total sleep time was 222.8 minutes. Wake after sleep onset (WASO) was 101 minutes. Stage REM latency was 196.5 minutes. The patient spent 7.30% of the night in stage N1 sleep, 69.36% in stage N2 sleep, 0.00% in stage N3 and 23.34% in REM. Alpha intrusion was absent. Supine sleep was 22.34%.  RESPIRATORY PARAMETERS The overall apnea/hypopnea index (AHI) was 4.0 per hour. RDI was 11.0/h.  There were 1 total apneas, including 0 obstructive, 1 central and 0 mixed apneas. There were 14 hypopneas and 26 RERAs. The AHI during Stage REM sleep was 12.7 per hour. AHI while supine was 0.0 per  hour. The mean oxygen saturation was 93.46%. The minimum SpO2 during sleep was 86.00%. Soft snoring was noted during this study.  CARDIAC DATA The 2 lead EKG demonstrated sinus rhythm, pacemaker generated. The mean heart rate was 68.65 beats per minute. Other EKG findings include: None.  LEG MOVEMENT DATA The total PLMS were 1 with a resulting PLMS index of 0.27. Associated arousal with leg movement index was 0.3 .  IMPRESSIONS - Increased Upper Airway Resistance Syndrome (UARS) without definitive sleep apnea overall (AHI 4.0/h; RDI 11.0); however, there is mild sleep apnea during REM sleep (AHI 12.7/hr) - No significant central sleep apnea occurred during this study (CAI = 0.3/h). - Mild oxygen desaturation to a nadir of 86.00%. - Reduced sleep efficiency at only 61%.  - Abnormal sleep architecture with absence of slow wave sleep and prolonged latency to REM sleep. - The arousal index was abnormal.  - The patient snored with Soft snoring volume. - No cardiac abnormalities were noted during this study. - Clinically significant periodic limb movements did not occur during sleep. No significant associated arousals.  DIAGNOSIS - Increased Upper Airway Resistance Syndrome (UARS)  RECOMMENDATIONS - At present patient may not meet criteria for CPAP therapy; however, in this patient with significant cardiovascular co-morbidities  with an RDI 11/h, and AHI of 12.7/h if increasing symptoms develop would benefit from CPAP treatment.  - Efforts should be made to optimize nasal and oropharyngeal patency. - Can consider initial alternatives to CPAP such as customized oral appliance if applicable. - Avoid alcohol, sedatives and other CNS depressants that may worsen sleep apnea and disrupt normal sleep architecture. - Sleep hygiene should be reviewed to assess factors that may improve sleep quality and sleep efficiency. - Weight management and regular exercise  should be initiated or continued if  appropriate.   [Electronically signed] 12/03/2015 12:18 PM  Shelva Majestic MD, [facility],  [place], American Board of Sleep Medicine   NPI: [id number] PH: [phone]   FX: [phone] [place]

## 2015-12-09 ENCOUNTER — Other Ambulatory Visit: Payer: Self-pay | Admitting: Family Medicine

## 2015-12-09 NOTE — Telephone Encounter (Signed)
Refill request for Carvedilol 12.5 mg take 1 po bid and a 90 day supply to [facility] on [address].

## 2015-12-10 NOTE — Progress Notes (Signed)
Left message of results for pt  

## 2015-12-11 ENCOUNTER — Other Ambulatory Visit: Payer: Self-pay

## 2015-12-11 MED ORDER — CARVEDILOL 12.5 MG PO TABS: 12.5000 mg | ORAL_TABLET | Freq: Two times a day (BID) | ORAL | 0 refills | Status: DC

## 2015-12-11 NOTE — Telephone Encounter (Signed)
Script was sent in earlier today.

## 2015-12-27 ENCOUNTER — Telehealth: Payer: Self-pay

## 2015-12-27 ENCOUNTER — Ambulatory Visit (INDEPENDENT_AMBULATORY_CARE_PROVIDER_SITE_OTHER): Payer: BLUE CROSS/BLUE SHIELD

## 2015-12-27 DIAGNOSIS — Z95810 Presence of automatic (implantable) cardiac defibrillator: Secondary | ICD-10-CM | POA: Diagnosis not present

## 2015-12-27 DIAGNOSIS — I5022 Chronic systolic (congestive) heart failure: Secondary | ICD-10-CM | POA: Diagnosis not present

## 2015-12-27 NOTE — Telephone Encounter (Signed)
Remote ICM transmission received.  Attempted patient call and left detailed message regarding transmission and next ICM scheduled for 02/25/2016 and next office visit will be 01/24/2016.  Advised to return call for any fluid symptoms or questions.

## 2015-12-27 NOTE — Progress Notes (Signed)
EPIC Encounter for ICM Monitoring  Patient Name: [name] is a 55 y.o. female Date: 12/27/2015 Primary Care Physican: [name], MD Primary [place] Electrophysiologist: [name] Dry Weight:  unknown Bi-V Pacing:  99.3%       Attempted ICM call and unable to reach. Left detailed message regarding transmission.  Transmission reviewed.   Thoracic impedance normal   Follow-up plan: ICM clinic phone appointment on 02/25/2016 and office visit with Tommye Standard, PA on 01/24/2016.  Copy of ICM check sent to device physician.   ICM trend: 12/27/2015       [name], RN 12/27/2015 9:15 AM

## 2016-01-24 ENCOUNTER — Ambulatory Visit (INDEPENDENT_AMBULATORY_CARE_PROVIDER_SITE_OTHER): Payer: BLUE CROSS/BLUE SHIELD | Admitting: Physician Assistant

## 2016-01-24 ENCOUNTER — Ambulatory Visit (HOSPITAL_COMMUNITY): Payer: BLUE CROSS/BLUE SHIELD | Attending: Internal Medicine

## 2016-01-24 VITALS — BP 160/90 | HR 64 | Ht 64.0 in | Wt 160.0 lb

## 2016-01-24 DIAGNOSIS — Z95810 Presence of automatic (implantable) cardiac defibrillator: Secondary | ICD-10-CM | POA: Diagnosis not present

## 2016-01-24 DIAGNOSIS — I442 Atrioventricular block, complete: Secondary | ICD-10-CM

## 2016-01-24 DIAGNOSIS — I110 Hypertensive heart disease with heart failure: Secondary | ICD-10-CM | POA: Insufficient documentation

## 2016-01-24 DIAGNOSIS — I5022 Chronic systolic (congestive) heart failure: Secondary | ICD-10-CM | POA: Diagnosis not present

## 2016-01-24 DIAGNOSIS — I313 Pericardial effusion (noninflammatory): Secondary | ICD-10-CM | POA: Diagnosis not present

## 2016-01-24 DIAGNOSIS — R29898 Other symptoms and signs involving the musculoskeletal system: Secondary | ICD-10-CM | POA: Diagnosis not present

## 2016-01-24 DIAGNOSIS — I420 Dilated cardiomyopathy: Secondary | ICD-10-CM

## 2016-01-24 DIAGNOSIS — I428 Other cardiomyopathies: Secondary | ICD-10-CM | POA: Insufficient documentation

## 2016-01-24 DIAGNOSIS — I340 Nonrheumatic mitral (valve) insufficiency: Secondary | ICD-10-CM | POA: Insufficient documentation

## 2016-01-24 DIAGNOSIS — D649 Anemia, unspecified: Secondary | ICD-10-CM | POA: Diagnosis not present

## 2016-01-24 DIAGNOSIS — I10 Essential (primary) hypertension: Secondary | ICD-10-CM | POA: Diagnosis not present

## 2016-01-24 MED ORDER — SACUBITRIL-VALSARTAN 97-103 MG PO TABS: 1.0000 | ORAL_TABLET | Freq: Two times a day (BID) | ORAL | 6 refills | Status: DC

## 2016-01-24 NOTE — Progress Notes (Signed)
Cardiology Office Note Date:  01/24/2016  Patient ID:  Joanne, Bell [birth year], MRN [id number] PCP:  [name], MD  Cardiologist:  Dr. [name]   Chief Complaint: ICD check  History of Present Illness: Joanne Bell is a 55 y.o. female with history of NICM, w/CRT-D, chronic CHF, HTN, chronic CHF, and hx of CHB w/prior PPM, OSA, comes in today to be seen for Dr. [name], she was last seen in August, at that visit noted high BP losartan was changed to [facility] and mentions goal to get off amilodipine and on aldactone.  Planned for an echo and f/u in 24mo  She is feeling some stinging type discomfort at he ICD scar itself, noting some keloid formation.  Otherwise no pain. She is off the amlodipine already, has been since she saw Dr. [name].  She is following with L.Short, RN, and last evluation a month ago noted stable thoracic impedence.  Her weight is fairly stable, does not do daily weights but is going to a nutrition class for pre-DM and her last weight there was 157 last week and has been stable there for weeks.  Her BP is high today but only took her morning medicines when she got her < an hour ago.  She reports her home BP have been avg about 140's/80's, a low BP would be something on the 120's/80's.   She feels well, no SOB, exertional incapacities, no PND or orthopnea symptoms, no CP, palpitations, no near syncope or syncope.  She had the echo done today, no official reading yet, echo tech states appeared to him similar to her last.    Device information: MDT CRT-D, implanted 07/19/15, Dr. [name] DEVICE DEPENDENT   Past Medical History:  Diagnosis Date  . Atrioventricular block, complete (HCC)    narrow QRS  . Bilateral ovarian cysts   . 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 Chronic systolic CHF (congestive heart failure), NYHA class 3 ([place])   . Colon polyps   . Fibroids, intramural   . Hypertension   . NICM (nonischemic  cardiomyopathy) ([place]) 07/20/2015    Past Surgical History:  Procedure Laterality Date  . COLONOSCOPY  2010   benign polyps, repeat in 5 yrs  . ENDOMETRIAL ABLATION  2004  . EP IMPLANTABLE DEVICE N/A 07/19/2015   Procedure: Upgrade to [place] ICD ;  Surgeon: Deboraha Sprang, MD;  Location: [place] CV LAB;  Service: Cardiovascular;  Laterality: N/A;  . MYOMECTOMY  1998   per Dr. Garwin Brothers  . PERMANENT PACEMAKER INSERTION Left 02/16/2011   Procedure: PERMANENT PACEMAKER INSERTION;  Surgeon: Deboraha Sprang, MD;  Location: [facility] CATH LAB;  Service: Cardiovascular;  Laterality: Left;    Current Outpatient Prescriptions  Medication Sig Dispense Refill  . carvedilol (COREG) 12.5 MG tablet Take 1 tablet (12.5 mg total) by mouth 2 (two) times daily with a meal. 180 tablet 0  . omeprazole (PRILOSEC) 40 MG capsule Take 40 mg by mouth daily as needed (for acid reflux or heartburn).   2  . sacubitril-valsartan (ENTRESTO) 49-51 MG Take 1 tablet by mouth 2 (two) times daily. 60 tablet 3  . vitamin E 100 UNIT capsule Take 100 Units by mouth daily as needed (for supplementation).      No current facility-administered medications for this visit.     Allergies:   Acyclovir and related; Clonidine derivatives; and Percocet [oxycodone-acetaminophen]   Social History:  The patient  reports that she has never smoked. She has never  used smokeless tobacco. She reports that she does not drink alcohol or use drugs.   Family History:  [name] family history includes Arthritis in her father; Depression in her mother; Diabetes in her father; Hyperlipidemia in her father; Hypertension in her father; Stroke in her father.  ROS:  Please see the history of present illness.  All other systems are reviewed and otherwise negative.   PHYSICAL EXAM:  VS:  BP (!) 160/90   Pulse 64   Ht 5\' 4"  (1.626 m)   Wt 160 lb (72.6 kg)   BMI 27.46 kg/m  BMI: Body mass index is 27.46 kg/m. Well nourished, well developed, in no  acute distress  HEENT: normocephalic, atraumatic  Neck: no JVD, carotid bruits or masses Cardiac:  RRR no significant murmurs, no rubs, or gallops Lungs:  clear to auscultation bilaterally, no wheezing, rhonchi or rales  Abd: soft, nontender MS: no deformity or atrophy Ext: no edema  Skin: warm and dry, no rash Neuro:  No gross deficits appreciated Psych: euthymic mood, full affect  ICD site is stable, no tethering or discomfort, minimal keloid formation   EKG:  Done 10/25/15 SR V paced ICD interrogation today and reviewed by myself: normal function, battery and lead measurements are stable, no events or observations  04/24/15: TTE Study Conclusions - Left ventricle: The cavity size was normal. Wall thickness was   increased in a pattern of mild LVH. Systolic function was   moderately to severely reduced. The estimated ejection fraction   was in the range of 30% to 35%. Hypokinesis of the inferior   myocardium. Severe hypokinesis of the apical myocardium. Doppler   parameters are consistent with abnormal left ventricular   relaxation (grade 1 diastolic dysfunction).  Recent Labs: 07/12/2015: BUN 17; Creat 0.94; Hemoglobin 11.5; Platelets 315; Potassium 3.7; Sodium 142  No results found for requested labs within last 8760 hours.   CrCl cannot be calculated (Patient's most recent lab result is older than the maximum 21 days allowed.).   Wt Readings from Last 3 Encounters:  01/24/16 160 lb (72.6 kg)  11/28/15 155 lb (70.3 kg)  11/04/15 153 lb (69.4 kg)     Other studies reviewed: Additional studies/records reviewed today include: summarized above  ASSESSMENT AND PLAN:  1. NICM w/CRTD     normal device function, no changes made     euvolemic by exam and OptiVol, she weighs regularly at her nutrition class, will follow there and at home  2. CHB (prior PPM)     Now w/ICD  3. HTN     Improved by home readings  Will first up-titrate her Entresto to goal dose of 97/103mg   BID, she will continue with L. Short,RN w/ICM clinic monthly.  I will discuss with Margarita Grizzle, plans to add aldactone pending her response to the increased Entresto and eventually increasing her coreg as well.  WE will see her back in 36months, sooner if needed.  Current medicines are reviewed at length with the patient today.  The patient did not have any concerns regarding medicines.  Haywood Lasso, PA-C 01/24/2016 3:19 PM     [place] [id number] [phone] (office)  [phone] (fax)

## 2016-01-24 NOTE — Patient Instructions (Addendum)
Medication Instructions:   FINISH TAKING THE REMAINING ON HAND PILLS OF ENTRESTO 49/51 MG THEN ...  START TAKING NEW ORDER OF  ENTRESTO 97/103 MG TWICE A DAY    If you need a refill on your cardiac medications before your next appointment, please call your pharmacy.  Labwork: NONE ORDERED  TODAY    Testing/Procedures: NONE ORDERED  TODAY    Follow-Up:  IN 3 MONTHS  WITH RENEE URSUY OR KLEIN   CONTINUE MONTHLY ICM REMOTES WITH  LAURIE SHORT   Any Other Special Instructions Will Be Listed Below (If Applicable).

## 2016-02-25 ENCOUNTER — Ambulatory Visit (INDEPENDENT_AMBULATORY_CARE_PROVIDER_SITE_OTHER): Payer: BLUE CROSS/BLUE SHIELD

## 2016-02-25 ENCOUNTER — Telehealth: Payer: Self-pay

## 2016-02-25 DIAGNOSIS — Z95810 Presence of automatic (implantable) cardiac defibrillator: Secondary | ICD-10-CM

## 2016-02-25 DIAGNOSIS — I5022 Chronic systolic (congestive) heart failure: Secondary | ICD-10-CM

## 2016-02-25 NOTE — Telephone Encounter (Signed)
Remote ICM transmission received.  Attempted patient call and left message to return call.   

## 2016-02-25 NOTE — Progress Notes (Signed)
EPIC Encounter for ICM Monitoring  Patient Name: Joanne Bell is a 55 y.o. female Date: 02/25/2016 Primary Care Physican: [name], MD Primary [place] Electrophysiologist: Faustino Congress Weight:unknown Bi-V Pacing: 98%           Attempted ICM call and unable to reach.  Left message to return call.  Transmission reviewed.   Thoracic impedance normal   Recommendations: Unable to reach  Follow-up plan: ICM clinic phone appointment on 03/27/2016.  Copy of ICM check sent to device physician.   ICM trend: 02/25/2016       [name], RN 02/25/2016 8:23 AM

## 2016-02-27 NOTE — Progress Notes (Addendum)
Patient returned call.  She stated she is feeling good. She has been working with nutritionist to decrease sodium and sugar intake.  She is reading all her labels at the grocery store.  BP ranging in 130's-140/80-90 and yesterday was 134/89.    Patient has not started increased Entresto dosage of 93-107.  She continues to take Entresto 49-51 mg because the pills are expensive and she had a lot left of that dosage.  She stated she will start the increased med after she finishes 49-51 mg pills.   Reviewed transmission and advised is normal.  Next ICM remote transmission is 03/27/2016.   No changes today but advised would send copy ICM note to Tommye Standard, PA to provide update.

## 2016-03-03 ENCOUNTER — Telehealth: Payer: Self-pay

## 2016-03-03 NOTE — Telephone Encounter (Signed)
F/u Message ° °Pt returning RN call. Please call back to discuss  °

## 2016-03-06 NOTE — Telephone Encounter (Signed)
Returned patient call and left message that was returning her call.  Provided ICM direct number.

## 2016-03-16 ENCOUNTER — Encounter: Payer: Self-pay | Admitting: *Deleted

## 2016-03-18 ENCOUNTER — Ambulatory Visit (INDEPENDENT_AMBULATORY_CARE_PROVIDER_SITE_OTHER): Payer: BLUE CROSS/BLUE SHIELD | Admitting: Family Medicine

## 2016-03-18 ENCOUNTER — Encounter: Payer: Self-pay | Admitting: Family Medicine

## 2016-03-18 VITALS — BP 136/84 | HR 50 | Temp 98.2°F | Ht 64.0 in | Wt 159.0 lb

## 2016-03-18 DIAGNOSIS — J019 Acute sinusitis, unspecified: Secondary | ICD-10-CM

## 2016-03-18 MED ORDER — AMOXICILLIN-POT CLAVULANATE 875-125 MG PO TABS: 1.0000 | ORAL_TABLET | Freq: Two times a day (BID) | ORAL | 0 refills | Status: DC

## 2016-03-18 MED ORDER — METHYLPREDNISOLONE 4 MG PO TBPK: ORAL_TABLET | ORAL | 0 refills | Status: DC

## 2016-03-18 NOTE — Progress Notes (Signed)
   Subjective:    Patient ID: Joanne Bell, female    DOB: 07/05/1960, 56 y.o.   MRN: [id number]  HPI Here for 2 weeks of sinus pressure, PND, and blowing green mucus from the nose. No coughing or fever. She noticed redness in the right eye when she got up this am. No blurred vision or eye pain. Taking Mucinex 1200 mg bid.    Review of Systems  Constitutional: Negative.   HENT: Positive for congestion, postnasal drip, sinus pain and sinus pressure. Negative for ear pain, nosebleeds and sore throat.   Eyes: Positive for redness. Negative for photophobia, pain, discharge, itching and visual disturbance.  Respiratory: Negative.   Neurological: Negative.        Objective:   Physical Exam  Constitutional: She appears well-developed and well-nourished. No distress.  HENT:  Right Ear: External ear normal.  Left Ear: External ear normal.  Nose: Nose normal.  Mouth/Throat: Oropharynx is clear and moist.  Eyes:  Right conjunctiva is red over the lateral eye. Left is clear   Neck: Neck supple. No thyromegaly present.  Cardiovascular: Normal rate, regular rhythm, normal heart sounds and intact distal pulses.   Pulmonary/Chest: Effort normal and breath sounds normal.  Lymphadenopathy:    She has no cervical adenopathy.          Assessment & Plan:  Sinusitis, treat with Augmentin and a Medrol dose pack. I reassured her the subconjunctival hemorrhage will resolve over the next few days.  Alysia Penna, MD

## 2016-03-18 NOTE — Progress Notes (Signed)
Pre visit review using our clinic review tool, if applicable. No additional management support is needed unless otherwise documented below in the visit note. 

## 2016-03-23 ENCOUNTER — Other Ambulatory Visit: Payer: Self-pay | Admitting: Family Medicine

## 2016-03-24 ENCOUNTER — Telehealth: Payer: Self-pay | Admitting: Internal Medicine

## 2016-03-24 DIAGNOSIS — I428 Other cardiomyopathies: Secondary | ICD-10-CM

## 2016-03-24 NOTE — Telephone Encounter (Signed)
Joanne Bell is asking that you give her a call in regards to a letter she received about a test . Please call

## 2016-03-24 NOTE — Telephone Encounter (Signed)
Can we refill this? 

## 2016-03-27 ENCOUNTER — Ambulatory Visit (INDEPENDENT_AMBULATORY_CARE_PROVIDER_SITE_OTHER): Payer: BLUE CROSS/BLUE SHIELD

## 2016-03-27 DIAGNOSIS — Z95810 Presence of automatic (implantable) cardiac defibrillator: Secondary | ICD-10-CM

## 2016-03-27 DIAGNOSIS — I5022 Chronic systolic (congestive) heart failure: Secondary | ICD-10-CM

## 2016-03-27 NOTE — Telephone Encounter (Signed)
I left a message for the patient to call. 

## 2016-03-27 NOTE — Telephone Encounter (Signed)
I spoke with the patient. She is aware of Dr. [name] recommendations:  Notes Recorded by Deboraha Sprang, MD on 01/26/2016 at 12:22 PM EST Please Inform Patient Echo showed  stable heart muscle function; Unfortunately we did not see improvement  We should get AV op echo A nd FU with AS/RU for drug uptitration  She is agreeable with pursuing the AV opt echo. She already has follow up on 04/11/16 with Tommye Standard, PA. She is aware that I will order her echo and scheduling will call her to arrange.

## 2016-03-30 NOTE — Progress Notes (Signed)
EPIC Encounter for ICM Monitoring  Patient Name: Joanne Bell is a 56 y.o. female Date: 03/30/2016 Primary Care Physican: Alysia Penna, MD Primary [place] Electrophysiologist: Faustino Congress Weight:unknown Bi-V Pacing: 98.4%         Attempted ICM call and unable to reach.  Left *detailed message regarding transmission.  Transmission reviewed.   Thoracic impedance normal   Recommendations: Provided ICM number and encouraged to call for fluid symptoms.  Follow-up plan: ICM clinic phone appointment on 05/22/2016.  Office appointment with Tommye Standard, PA for defib check 02/18/2017  Copy of ICM check sent to device physician.   3 month ICM trend: 03/27/2016   1 Year ICM trend:      [name], RN 03/30/2016 11:20 AM

## 2016-04-03 ENCOUNTER — Encounter: Payer: Self-pay | Admitting: Adult Health

## 2016-04-03 ENCOUNTER — Other Ambulatory Visit: Payer: Self-pay | Admitting: Adult Health

## 2016-04-03 ENCOUNTER — Telehealth: Payer: Self-pay

## 2016-04-03 ENCOUNTER — Ambulatory Visit (INDEPENDENT_AMBULATORY_CARE_PROVIDER_SITE_OTHER): Payer: BLUE CROSS/BLUE SHIELD | Admitting: Adult Health

## 2016-04-03 ENCOUNTER — Ambulatory Visit (INDEPENDENT_AMBULATORY_CARE_PROVIDER_SITE_OTHER)
Admission: RE | Admit: 2016-04-03 | Discharge: 2016-04-03 | Disposition: A | Discharge status: Home / Self Care | Payer: BLUE CROSS/BLUE SHIELD | Source: Ambulatory Visit | Attending: Adult Health | Admitting: Adult Health

## 2016-04-03 VITALS — BP 130/88 | HR 88 | Temp 91.0°F | Ht 64.0 in | Wt 157.7 lb

## 2016-04-03 DIAGNOSIS — R6889 Other general symptoms and signs: Secondary | ICD-10-CM | POA: Diagnosis not present

## 2016-04-03 DIAGNOSIS — R509 Fever, unspecified: Secondary | ICD-10-CM | POA: Diagnosis not present

## 2016-04-03 LAB — POCT INFLUENZA A/B
INFLUENZA A, POC: NEGATIVE
INFLUENZA B, POC: NEGATIVE

## 2016-04-03 MED ORDER — OSELTAMIVIR PHOSPHATE 75 MG PO CAPS: 75.0000 mg | ORAL_CAPSULE | Freq: Two times a day (BID) | ORAL | 0 refills | Status: AC

## 2016-04-03 MED ORDER — PREDNISONE 10 MG PO TABS: ORAL_TABLET | ORAL | 0 refills | Status: DC

## 2016-04-03 MED ORDER — IPRATROPIUM-ALBUTEROL 0.5-2.5 (3) MG/3ML IN SOLN: 3.0000 mL | Freq: Once | RESPIRATORY_TRACT | Status: DC

## 2016-04-03 MED ORDER — HYDROCODONE-HOMATROPINE 5-1.5 MG/5ML PO SYRP: 5.0000 mL | ORAL_SOLUTION | Freq: Three times a day (TID) | ORAL | 0 refills | Status: DC | PRN

## 2016-04-03 NOTE — Telephone Encounter (Signed)
Returned patient call as requested on voice mail.  She reported flu like symptoms and was unsure what physician to call.  Advised to call PCP or urgent care if needed.  She will call this am.  She denied any fluid symptoms.

## 2016-04-03 NOTE — Progress Notes (Signed)
Subjective:    Patient ID: Joanne Bell, female    DOB: 17-Aug-1960, 56 y.o.   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 She is a patient of Dr. Sarajane Jews who I am seeing today for the first time. She presents to the office today for an acute issue.   She reports that over the last 24 hours she has been experiencing low grade fevers, productive constant cough, vomiting ( from cough), and headaches.   Her husband was diagnosed with the flu?   Review of Systems  Constitutional: Positive for activity change, appetite change, chills, diaphoresis, fatigue and fever.  HENT: Positive for congestion. Negative for sinus pain, sinus pressure and sore throat.   Eyes: Negative.   Respiratory: Positive for cough, shortness of breath and wheezing.   Cardiovascular: Negative.   Gastrointestinal: Positive for vomiting. Negative for abdominal pain, diarrhea and nausea.  Neurological: Positive for headaches. Negative for dizziness and weakness.  All other systems reviewed and are negative.  Past Medical History:  Diagnosis Date  . Atrioventricular block, complete (HCC)    narrow QRS  . Bilateral ovarian cysts   . 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 Chronic systolic CHF (congestive heart failure), NYHA class 3 ([place])   . Colon polyps   . Fibroids, intramural   . Hypertension   . NICM (nonischemic cardiomyopathy) ([place]) 07/20/2015    Social History   Social History  . Marital status: Married    Spouse name: N/A  . Number of children: 2  . Years of education: N/A   Occupational History  .  [employer]   Secretary   Social History Main Topics  . Smoking status: Never Smoker  . Smokeless tobacco: Never Used  . Alcohol use No  . Drug use: No  . Sexual activity: Not on file   Other Topics Concern  . Not on file   Social History Narrative  . No narrative on file    Past Surgical History:  Procedure Laterality Date  . COLONOSCOPY  2010   benign polyps, repeat in 5 yrs  . ENDOMETRIAL ABLATION  2004  . EP IMPLANTABLE DEVICE N/A 07/19/2015   Procedure: Upgrade to [place] ICD ;  Surgeon: Deboraha Sprang, MD;  Location: [place] CV LAB;  Service: Cardiovascular;  Laterality: N/A;  . MYOMECTOMY  1998   per Dr. Garwin Brothers  . PERMANENT PACEMAKER INSERTION Left 02/16/2011   Procedure: PERMANENT PACEMAKER INSERTION;  Surgeon: Deboraha Sprang, MD;  Location: [facility] CATH LAB;  Service: Cardiovascular;  Laterality: Left;    Family History  Problem Relation Age of Onset  . Depression Mother   . Arthritis Father   . Diabetes Father   . Hyperlipidemia Father   . Hypertension Father   . Stroke Father     Allergies  Allergen Reactions  . Acyclovir And Related Other (See Comments)    Reaction unknown to patient  . Clonidine Derivatives Other (See Comments)    Pt states "it makes me irritable"  . Percocet [Oxycodone-Acetaminophen] Other (See Comments)    Dizziness, sweating, abdominal discomfort    Current Outpatient Prescriptions on File Prior to Visit  Medication Sig Dispense Refill  . amoxicillin-clavulanate (AUGMENTIN) 875-125 MG tablet Take 1  tablet by mouth 2 (two) times daily. 20 tablet 0  . carvedilol (COREG) 12.5 MG tablet Take 1 tablet (12.5 mg total) by mouth 2 (two) times daily with a meal. 180 tablet 0  . omeprazole (PRILOSEC) 40 MG capsule Take 40 mg by mouth daily as needed (for acid reflux or heartburn).   2  . omeprazole (PRILOSEC) 40 MG capsule TAKE 1 CAPSULE (40 MG TOTAL) BY MOUTH DAILY. 90 capsule 3  . sacubitril-valsartan (ENTRESTO) 97-103 MG Take 1 tablet by mouth 2 (two)  times daily. 60 tablet 6  . vitamin E 100 UNIT capsule Take 100 Units by mouth daily as needed (for supplementation).      No current facility-administered medications on file prior to visit.     BP 130/88   Pulse 88   Temp (!) 91 F (32.8 C) (Oral)   Ht 5\' 4"  (1.626 m)   Wt 157 lb 11.2 oz (71.5 kg)   SpO2 91%   BMI 27.07 kg/m       Objective:   Physical Exam  Constitutional: She appears well-developed and well-nourished. No distress.  HENT:  Head: Normocephalic and atraumatic.  Right Ear: External ear normal.  Left Ear: External ear normal.  Nose: Nose normal.  Mouth/Throat: Oropharynx is clear and moist. No oropharyngeal exudate.  Neck: Normal range of motion. No thyromegaly present.  Cardiovascular: Normal rate, regular rhythm, normal heart sounds and intact distal pulses.  Exam reveals no gallop and no friction rub.   No murmur heard. Pulmonary/Chest: Effort normal and breath sounds normal. No respiratory distress. She has no rales. She exhibits no tenderness.  Abdominal: Soft. Bowel sounds are normal. She exhibits no distension and no mass. There is no tenderness. There is no rebound and no guarding.  Lymphadenopathy:    She has no cervical adenopathy.  Skin: Skin is warm and dry. No rash noted. She is not diaphoretic. No erythema. No pallor.  Psychiatric: She has a normal mood and affect. Her behavior is normal. Judgment and thought content normal.  Nursing note and vitals reviewed.     Assessment & Plan:  1. Flu-like symptoms - ipratropium-albuterol (DUONEB) 0.5-2.5 (3) MG/3ML nebulizer solution 3 mL; Take 3 mLs by nebulization once. - POC Influenza A/B- Negative  - DG Chest 2 View; Future - HYDROcodone-homatropine (HYCODAN) 5-1.5 MG/5ML syrup; Take 5 mLs by mouth every 8 (eight) hours as needed for cough.  Dispense: 120 mL; Refill: 0 - Will send for chest x ray and treat as indicated once resulted - Consider ABX and/or tamiflu  * Patient did not endorse feeling  as though she was breathing any easier after duoneb. Oxygen saturations up from 91 to 95%. Continues to sound tight. No wheezing, rhonchi or rales  Dorothyann Peng, NP

## 2016-04-07 ENCOUNTER — Telehealth: Payer: Self-pay | Admitting: Family Medicine

## 2016-04-07 NOTE — Telephone Encounter (Signed)
Ok to write work note? 

## 2016-04-07 NOTE — Telephone Encounter (Signed)
Pt states she came in Friday and saw Joanne Bell.  She states that Tommi Rumps called to notify her that she tested positive for Pneumonia.  She is asking if a note can be written for her to pick-up excusing her from work from Friday, Monday and today.

## 2016-04-07 NOTE — Telephone Encounter (Signed)
Pt would like to pick up note today. Please advise.

## 2016-04-07 NOTE — Telephone Encounter (Signed)
Patient notified and letter for work printed and placed up front.

## 2016-04-07 NOTE — Telephone Encounter (Signed)
She was treated for flu not pneumonia. Ok to write note

## 2016-04-09 ENCOUNTER — Other Ambulatory Visit: Payer: Self-pay | Admitting: Family Medicine

## 2016-04-13 NOTE — Telephone Encounter (Signed)
Can we refill this? 

## 2016-04-14 ENCOUNTER — Other Ambulatory Visit: Payer: Self-pay

## 2016-04-14 ENCOUNTER — Ambulatory Visit (HOSPITAL_COMMUNITY): Payer: BLUE CROSS/BLUE SHIELD | Attending: Cardiology

## 2016-04-14 DIAGNOSIS — I313 Pericardial effusion (noninflammatory): Secondary | ICD-10-CM | POA: Insufficient documentation

## 2016-04-14 DIAGNOSIS — I10 Essential (primary) hypertension: Secondary | ICD-10-CM | POA: Insufficient documentation

## 2016-04-14 DIAGNOSIS — I428 Other cardiomyopathies: Secondary | ICD-10-CM

## 2016-04-21 ENCOUNTER — Ambulatory Visit (INDEPENDENT_AMBULATORY_CARE_PROVIDER_SITE_OTHER): Payer: BLUE CROSS/BLUE SHIELD | Admitting: Physician Assistant

## 2016-04-21 VITALS — BP 146/98 | HR 82 | Ht 64.0 in | Wt 161.0 lb

## 2016-04-21 DIAGNOSIS — I442 Atrioventricular block, complete: Secondary | ICD-10-CM

## 2016-04-21 DIAGNOSIS — Z95810 Presence of automatic (implantable) cardiac defibrillator: Secondary | ICD-10-CM | POA: Diagnosis not present

## 2016-04-21 DIAGNOSIS — I10 Essential (primary) hypertension: Secondary | ICD-10-CM | POA: Diagnosis not present

## 2016-04-21 NOTE — Progress Notes (Signed)
Cardiology Office Note Date:  04/21/2016  Patient ID:  Joanne, Bell 1960/05/28, MRN [id number] PCP:  Alysia Penna, MD  Cardiologist:  Dr. [name]   Chief Complaint: planned f/u, titrate meds if possible  History of Present Illness: Joanne Bell is a 56 y.o. female with history of NICM, w/CRT-D, chronic CHF, HTN, chronic CHF, and hx of CHB w/prior PPM, OSA, comes in today to be seen for Dr. [name], she was last seen by him in August 2017, at that visit noted high BP losartan was changed to [facility] and mentions goal to add on aldactone.    She reports feeling worse after AV optimization, less exertional capacity and more SOB.  No CP, palpitations, no dizziness, near syncope or syncope.  She has not gone to the 97-103mg  dose of Entresto yet, having just refilled her Rx prior to her last visit and did not want to waste that.   While here, her device was reprogrammed to her prior settings and she states she could immediately feel like she was breathing easier.   Device information:  MDT CRT-D, implanted 07/19/15, Dr. [name] DEVICE DEPENDENT   Past Medical History:  Diagnosis Date  . Atrioventricular block, complete (HCC)    narrow QRS  . Bilateral ovarian cysts   . 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 Chronic systolic CHF (congestive heart failure), NYHA class 3 ([place])   . Colon polyps   . Fibroids, intramural   . Hypertension   . NICM (nonischemic cardiomyopathy) ([place]) 07/20/2015    Past Surgical History:  Procedure Laterality Date  . COLONOSCOPY  2010   benign polyps, repeat in 5 yrs  . ENDOMETRIAL ABLATION  2004  . EP IMPLANTABLE DEVICE N/A 07/19/2015   Procedure: Upgrade to [place] ICD ;  Surgeon: Deboraha Sprang, MD;  Location: [facility] CV [facility];  Service: Cardiovascular;  Laterality: N/A;  . MYOMECTOMY  1998   per Dr. Garwin Brothers  . PERMANENT PACEMAKER INSERTION Left 02/16/2011   Procedure: PERMANENT PACEMAKER INSERTION;   Surgeon: Deboraha Sprang, MD;  Location: [facility] CATH LAB;  Service: Cardiovascular;  Laterality: Left;    Current Outpatient Prescriptions  Medication Sig Dispense Refill  . amoxicillin-clavulanate (AUGMENTIN) 875-125 MG tablet Take 1 tablet by mouth 2 (two) times daily. 20 tablet 0  . carvedilol (COREG) 12.5 MG tablet TAKE 1 TABLET (12.5 MG TOTAL) BY MOUTH 2 (TWO) TIMES DAILY WITH A MEAL. 180 tablet 1  . HYDROcodone-homatropine (HYCODAN) 5-1.5 MG/5ML syrup Take 5 mLs by mouth every 8 (eight) hours as needed for cough. 120 mL 0  . omeprazole (PRILOSEC) 40 MG capsule Take 40 mg by mouth daily as needed (for acid reflux or heartburn).   2  . omeprazole (PRILOSEC) 40 MG capsule TAKE 1 CAPSULE (40 MG TOTAL) BY MOUTH DAILY. 90 capsule 3  . predniSONE (DELTASONE) 10 MG tablet 40 mg x 3 days, 20 mg x 3 days, 10 mg x 3 days 21 tablet 0  . sacubitril-valsartan (ENTRESTO) 97-103 MG Take 1 tablet by mouth 2 (two) times daily. 60 tablet 6  . vitamin E 100 UNIT capsule Take 100 Units by mouth daily as needed (for supplementation).      Current Facility-Administered Medications  Medication Dose Route Frequency Provider Last Rate Last Dose  . ipratropium-albuterol (DUONEB) 0.5-2.5 (3) MG/3ML nebulizer solution 3 mL  3 mL Nebulization Once Dorothyann Peng, NP        Allergies:   Acyclovir and related; Clonidine derivatives; and Percocet [  oxycodone-acetaminophen]   Social History:  The patient  reports that she has never smoked. She has never used smokeless tobacco. She reports that she does not drink alcohol or use drugs.   Family History:  [name] family history includes Arthritis in her father; Depression in her mother; Diabetes in her father; Hyperlipidemia in her father; Hypertension in her father; Stroke in her father.  ROS:  Please see the history of present illness.  All other systems are reviewed and otherwise negative.   PHYSICAL EXAM:  VS:  There were no vitals taken for this visit. BMI: There  is no height or weight on file to calculate BMI. Well nourished, well developed, in no acute distress  HEENT: normocephalic, atraumatic  Neck: no JVD, carotid bruits or masses Cardiac:  RRR no significant murmurs, no rubs, or gallops Lungs:  clear to auscultation bilaterally, no wheezing, rhonchi or rales  Abd: soft, nontender MS: no deformity or atrophy Ext: no edema  Skin: warm and dry, no rash Neuro:  No gross deficits appreciated Psych: euthymic mood, full affect  ICD site is stable, no tethering or discomfort, minimal keloid formation   EKG:  Done 10/25/15 SR V paced ICD interrogation today and reviewed by myself: normal function, battery and lead measurements are stable, no events or observations, 100% BiVe paced  04/14/16: TTE for AV optimization Study Conclusions - Left ventricle: The cavity size was normal. Wall thickness was   normal. Systolic function was mildly reduced. The estimated   ejection fraction was in the range of 45% to 50%. Diffuse   hypokinesis. Doppler parameters are consistent with abnormal left   ventricular relaxation (grade 1 diastolic dysfunction). - Mitral valve: There was no significant regurgitation. - Right ventricle: The cavity size was normal. Pacer wire or   catheter noted in right ventricle. Systolic function was normal. - Tricuspid valve: Peak RV-RA gradient (S): 23 mm Hg. - Pulmonary arteries: PA peak pressure: 26 mm Hg (S). - Inferior vena cava: The vessel was normal in size. The   respirophasic diameter changes were in the normal range (>= 50%),   consistent with normal central venous pressure. - Pericardium, extracardiac: A trivial pericardial effusion was   identified posterior to the heart. Impressions: - Limited echo for AV optimization.  01/24/16: TTE EF 30-35%  04/24/15: TTE The estimated ejection fraction was in the range of 30% to 35%. Hypokinesis of the inferior   myocardium. Severe hypokinesis of the apical myocardium.  Doppler   parameters are consistent with abnormal left ventricular   relaxation (grade 1 diastolic dysfunction).  Recent Labs: 07/12/2015: BUN 17; Creat 0.94; Hemoglobin 11.5; Platelets 315; Potassium 3.7; Sodium 142  No results found for requested labs within last 8760 hours.   CrCl cannot be calculated (Patient's most recent lab result is older than the maximum 21 days allowed.).   Wt Readings from Last 3 Encounters:  04/03/16 157 lb 11.2 oz (71.5 kg)  03/18/16 159 lb (72.1 kg)  01/24/16 160 lb (72.6 kg)     Other studies reviewed: Additional studies/records reviewed today include: summarized above  ASSESSMENT AND PLAN:  1. NICM w/CRTD     normal device function, reprogrammed to pre-AV optimization settings     euvolemic by exam and OptiVol     She will be starting the next dose of Entresto next week     She has had some improvement in her EF  Will see her back in 23months, will plan for Aldactone.  2. CHB (prior  PPM)     Now w/ICD  3. HTN     Stable, should allow for her up-titration of the Entresto    Current medicines are reviewed at length with the patient today.  The patient did not have any concerns regarding medicines.  Haywood Lasso, PA-C 04/21/2016 6:15 AM     [facility] [address] [place] [address] [phone] (office)  [phone] (fax)

## 2016-04-21 NOTE — Patient Instructions (Addendum)
Medication Instructions:   Your physician recommends that you continue on your current medications as directed. Please refer to the Current Medication list given to you today.  If you need a refill on your cardiac medications before your next appointment, please call your pharmacy.  Labwork: NONE ORDERED  TODAY    Testing/Procedures: NONE ORDERED  TODAY    Follow-Up: IN 3 MONTHS WITH RENEE URSUY   Any Other Special Instructions Will Be Listed Below (If Applicable).                                                                                                                                                   

## 2016-04-27 ENCOUNTER — Other Ambulatory Visit: Payer: Self-pay | Admitting: Internal Medicine

## 2016-05-03 ENCOUNTER — Other Ambulatory Visit: Payer: Self-pay | Admitting: Internal Medicine

## 2016-05-20 ENCOUNTER — Other Ambulatory Visit: Payer: Self-pay | Admitting: Internal Medicine

## 2016-05-22 ENCOUNTER — Ambulatory Visit (INDEPENDENT_AMBULATORY_CARE_PROVIDER_SITE_OTHER): Payer: BLUE CROSS/BLUE SHIELD

## 2016-05-22 DIAGNOSIS — I5022 Chronic systolic (congestive) heart failure: Secondary | ICD-10-CM | POA: Diagnosis not present

## 2016-05-22 DIAGNOSIS — Z95810 Presence of automatic (implantable) cardiac defibrillator: Secondary | ICD-10-CM | POA: Diagnosis not present

## 2016-05-22 NOTE — Progress Notes (Signed)
EPIC Encounter for ICM Monitoring  Patient Name: Joanne Bell is a 56 y.o. female Date: 05/22/2016 Primary Care Physican: Alysia Penna, MD Primary [place] Electrophysiologist: Faustino Congress Weight:unknown Bi-V Pacing:  99.6%       Transmission reviewed   Thoracic impedance normal.  No diuretic  Recommendations: None  Follow-up plan: ICM clinic phone appointment on 06/25/2016.  Copy of ICM check sent to device physician.   3 month ICM trend: 05/22/2016   1 Year ICM trend:      [name], RN 05/22/2016 8:12 AM

## 2016-06-23 ENCOUNTER — Telehealth: Payer: Self-pay

## 2016-06-23 NOTE — Telephone Encounter (Signed)
Patient returned call.  She said earlier this morning she was having some shortness of breath and feeling full in chest but this afternoon she is feeling much better.   She is ok right now.  Advised ICM transmission scheduled for 06/25/2016 and she said that would be fine since she is feeling fine now.

## 2016-06-23 NOTE — Telephone Encounter (Signed)
Returned call to patient as requested by voice mail message.  Left message for return call.

## 2016-06-25 ENCOUNTER — Telehealth: Payer: Self-pay

## 2016-06-25 ENCOUNTER — Ambulatory Visit (INDEPENDENT_AMBULATORY_CARE_PROVIDER_SITE_OTHER): Payer: BLUE CROSS/BLUE SHIELD

## 2016-06-25 DIAGNOSIS — Z95810 Presence of automatic (implantable) cardiac defibrillator: Secondary | ICD-10-CM | POA: Diagnosis not present

## 2016-06-25 DIAGNOSIS — I5022 Chronic systolic (congestive) heart failure: Secondary | ICD-10-CM | POA: Diagnosis not present

## 2016-06-25 NOTE — Progress Notes (Signed)
EPIC Encounter for ICM Monitoring  Patient Name: Joanne Bell is a 56 y.o. female Date: 06/25/2016 Primary Care Physican: Alysia Penna, MD Primary [place] Electrophysiologist: Faustino Congress Weight:unknown Bi-V Pacing:  98.9%         Attempted call to patient and unable to reach.  Left message to return call regarding transmission.  Transmission reviewed.   Patient called on 06/23/2016 and reported some shortness of breath early in the day but was feeling better by late afternoon     Thoracic impedance normal.  No diuretic  Recommendations: NONE - Unable to reach patient   Follow-up plan: ICM clinic phone appointment on 07/28/2016.   Copy of ICM check sent to device physician.   3 month ICM trend: 06/25/2016   1 Year ICM trend:      [name], RN 06/25/2016 9:35 AM

## 2016-06-25 NOTE — Telephone Encounter (Signed)
Remote ICM transmission received.  Attempted patient call and left message to return call.   
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
## 2016-07-22 ENCOUNTER — Telehealth: Payer: Self-pay | Admitting: Internal Medicine

## 2016-07-22 NOTE — Telephone Encounter (Signed)
Walk in pt Form-Medical Release for Exercise-dropped off placed in [employer].

## 2016-07-28 ENCOUNTER — Ambulatory Visit (INDEPENDENT_AMBULATORY_CARE_PROVIDER_SITE_OTHER): Payer: BLUE CROSS/BLUE SHIELD

## 2016-07-28 ENCOUNTER — Telehealth: Payer: Self-pay

## 2016-07-28 DIAGNOSIS — I5022 Chronic systolic (congestive) heart failure: Secondary | ICD-10-CM

## 2016-07-28 DIAGNOSIS — Z95810 Presence of automatic (implantable) cardiac defibrillator: Secondary | ICD-10-CM

## 2016-07-28 NOTE — Progress Notes (Signed)
EPIC Encounter for ICM Monitoring  Patient Name: Joanne Bell is a 56 y.o. female Date: 07/28/2016 Primary Care Physican: [name], MD Primary [place] Electrophysiologist: Faustino Congress Weight:unknown Bi-V Pacing: 99.4%         Attempted call to patient and unable to reach.  Left detailed message regarding transmission.  Transmission reviewed.    Thoracic impedance normal.  No diuretic  Recommendations: Left voice mail with ICM number and encouraged to call for fluid symptoms.  Follow-up plan: ICM clinic phone appointment on 08/28/2016.    Copy of ICM check sent to device physician.   3 month ICM trend: 07/28/2016   1 Year ICM trend:      [name], RN 07/28/2016 3:13 PM

## 2016-07-28 NOTE — Telephone Encounter (Signed)
Remote ICM transmission received.  Attempted patient call and left detailed message regarding transmission and next ICM scheduled for 08/28/2016.  Advised to return call for any fluid symptoms or questions.

## 2016-08-04 ENCOUNTER — Telehealth: Payer: Self-pay | Admitting: Internal Medicine

## 2016-08-04 ENCOUNTER — Encounter: Payer: Self-pay | Admitting: Family Medicine

## 2016-08-04 ENCOUNTER — Ambulatory Visit (INDEPENDENT_AMBULATORY_CARE_PROVIDER_SITE_OTHER): Payer: BLUE CROSS/BLUE SHIELD | Admitting: Family Medicine

## 2016-08-04 VITALS — BP 150/98 | HR 80 | Temp 99.9°F | Ht 64.0 in | Wt 155.0 lb

## 2016-08-04 DIAGNOSIS — J018 Other acute sinusitis: Secondary | ICD-10-CM | POA: Diagnosis not present

## 2016-08-04 MED ORDER — AMOXICILLIN-POT CLAVULANATE 875-125 MG PO TABS: 1.0000 | ORAL_TABLET | Freq: Two times a day (BID) | ORAL | 0 refills | Status: DC

## 2016-08-04 NOTE — Telephone Encounter (Signed)
"  Medical Release for Client to Exercise" form dropped off by the patient for Dr. [name] to complete. Form completed. I left a message for the patient to call.  This has been placed at the front desk for her.

## 2016-08-04 NOTE — Progress Notes (Signed)
   Subjective:    Patient ID: Joanne Bell, female    DOB: 1960/06/29, 56 y.o.   MRN: [id number]  HPI Here for one week of sinus pressure, PND, and a dry cough. Using Hydromet syrup.    Review of Systems  Constitutional: Negative.   HENT: Positive for congestion, postnasal drip, sinus pain and sinus pressure. Negative for sore throat.   Eyes: Negative.   Respiratory: Positive for cough.        Objective:   Physical Exam  Constitutional: She appears well-developed and well-nourished.  HENT:  Right Ear: External ear normal.  Left Ear: External ear normal.  Nose: Nose normal.  Mouth/Throat: Oropharynx is clear and moist.  Eyes: Conjunctivae are normal.  Neck: No thyromegaly present.  Pulmonary/Chest: Effort normal and breath sounds normal.  Lymphadenopathy:    She has no cervical adenopathy.          Assessment & Plan:  Sinusitis, treat with Augmentin. Alysia Penna, MD

## 2016-08-04 NOTE — Patient Instructions (Signed)
WE NOW OFFER    [facility] FAST TRACK!!!  SAME DAY Appointments for ACUTE CARE  Such as: Sprains, Injuries, cuts, abrasions, rashes, muscle pain, joint pain, back pain Colds, flu, sore throats, headache, allergies, cough, fever  Ear pain, sinus and eye infections Abdominal pain, nausea, vomiting, diarrhea, upset stomach Animal/insect bites  3 Easy Ways to Schedule: Walk-In Scheduling Call in scheduling Mychart Sign-up: [email or web address]         

## 2016-08-05 ENCOUNTER — Telehealth: Payer: Self-pay | Admitting: Family Medicine

## 2016-08-05 MED ORDER — CEFUROXIME AXETIL 500 MG PO TABS: 500.0000 mg | ORAL_TABLET | Freq: Two times a day (BID) | ORAL | 0 refills | Status: DC

## 2016-08-05 NOTE — Telephone Encounter (Signed)
Stop the Augmentin and call in Ceftin 500 mg bid #20

## 2016-08-05 NOTE — Telephone Encounter (Signed)
I spoke with pt, sent [facility] to [facility] and removed Augmentin from current mediation list.

## 2016-08-05 NOTE — Telephone Encounter (Signed)
° ° °  Pt call to say she can not take the below med and is asking for something else  amoxicillin-clavulanate (AUGMENTIN) 875-125 MG tablet   [facility]

## 2016-08-07 ENCOUNTER — Ambulatory Visit (INDEPENDENT_AMBULATORY_CARE_PROVIDER_SITE_OTHER): Payer: Self-pay

## 2016-08-07 ENCOUNTER — Telehealth: Payer: Self-pay

## 2016-08-07 ENCOUNTER — Telehealth: Payer: Self-pay | Admitting: Family Medicine

## 2016-08-07 DIAGNOSIS — I5022 Chronic systolic (congestive) heart failure: Secondary | ICD-10-CM

## 2016-08-07 DIAGNOSIS — Z95810 Presence of automatic (implantable) cardiac defibrillator: Secondary | ICD-10-CM

## 2016-08-07 MED ORDER — BENZONATATE 200 MG PO CAPS: 200.0000 mg | ORAL_CAPSULE | Freq: Two times a day (BID) | ORAL | 0 refills | Status: DC | PRN

## 2016-08-07 NOTE — Telephone Encounter (Signed)
Call in Benzonztate 200 mg tot take bid for cough, #60 with no rf

## 2016-08-07 NOTE — Telephone Encounter (Signed)
Returned call to patient as requested by voice mail.  She reported she has had a cold and sinus infection in the last week but the now she is having a lot of coughing at night and unable to sleep.  She wanted to send a remote transmission to check fluid levels and advised assisted her in sending remote.  See ICM note for further information.

## 2016-08-07 NOTE — Progress Notes (Signed)
EPIC Encounter for ICM Monitoring  Patient Name: Joanne Bell is a 56 y.o. female Date: 08/07/2016 Primary Care Physican: [name], MD Primary [place] Electrophysiologist: Faustino Congress Weight:unknown Bi-V Pacing: 99.8%        Received voice mail message to return call.  Pt reported she has had a cold and sinus infection in the last week but the now she is having a lot of coughing at night and unable to sleep.  She wanted to send a remote transmission to check fluid levels and advised assisted her in sending remote.  She is taking anitbiotic and cough syrup but last night she coughed all night.  Explained it may be the sinus drainage making her cough.     Thoracic impedance normal.  No diuretic  Recommendations: Recommended to call PCP office back today to ask if there is something else she can take for the cough.    Follow-up plan: ICM clinic phone appointment on 08/28/2016.    Copy of ICM check sent to device physician.   3 month ICM trend: 08/07/2016   1 Year ICM trend:      [name], RN 08/07/2016 7:54 AM

## 2016-08-07 NOTE — Telephone Encounter (Signed)
[facility] and spoke with pt.  

## 2016-08-07 NOTE — Telephone Encounter (Signed)
Pt calling back stating that she is having problem with her sinuses and have been doing a lot of coughing at night and would like to see if something can be called in for this.  Pharm: [facility]

## 2016-08-11 ENCOUNTER — Telehealth: Payer: Self-pay | Admitting: Family Medicine

## 2016-08-11 NOTE — Telephone Encounter (Signed)
I faxed script to below number, tried to reach pt and no answer.

## 2016-08-11 NOTE — Telephone Encounter (Signed)
Pt needs a return to work note dated 88 /4 . Pt needs confirmation ok for her to return to work.   Please fax to [place]

## 2016-08-11 NOTE — Telephone Encounter (Signed)
I agree, please fax such a note

## 2016-08-28 ENCOUNTER — Ambulatory Visit (INDEPENDENT_AMBULATORY_CARE_PROVIDER_SITE_OTHER): Payer: BLUE CROSS/BLUE SHIELD

## 2016-08-28 DIAGNOSIS — I5022 Chronic systolic (congestive) heart failure: Secondary | ICD-10-CM | POA: Diagnosis not present

## 2016-08-28 DIAGNOSIS — Z95810 Presence of automatic (implantable) cardiac defibrillator: Secondary | ICD-10-CM

## 2016-08-28 NOTE — Progress Notes (Signed)
EPIC Encounter for ICM Monitoring  Patient Name: Joanne Bell is a 56 y.o. female Date: 08/28/2016 Primary Care Physican: [name], MD Primary [place] Electrophysiologist: Faustino Congress Weight:unknown Bi-V Pacing: 98.9%      Attempted call to patient and unable to reach.  Left detailed message regarding transmission.  Transmission reviewed.    Thoracic impedance normal   No diuretic  Recommendations: Left voice mail with ICM number and encouraged to call for fluid symptoms.  Follow-up plan: ICM clinic phone appointment on 09/28/2016.  Office appointment scheduled on 09/08/2016 with Tommye Standard, PA.  Copy of ICM check sent to device physician.   3 month ICM trend: 08/28/2016   1 Year ICM trend:      Joanne Billings, RN 08/28/2016 8:27 AM

## 2016-09-08 ENCOUNTER — Encounter: Payer: BLUE CROSS/BLUE SHIELD | Admitting: Physician Assistant

## 2016-09-28 ENCOUNTER — Ambulatory Visit (INDEPENDENT_AMBULATORY_CARE_PROVIDER_SITE_OTHER): Payer: BLUE CROSS/BLUE SHIELD | Admitting: Physician Assistant

## 2016-09-28 ENCOUNTER — Ambulatory Visit (INDEPENDENT_AMBULATORY_CARE_PROVIDER_SITE_OTHER): Payer: BLUE CROSS/BLUE SHIELD | Admitting: *Deleted

## 2016-09-28 ENCOUNTER — Encounter (INDEPENDENT_AMBULATORY_CARE_PROVIDER_SITE_OTHER): Payer: Self-pay

## 2016-09-28 ENCOUNTER — Encounter: Payer: Self-pay | Admitting: Physician Assistant

## 2016-09-28 DIAGNOSIS — I5022 Chronic systolic (congestive) heart failure: Secondary | ICD-10-CM

## 2016-09-28 DIAGNOSIS — I420 Dilated cardiomyopathy: Secondary | ICD-10-CM

## 2016-09-28 DIAGNOSIS — I442 Atrioventricular block, complete: Secondary | ICD-10-CM

## 2016-09-28 DIAGNOSIS — I428 Other cardiomyopathies: Secondary | ICD-10-CM

## 2016-09-28 DIAGNOSIS — I10 Essential (primary) hypertension: Secondary | ICD-10-CM | POA: Diagnosis not present

## 2016-09-28 DIAGNOSIS — Z95810 Presence of automatic (implantable) cardiac defibrillator: Secondary | ICD-10-CM | POA: Diagnosis not present

## 2016-09-28 MED ORDER — CARVEDILOL 25 MG PO TABS: 25.0000 mg | ORAL_TABLET | Freq: Two times a day (BID) | ORAL | 3 refills | Status: DC

## 2016-09-28 NOTE — Progress Notes (Signed)
error 

## 2016-09-28 NOTE — Patient Instructions (Signed)
Medication Instructions:  Your physician has recommended you make the following change in your medication: 1.) we are increasing your Coreg to 25 mg twice a day.   Labwork: None Ordered   Testing/Procedures: None Ordered   Follow-Up: Your physician wants you to follow-up in: 6 months with Dr. [name].  You will receive a reminder letter in the mail two months in advance. If you don't receive a letter, please call our office to schedule the follow-up appointment.   Any Other Special Instructions Will Be Listed Below (If Applicable).     If you need a refill on your cardiac medications before your next appointment, please call your pharmacy.

## 2016-09-28 NOTE — Progress Notes (Signed)
Cardiology Office Note Date:  09/28/2016  Patient ID:  Joanne Bell, Joanne Bell January 05, 1961, MRN [id number] PCP:  [name], MD  Cardiologist:  Dr. [name]   Chief Complaint:  planned f/u  History of Present Illness: Joanne Bell is a 56 y.o. female with history of NICM, w/CRT-D, chronic CHF, HTN, chronic CHF, and hx of CHB w/prior PPM, OSA, comes in today to be seen for Dr. [name], she was last seen by him in August 2017, at that visit noted high BP losartan was changed to [facility] and mentions goal to add on aldactone.  She was subsequently seen by myself in March, she had not yet started the 97-103mg  dose of Entresto yet, having just refilled her Rx prior to her last visit and did not want to waste that, with plans to add the aldactone.       She is feeling very well, no CP, palpitations, no dizziness, near syncope or syncope.  She reports doing regular exercise, walking, zumba class, and good exertional capacity, no difficulty with her ADLs, no symptoms of PND or orthopnea.    Device information:  MDT CRT-D, implanted 07/19/15, Dr. [name] DEVICE DEPENDENT   Past Medical History:  Diagnosis Date  . Atrioventricular block, complete (HCC)    narrow QRS  . Bilateral ovarian cysts   . 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 Chronic systolic CHF (congestive heart failure), NYHA class 3 ([place])   . Colon polyps   . Fibroids, intramural   . Hypertension   . NICM (nonischemic cardiomyopathy) ([place]) 07/20/2015    Past Surgical History:  Procedure Laterality Date  . COLONOSCOPY  2010   benign polyps, repeat in 5 yrs  . ENDOMETRIAL ABLATION  2004  . EP IMPLANTABLE DEVICE N/A 07/19/2015   Procedure: Upgrade to [place] ICD ;  Surgeon: Deboraha Sprang, MD;  Location: [facility];  Service: Cardiovascular;  Laterality: N/A;  . MYOMECTOMY  1998   per Dr. Garwin Brothers  . PERMANENT PACEMAKER INSERTION Left 02/16/2011   Procedure: PERMANENT PACEMAKER  INSERTION;  Surgeon: Deboraha Sprang, MD;  Location: [facility] CATH LAB;  Service: Cardiovascular;  Laterality: Left;    Current Outpatient Prescriptions  Medication Sig Dispense Refill  . carvedilol (COREG) 12.5 MG tablet TAKE 1 TABLET (12.5 MG TOTAL) BY MOUTH 2 (TWO) TIMES DAILY WITH A MEAL. 180 tablet 1  . omeprazole (PRILOSEC) 40 MG capsule Take 40 mg by mouth daily as needed (for acid reflux or heartburn).   2  . sacubitril-valsartan (ENTRESTO) 97-103 MG Take 1 tablet by mouth 2 (two) times daily. 60 tablet 6  . vitamin E 100 UNIT capsule Take 100 Units by mouth daily as needed (for supplementation).      Current Facility-Administered Medications  Medication Dose Route Frequency Provider Last Rate Last Dose  . ipratropium-albuterol (DUONEB) 0.5-2.5 (3) MG/3ML nebulizer solution 3 mL  3 mL Nebulization Once Nafziger, Tommi Rumps, NP        Allergies:   Acyclovir and related; Clonidine derivatives; and Percocet [oxycodone-acetaminophen]   Social History:  The patient  reports that she has never smoked. She has never used smokeless tobacco. She reports that she does not drink alcohol or use drugs.   Family History:  [name] family history includes Arthritis in her father; Depression in her mother; Diabetes in her father; Hyperlipidemia in her father; Hypertension in her father; Stroke in her father.  ROS:  Please see the history of present illness.  All other  systems are reviewed and otherwise negative.   PHYSICAL EXAM:  VS:  BP (!) 145/90   Pulse 75   Ht 5\' 4"  (1.626 m)   Wt 157 lb (71.2 kg)   SpO2 96%   BMI 26.95 kg/m  BMI: Body mass index is 26.95 kg/m. Well nourished, well developed, in no acute distress  HEENT: normocephalic, atraumatic  Neck: no JVD, carotid bruits or masses Cardiac:  RRR no significant murmurs, no rubs, or gallops Lungs:  CTA b/l, no wheezing, rhonchi or rales  Abd: soft, nontender MS: no deformity or atrophy Ext: no edema  Skin: warm and dry, no rash Neuro:   No gross deficits appreciated Psych: euthymic mood, full affect  ICD site is stable, no tethering or discomfort, minimal keloid formation   EKG:  Done today and reviewed by myself is SB 58bpm, V paced ICD interrogation today and reviewed by myself: battery and lead measurements are good, she is device dependent and 100% BiVe paced, no VT or observations  04/14/16: TTE for AV optimization Study Conclusions - Left ventricle: The cavity size was normal. Wall thickness was   normal. Systolic function was mildly reduced. The estimated   ejection fraction was in the range of 45% to 50%. Diffuse   hypokinesis. Doppler parameters are consistent with abnormal left   ventricular relaxation (grade 1 diastolic dysfunction). - Mitral valve: There was no significant regurgitation. - Right ventricle: The cavity size was normal. Pacer wire or   catheter noted in right ventricle. Systolic function was normal. - Tricuspid valve: Peak RV-RA gradient (S): 23 mm Hg. - Pulmonary arteries: PA peak pressure: 26 mm Hg (S). - Inferior vena cava: The vessel was normal in size. The   respirophasic diameter changes were in the normal range (>= 50%),   consistent with normal central venous pressure. - Pericardium, extracardiac: A trivial pericardial effusion was   identified posterior to the heart. Impressions: - Limited echo for AV optimization.  01/24/16: TTE EF 30-35%  04/24/15: TTE The estimated ejection fraction was in the range of 30% to 35%. Hypokinesis of the inferior   myocardium. Severe hypokinesis of the apical myocardium. Doppler   parameters are consistent with abnormal left ventricular   relaxation (grade 1 diastolic dysfunction).  Recent Labs: No results found for requested labs within last 8760 hours.  No results found for requested labs within last 8760 hours.   CrCl cannot be calculated (Patient's most recent lab result is older than the maximum 21 days allowed.).   Wt Readings from  Last 3 Encounters:  09/28/16 157 lb (71.2 kg)  08/04/16 155 lb (70.3 kg)  04/21/16 161 lb (73 kg)     Other studies reviewed: Additional studies/records reviewed today include: summarized above  ASSESSMENT AND PLAN:  1. NICM w/CRTD     normal device functio     euvolemic by exam and OptiVol      On Entresto and BB, will up-titrate her coreg to 25mg  BID discussed adding aldactone to her regime though she is very resistant to the idea of diuretic, and reports that she does not want another medicine to take  2. CHB (prior PPM)     Now w/ICD  3. HTN     Elevated today she didn't take her Entresto yesterday and thinks this is why though admits her afternoon BP tend to be elevated     Discussed importance but again she is firm against a 3rd agent  Disposition: She will continue monthly ICM evaluations,  Q 3 month remote device checks and will see her back in 6 months sooner if needed.  Current medicines are reviewed at length with the patient today.  The patient did not have any concerns regarding medicines.  Haywood Lasso, PA-C 09/28/2016 3:11 PM     [address] [place] [address] [phone] (office)  [phone] (fax)

## 2016-10-07 NOTE — Progress Notes (Signed)
Remote ICD transmission.   

## 2016-10-08 ENCOUNTER — Encounter: Payer: Self-pay | Admitting: Cardiology

## 2016-10-17 LAB — CUP PACEART REMOTE DEVICE CHECK
Brady Statistic AS VP Percent: 99.69 %
Brady Statistic RA Percent Paced: 0.31 %
Brady Statistic RV Percent Paced: 99.56 %
HighPow Impedance: 54 Ohm
Implantable Lead Implant Date: 20121210
Implantable Lead Implant Date: 20170512
Implantable Lead Implant Date: 20170512
Implantable Lead Location: 753858
Implantable Lead Location: 753859
Implantable Lead Model: 4598
Implantable Pulse Generator Implant Date: 20170512
Lead Channel Impedance Value: 418 Ohm
Lead Channel Impedance Value: 418 Ohm
Lead Channel Impedance Value: 456 Ohm
Lead Channel Impedance Value: 456 Ohm
Lead Channel Impedance Value: 532 Ohm
Lead Channel Impedance Value: 760 Ohm
Lead Channel Impedance Value: 817 Ohm
Lead Channel Impedance Value: 836 Ohm
Lead Channel Pacing Threshold Amplitude: 0.875 V
Lead Channel Pacing Threshold Pulse Width: 0.4 ms
Lead Channel Pacing Threshold Pulse Width: 0.4 ms
Lead Channel Sensing Intrinsic Amplitude: 0.875 mV
Lead Channel Sensing Intrinsic Amplitude: 3.375 mV
Lead Channel Sensing Intrinsic Amplitude: 3.375 mV
Lead Channel Setting Pacing Amplitude: 1.5 V
Lead Channel Setting Pacing Pulse Width: 0.4 ms
Lead Channel Setting Pacing Pulse Width: 0.8 ms
Lead Channel Setting Sensing Sensitivity: 0.3 mV
MDC IDC LEAD LOCATION: 753860
MDC IDC MSMT BATTERY REMAINING LONGEVITY: 85 mo
MDC IDC MSMT BATTERY VOLTAGE: 2.98 V
MDC IDC MSMT LEADCHNL LV IMPEDANCE VALUE: 399 Ohm
MDC IDC MSMT LEADCHNL LV IMPEDANCE VALUE: 456 Ohm
MDC IDC MSMT LEADCHNL LV IMPEDANCE VALUE: 703 Ohm
MDC IDC MSMT LEADCHNL LV IMPEDANCE VALUE: 817 Ohm
MDC IDC MSMT LEADCHNL LV PACING THRESHOLD PULSEWIDTH: 0.8 ms
MDC IDC MSMT LEADCHNL RA PACING THRESHOLD AMPLITUDE: 0.5 V
MDC IDC MSMT LEADCHNL RV IMPEDANCE VALUE: 399 Ohm
MDC IDC MSMT LEADCHNL RV PACING THRESHOLD AMPLITUDE: 0.5 V
MDC IDC MSMT LEADCHNL RV SENSING INTR AMPL: 0.875 mV
MDC IDC SESS DTM: 20180723122603
MDC IDC SET LEADCHNL LV PACING AMPLITUDE: 2 V
MDC IDC SET LEADCHNL RV PACING AMPLITUDE: 2 V
MDC IDC STAT BRADY AP VP PERCENT: 0.3 %
MDC IDC STAT BRADY AP VS PERCENT: 0.01 %
MDC IDC STAT BRADY AS VS PERCENT: 0 %

## 2016-10-27 ENCOUNTER — Other Ambulatory Visit: Payer: Self-pay | Admitting: Family Medicine

## 2016-10-28 NOTE — Telephone Encounter (Signed)
Is pt still taking this medication, hasn't been filled in a long time?

## 2016-10-29 ENCOUNTER — Telehealth: Payer: Self-pay

## 2016-10-29 ENCOUNTER — Other Ambulatory Visit: Payer: Self-pay | Admitting: Family Medicine

## 2016-10-29 ENCOUNTER — Ambulatory Visit (INDEPENDENT_AMBULATORY_CARE_PROVIDER_SITE_OTHER): Payer: BLUE CROSS/BLUE SHIELD

## 2016-10-29 DIAGNOSIS — Z95810 Presence of automatic (implantable) cardiac defibrillator: Secondary | ICD-10-CM

## 2016-10-29 DIAGNOSIS — I5022 Chronic systolic (congestive) heart failure: Secondary | ICD-10-CM | POA: Diagnosis not present

## 2016-10-29 NOTE — Progress Notes (Signed)
EPIC Encounter for ICM Monitoring  Patient Name: Joanne Bell is a 56 y.o. female Date: 10/29/2016 Primary Care Physican: [name], MD Primary [place] Electrophysiologist: Faustino Congress Weight:unknown Bi-V Pacing: 99.9%       Attempted call to patient and unable to reach.  Left detailed message regarding transmission.  Transmission reviewed.    Thoracic impedance normal.  No diuretic  Recommendations: Left voice mail with ICM number and encouraged to call for fluid symptoms.  Follow-up plan: ICM clinic phone appointment on 11/30/2016.    Copy of ICM check sent to Dr. [name].   3 month ICM trend: 10/29/2016   1 Year ICM trend:      [name], RN 10/29/2016 12:09 PM

## 2016-10-29 NOTE — Telephone Encounter (Signed)
Remote ICM transmission received.  Attempted patient call and left detailed message regarding transmission and next ICM scheduled for 11/30/2016.  Advised to return call for any fluid symptoms or questions.

## 2016-11-30 ENCOUNTER — Telehealth: Payer: Self-pay

## 2016-11-30 ENCOUNTER — Ambulatory Visit (INDEPENDENT_AMBULATORY_CARE_PROVIDER_SITE_OTHER): Payer: BLUE CROSS/BLUE SHIELD

## 2016-11-30 DIAGNOSIS — I5022 Chronic systolic (congestive) heart failure: Secondary | ICD-10-CM | POA: Diagnosis not present

## 2016-11-30 DIAGNOSIS — Z95810 Presence of automatic (implantable) cardiac defibrillator: Secondary | ICD-10-CM | POA: Diagnosis not present

## 2016-11-30 NOTE — Progress Notes (Signed)
EPIC Encounter for ICM Monitoring  Patient Name: Joanne Bell is a 56 y.o. female Date: 11/30/2016 Primary Care Physican: [name], MD Primary [place] Electrophysiologist: Faustino Congress Weight:unknown Bi-V Pacing: 99.9%      Attempted call to patient and unable to reach.  Left detailed message regarding transmission.  Transmission reviewed.    Thoracic impedance normal.  No diuretic  Recommendations:  Left voice mail with ICM number and encouraged to call for fluid symptoms.   Follow-up plan: ICM clinic phone appointment on 12/31/2016.   Copy of ICM check sent to Dr. [name].   3 month ICM trend: 11/30/2016   1 Year ICM trend:      [name], RN 11/30/2016 10:01 AM

## 2016-11-30 NOTE — Telephone Encounter (Signed)
Remote ICM transmission received.  Attempted call to patient and left detailed message regarding transmission and next ICM scheduled for 12/31/2016.  Advised to return call for any fluid symptoms or questions.  '

## 2016-12-07 ENCOUNTER — Other Ambulatory Visit: Payer: Self-pay | Admitting: Family Medicine

## 2016-12-22 ENCOUNTER — Ambulatory Visit (INDEPENDENT_AMBULATORY_CARE_PROVIDER_SITE_OTHER): Payer: BLUE CROSS/BLUE SHIELD | Admitting: Family Medicine

## 2016-12-22 ENCOUNTER — Encounter: Payer: Self-pay | Admitting: Family Medicine

## 2016-12-22 VITALS — HR 98 | Temp 100.0°F

## 2016-12-22 DIAGNOSIS — J018 Other acute sinusitis: Secondary | ICD-10-CM | POA: Diagnosis not present

## 2016-12-22 MED ORDER — CEFUROXIME AXETIL 500 MG PO TABS: 500.0000 mg | ORAL_TABLET | Freq: Two times a day (BID) | ORAL | 0 refills | Status: DC

## 2016-12-22 MED ORDER — HYDROCODONE-HOMATROPINE 5-1.5 MG/5ML PO SYRP: 5.0000 mL | ORAL_SOLUTION | ORAL | 0 refills | Status: DC | PRN

## 2016-12-22 NOTE — Patient Instructions (Signed)
WE NOW OFFER   [facility] FAST TRACK!!!  SAME DAY Appointments for ACUTE CARE  Such as: Sprains, Injuries, cuts, abrasions, rashes, muscle pain, joint pain, back pain Colds, flu, sore throats, headache, allergies, cough, fever  Ear pain, sinus and eye infections Abdominal pain, nausea, vomiting, diarrhea, upset stomach Animal/insect bites  3 Easy Ways to Schedule: Walk-In Scheduling Call in scheduling Mychart Sign-up: [email or web address]         

## 2016-12-22 NOTE — Progress Notes (Signed)
   Subjective:    Patient ID: GWENDLYN HANBACK, female    DOB: [birth year], 56 y.o.   MRN: [id number]  HPI Here for 5 days of sinus pressure, headache, PND, and coughing up green sputum. Low grade fevers.    Review of Systems  Constitutional: Positive for fever.  HENT: Positive for congestion, postnasal drip, sinus pain and sinus pressure. Negative for sore throat.   Eyes: Negative.   Respiratory: Positive for cough.        Objective:   Physical Exam  Constitutional:  Appears ill   HENT:  Right Ear: External ear normal.  Left Ear: External ear normal.  Nose: Nose normal.  Mouth/Throat: Oropharynx is clear and moist.  Eyes: Conjunctivae are normal.  Neck: No thyromegaly present.  Pulmonary/Chest: Effort normal and breath sounds normal. No respiratory distress. She has no wheezes. She has no rales.  Lymphadenopathy:    She has no cervical adenopathy.          Assessment & Plan:  Sinusitis, treat with Ceftin. Written out of work today and tomorrow.  Alysia Penna, MD

## 2016-12-29 ENCOUNTER — Other Ambulatory Visit: Payer: Self-pay | Admitting: Physician Assistant

## 2016-12-31 ENCOUNTER — Telehealth: Payer: Self-pay

## 2016-12-31 ENCOUNTER — Ambulatory Visit (INDEPENDENT_AMBULATORY_CARE_PROVIDER_SITE_OTHER): Payer: BLUE CROSS/BLUE SHIELD | Admitting: *Deleted

## 2016-12-31 DIAGNOSIS — Z95810 Presence of automatic (implantable) cardiac defibrillator: Secondary | ICD-10-CM

## 2016-12-31 DIAGNOSIS — I5022 Chronic systolic (congestive) heart failure: Secondary | ICD-10-CM | POA: Diagnosis not present

## 2016-12-31 DIAGNOSIS — I442 Atrioventricular block, complete: Secondary | ICD-10-CM | POA: Diagnosis not present

## 2016-12-31 NOTE — Telephone Encounter (Signed)
Remote ICM transmission received.  Attempted call to patient and left detailed message per DPR regarding transmission and next ICM scheduled for 02/04/2017.  Advised to return call for any fluid symptoms or questions.

## 2016-12-31 NOTE — Progress Notes (Signed)
EPIC Encounter for ICM Monitoring  Patient Name: Joanne Bell is a 56 y.o. female Date: 12/31/2016 Primary Care Physican: [name], MD Primary [place] Electrophysiologist: Faustino Congress Weight:unknown Bi-V Pacing: 99.3%                                                Attempted call to patient and unable to reach.  Left detailed message regarding transmission.  Transmission reviewed.    Thoracic impedance normal.  No diuretic  Recommendations:  Left voice mail with ICM number and encouraged to call for fluid symptoms.  Follow-up plan: ICM clinic phone appointment on 02/04/2017.    Copy of ICM check sent to Dr. [name].   3 month ICM trend: 12/31/2016   1 Year ICM trend:      [name], RN 12/31/2016 4:11 PM

## 2016-12-31 NOTE — Progress Notes (Signed)
Remote ICD transmission.   

## 2017-01-01 ENCOUNTER — Encounter: Payer: Self-pay | Admitting: Cardiology

## 2017-01-01 LAB — CUP PACEART REMOTE DEVICE CHECK
Brady Statistic AP VP Percent: 1.75 %
Brady Statistic AS VP Percent: 98.24 %
Brady Statistic AS VS Percent: 0 %
HighPow Impedance: 53 Ohm
Implantable Lead Location: 753859
Implantable Lead Location: 753860
Lead Channel Impedance Value: 342 Ohm
Lead Channel Impedance Value: 342 Ohm
Lead Channel Impedance Value: 399 Ohm
Lead Channel Impedance Value: 399 Ohm
Lead Channel Impedance Value: 646 Ohm
Lead Channel Impedance Value: 646 Ohm
Lead Channel Pacing Threshold Amplitude: 0.5 V
Lead Channel Pacing Threshold Amplitude: 0.5 V
Lead Channel Pacing Threshold Amplitude: 0.875 V
Lead Channel Pacing Threshold Pulse Width: 0.4 ms
Lead Channel Pacing Threshold Pulse Width: 0.8 ms
Lead Channel Sensing Intrinsic Amplitude: 2.875 mV
Lead Channel Setting Pacing Amplitude: 2 V
Lead Channel Setting Sensing Sensitivity: 0.3 mV
MDC IDC LEAD IMPLANT DT: 20121210
MDC IDC LEAD IMPLANT DT: 20170512
MDC IDC LEAD IMPLANT DT: 20170512
MDC IDC LEAD LOCATION: 753858
MDC IDC MSMT BATTERY REMAINING LONGEVITY: 80 mo
MDC IDC MSMT BATTERY VOLTAGE: 2.99 V
MDC IDC MSMT LEADCHNL LV IMPEDANCE VALUE: 418 Ohm
MDC IDC MSMT LEADCHNL LV IMPEDANCE VALUE: 418 Ohm
MDC IDC MSMT LEADCHNL LV IMPEDANCE VALUE: 456 Ohm
MDC IDC MSMT LEADCHNL LV IMPEDANCE VALUE: 608 Ohm
MDC IDC MSMT LEADCHNL LV IMPEDANCE VALUE: 608 Ohm
MDC IDC MSMT LEADCHNL LV IMPEDANCE VALUE: 722 Ohm
MDC IDC MSMT LEADCHNL RA PACING THRESHOLD PULSEWIDTH: 0.4 ms
MDC IDC MSMT LEADCHNL RA SENSING INTR AMPL: 2.875 mV
MDC IDC MSMT LEADCHNL RV IMPEDANCE VALUE: 513 Ohm
MDC IDC MSMT LEADCHNL RV SENSING INTR AMPL: 0.875 mV
MDC IDC MSMT LEADCHNL RV SENSING INTR AMPL: 0.875 mV
MDC IDC PG IMPLANT DT: 20170512
MDC IDC SESS DTM: 20181025052504
MDC IDC SET LEADCHNL LV PACING AMPLITUDE: 2 V
MDC IDC SET LEADCHNL LV PACING PULSEWIDTH: 0.8 ms
MDC IDC SET LEADCHNL RA PACING AMPLITUDE: 1.5 V
MDC IDC SET LEADCHNL RV PACING PULSEWIDTH: 0.4 ms
MDC IDC STAT BRADY AP VS PERCENT: 0.01 %
MDC IDC STAT BRADY RA PERCENT PACED: 1.76 %
MDC IDC STAT BRADY RV PERCENT PACED: 99.29 %

## 2017-01-08 ENCOUNTER — Other Ambulatory Visit: Payer: Self-pay | Admitting: Family Medicine

## 2017-01-08 DIAGNOSIS — Z139 Encounter for screening, unspecified: Secondary | ICD-10-CM

## 2017-02-04 ENCOUNTER — Ambulatory Visit (INDEPENDENT_AMBULATORY_CARE_PROVIDER_SITE_OTHER): Payer: BLUE CROSS/BLUE SHIELD

## 2017-02-04 DIAGNOSIS — I5022 Chronic systolic (congestive) heart failure: Secondary | ICD-10-CM

## 2017-02-04 DIAGNOSIS — Z95810 Presence of automatic (implantable) cardiac defibrillator: Secondary | ICD-10-CM | POA: Diagnosis not present

## 2017-02-05 ENCOUNTER — Ambulatory Visit
Admission: RE | Admit: 2017-02-05 | Discharge: 2017-02-05 | Disposition: A | Discharge status: Home / Self Care | Payer: BLUE CROSS/BLUE SHIELD | Source: Ambulatory Visit | Attending: Family Medicine | Admitting: Family Medicine

## 2017-02-05 ENCOUNTER — Telehealth: Payer: Self-pay

## 2017-02-05 ENCOUNTER — Ambulatory Visit: Payer: BLUE CROSS/BLUE SHIELD

## 2017-02-05 DIAGNOSIS — Z139 Encounter for screening, unspecified: Secondary | ICD-10-CM

## 2017-02-05 DIAGNOSIS — Z1231 Encounter for screening mammogram for malignant neoplasm of breast: Secondary | ICD-10-CM | POA: Diagnosis not present

## 2017-02-05 NOTE — Telephone Encounter (Signed)
Remote ICM transmission received.  Attempted call to patient and left detailed message per DPR regarding transmission and next ICM scheduled for 03/11/2017.  Advised to return call for any fluid symptoms or questions.

## 2017-02-05 NOTE — Progress Notes (Signed)
EPIC Encounter for ICM Monitoring  Patient Name: Joanne Bell is a 57 y.o. female Date: 02/05/2017 Primary Care Physican: [name], MD Primary [place] Electrophysiologist: Faustino Congress Weight:unknown Bi-V Pacing: 99.8%             Attempted call to patient and unable to reach.  Left detailed message regarding transmission.  Transmission reviewed.   Thoracic impedance normal.  Prescribed dosage: No diuretic  Recommendations: Left voice mail with ICM number and encouraged to call if experiencing any fluid symptoms.  Follow-up plan: ICM clinic phone appointment on 03/11/2017.    Copy of ICM check sent to Dr. [name].   3 month ICM trend: 02/04/2017    1 Year ICM trend:       [name], RN 02/05/2017 12:37 PM

## 2017-03-08 ENCOUNTER — Ambulatory Visit: Payer: Self-pay

## 2017-03-08 NOTE — Telephone Encounter (Signed)
Pt. Called to report a rash on her left mid buttocks that appears very similar to Shingles she has had in the past.  reported the rash is pink and raised with some areas starting to drain a pus-like fluid.  Stated the rash erupted 3 days ago.  Reported symptoms of mild itching and pain.  Denies fever.  Denies being immunocompromised.  Offered to sched. Appt. on 03/10/17 with another provider in the [place] office, but the pt. declined seeing anyone other than her PCP.  Care advice given per protocol.  Pt. stated she will call back on 03/10/17 to try to get an appt. On 1/3, as no available appts. at this time.  Care advice per protocol. Verb. Understanding.    Reason for Disposition . [1] Shingles rash AND [2] onset > 72 hours ago  Answer Assessment - Initial Assessment Questions 1. APPEARANCE of RASH: "Describe the rash."      Pink raised rash ; some areas starting to drain pus-like fluid  2. LOCATION: "Where is the rash located?"      Middle of left buttock cheek toward the buttock fold 3. ONSET: "When did the rash start?"      3 days ago 4. ITCHING: "Does the rash itch?" If so, ask: "How bad is the itch?"  (Scale 1-10; or mild, moderate, severe)    Painful and itching 5. PAIN: "Does the rash hurt?" If so, ask: "How bad is the pain?"  (Scale 1-10; or mild, moderate, severe)    Mild with rubbing against it. 6. OTHER SYMPTOMS: "Do you have any other symptoms?" (e.g., fever)     No 7. PREGNANCY: "Is there any chance you are pregnant?" "When was your last menstrual period?"     no  Protocols used: [facility]

## 2017-03-11 ENCOUNTER — Ambulatory Visit (INDEPENDENT_AMBULATORY_CARE_PROVIDER_SITE_OTHER): Payer: BLUE CROSS/BLUE SHIELD

## 2017-03-11 DIAGNOSIS — I5022 Chronic systolic (congestive) heart failure: Secondary | ICD-10-CM

## 2017-03-11 DIAGNOSIS — Z95810 Presence of automatic (implantable) cardiac defibrillator: Secondary | ICD-10-CM | POA: Diagnosis not present

## 2017-03-11 NOTE — Progress Notes (Signed)
EPIC Encounter for ICM Monitoring  Patient Name: Joanne Bell is a 57 y.o. female Date: 03/11/2017 Primary Care Physican: [name], MD Primary [place] Electrophysiologist: Faustino Congress Weight:unknown Bi-V Pacing: 99.2%           Attempted call to patient and unable to reach.   Transmission reviewed.    Thoracic impedance normal.  No diuretic  Recommendations: NONE - Unable to reach.  Follow-up plan: ICM clinic phone appointment on 04/12/2017.  .  Copy of ICM check sent to Dr. [name].   3 month ICM trend: 03/11/2017    1 Year ICM trend:       Joanne Billings, RN 03/11/2017 5:16 PM

## 2017-03-12 ENCOUNTER — Ambulatory Visit: Payer: BLUE CROSS/BLUE SHIELD | Admitting: Family Medicine

## 2017-03-12 ENCOUNTER — Telehealth: Payer: Self-pay

## 2017-03-12 ENCOUNTER — Encounter: Payer: Self-pay | Admitting: Family Medicine

## 2017-03-12 VITALS — BP 120/80 | HR 61 | Temp 98.7°F | Wt 161.8 lb

## 2017-03-12 DIAGNOSIS — B029 Zoster without complications: Secondary | ICD-10-CM | POA: Diagnosis not present

## 2017-03-12 DIAGNOSIS — M542 Cervicalgia: Secondary | ICD-10-CM | POA: Diagnosis not present

## 2017-03-12 MED ORDER — METHYLPREDNISOLONE 4 MG PO TBPK: ORAL_TABLET | ORAL | 0 refills | Status: DC

## 2017-03-12 MED ORDER — VALACYCLOVIR HCL 1 G PO TABS: 1000.0000 mg | ORAL_TABLET | Freq: Three times a day (TID) | ORAL | 0 refills | Status: DC

## 2017-03-12 NOTE — Telephone Encounter (Signed)
Remote ICM transmission received.  Attempted call to patient and no answer   

## 2017-03-12 NOTE — Progress Notes (Signed)
   Subjective:    Patient ID: Joanne Bell, female    DOB: 1961/02/03, 57 y.o.   MRN: [id number]  HPI Here for 2 problems. First about 3 weeks ago she developed a pain in the left neck which shoots down the left arm at times. No recent trauma. No weakness or numbness. Also 10 days ago she developed a rash on the left buttock that itches and burns. It is actually going away now. She has had shingles in the same spot twice before.    Review of Systems  Constitutional: Negative.   Respiratory: Negative.   Cardiovascular: Negative.   Musculoskeletal: Positive for neck pain.  Skin: Positive for rash.       Objective:   Physical Exam  Constitutional: She appears well-developed and well-nourished.  Cardiovascular: Normal rate, regular rhythm, normal heart sounds and intact distal pulses.  Pulmonary/Chest: Effort normal and breath sounds normal. No respiratory distress. She has no wheezes. She has no rales.  Musculoskeletal:  She is tender in the left neck and over the upper left trapezius. 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 She has a pinched nerve in the neck. Try moist heat. The steroid pack should help this as well.  Alysia Penna, MD

## 2017-03-16 ENCOUNTER — Ambulatory Visit: Payer: Self-pay | Admitting: *Deleted

## 2017-03-16 NOTE — Telephone Encounter (Signed)
She should stop the Prednisone and finish out the Valtrex. There is no substitute for the prednisone

## 2017-03-16 NOTE — Telephone Encounter (Signed)
   Reason for Disposition . Caller has URGENT medication question about med that PCP prescribed and triager unable to answer question  Answer Assessment - Initial Assessment Questions 1. SYMPTOMS: "Do you have any symptoms?"     Patient is having headache,sweats and insomnia 2. SEVERITY: If symptoms are present, ask "Are they mild, moderate or severe?"     Severe symptoms- she has stopped taking the antiviral- in review of her medications- she has stopped the wrong medication- she needs to stop the prednisone. Patient is going to stop the prednisone- push fluids and continue her antiviral.  I will let the provider know that she is not tolerating the prednisone and ask if she should be given something different. She has taken the prednisone all day yesterday and a dose this morning. Patient reports no allergic reactions- no new rash or hives on body related to medication.  Protocols used: MEDICATION QUESTION CALL-A-AH

## 2017-03-16 NOTE — Telephone Encounter (Signed)
Sent to PCP ?

## 2017-03-17 NOTE — Telephone Encounter (Signed)
Called pt and left a VM to call back.  

## 2017-03-17 NOTE — Telephone Encounter (Signed)
I spoke with pt and went over advice from provider. Pt requested to add Prednisone to her allergy list which I did.

## 2017-04-01 DIAGNOSIS — H04123 Dry eye syndrome of bilateral lacrimal glands: Secondary | ICD-10-CM | POA: Diagnosis not present

## 2017-04-01 DIAGNOSIS — H2513 Age-related nuclear cataract, bilateral: Secondary | ICD-10-CM | POA: Diagnosis not present

## 2017-04-02 ENCOUNTER — Other Ambulatory Visit: Payer: Self-pay | Admitting: Obstetrics and Gynecology

## 2017-04-12 ENCOUNTER — Telehealth: Payer: Self-pay

## 2017-04-12 ENCOUNTER — Ambulatory Visit (INDEPENDENT_AMBULATORY_CARE_PROVIDER_SITE_OTHER): Payer: BLUE CROSS/BLUE SHIELD | Admitting: *Deleted

## 2017-04-12 DIAGNOSIS — I420 Dilated cardiomyopathy: Secondary | ICD-10-CM

## 2017-04-12 DIAGNOSIS — I442 Atrioventricular block, complete: Secondary | ICD-10-CM

## 2017-04-12 DIAGNOSIS — Z95810 Presence of automatic (implantable) cardiac defibrillator: Secondary | ICD-10-CM

## 2017-04-12 DIAGNOSIS — I5022 Chronic systolic (congestive) heart failure: Secondary | ICD-10-CM

## 2017-04-12 NOTE — Progress Notes (Signed)
Remote ICD transmission.   

## 2017-04-12 NOTE — Telephone Encounter (Signed)
Remote ICM transmission received.  Attempted call to patient and left detailed message per DPR regarding transmission and next ICM scheduled for 06/14/2017.  Advised to return call for any fluid symptoms or questions.    

## 2017-04-12 NOTE — Progress Notes (Signed)
EPIC Encounter for ICM Monitoring  Patient Name: Joanne Bell is a 57 y.o. female Date: 04/12/2017 Primary Care Physican: [name], MD Primary [place] Electrophysiologist: Faustino Congress Weight:unknown Bi-V Pacing: 97.1%       Attempted call to patient and unable to reach.  Left detailed message regarding transmission.  Transmission reviewed.    Thoracic impedance normal.  No diuretic  Recommendations: Left voice mail with ICM number and encouraged to call if experiencing any fluid symptoms.  Follow-up plan: ICM clinic phone appointment on 06/14/2017.  Office appointment scheduled 05/13/2017 with Dr. [name].  Copy of ICM check sent to Dr. [name].   3 month ICM trend: 04/12/2017    1 Year ICM trend:       [name], RN 04/12/2017 10:34 AM

## 2017-04-13 ENCOUNTER — Encounter: Payer: Self-pay | Admitting: Cardiology

## 2017-04-21 DIAGNOSIS — Z01419 Encounter for gynecological examination (general) (routine) without abnormal findings: Secondary | ICD-10-CM | POA: Diagnosis not present

## 2017-04-21 DIAGNOSIS — Z6827 Body mass index (BMI) 27.0-27.9, adult: Secondary | ICD-10-CM | POA: Diagnosis not present

## 2017-05-04 LAB — CUP PACEART REMOTE DEVICE CHECK
Brady Statistic AP VP Percent: 5.44 %
Brady Statistic AP VS Percent: 0.01 %
Brady Statistic AS VP Percent: 94.55 %
Brady Statistic RA Percent Paced: 5.4 %
Brady Statistic RV Percent Paced: 97.14 %
HighPow Impedance: 53 Ohm
Implantable Lead Implant Date: 20121210
Implantable Lead Implant Date: 20170512
Implantable Lead Location: 753858
Implantable Lead Location: 753859
Lead Channel Impedance Value: 342 Ohm
Lead Channel Impedance Value: 399 Ohm
Lead Channel Impedance Value: 418 Ohm
Lead Channel Impedance Value: 475 Ohm
Lead Channel Impedance Value: 608 Ohm
Lead Channel Impedance Value: 703 Ohm
Lead Channel Pacing Threshold Amplitude: 0.875 V
Lead Channel Pacing Threshold Pulse Width: 0.4 ms
Lead Channel Pacing Threshold Pulse Width: 0.4 ms
Lead Channel Sensing Intrinsic Amplitude: 0.875 mV
Lead Channel Sensing Intrinsic Amplitude: 0.875 mV
Lead Channel Sensing Intrinsic Amplitude: 3.375 mV
Lead Channel Sensing Intrinsic Amplitude: 3.375 mV
Lead Channel Setting Pacing Amplitude: 1.5 V
Lead Channel Setting Pacing Amplitude: 2 V
Lead Channel Setting Pacing Pulse Width: 0.8 ms
MDC IDC LEAD IMPLANT DT: 20170512
MDC IDC LEAD LOCATION: 753860
MDC IDC MSMT BATTERY REMAINING LONGEVITY: 77 mo
MDC IDC MSMT BATTERY VOLTAGE: 2.98 V
MDC IDC MSMT LEADCHNL LV IMPEDANCE VALUE: 342 Ohm
MDC IDC MSMT LEADCHNL LV IMPEDANCE VALUE: 399 Ohm
MDC IDC MSMT LEADCHNL LV IMPEDANCE VALUE: 608 Ohm
MDC IDC MSMT LEADCHNL LV IMPEDANCE VALUE: 722 Ohm
MDC IDC MSMT LEADCHNL LV IMPEDANCE VALUE: 760 Ohm
MDC IDC MSMT LEADCHNL LV PACING THRESHOLD PULSEWIDTH: 0.8 ms
MDC IDC MSMT LEADCHNL RA PACING THRESHOLD AMPLITUDE: 0.5 V
MDC IDC MSMT LEADCHNL RV IMPEDANCE VALUE: 418 Ohm
MDC IDC MSMT LEADCHNL RV IMPEDANCE VALUE: 513 Ohm
MDC IDC MSMT LEADCHNL RV PACING THRESHOLD AMPLITUDE: 0.5 V
MDC IDC PG IMPLANT DT: 20170512
MDC IDC SESS DTM: 20190204083623
MDC IDC SET LEADCHNL RV PACING AMPLITUDE: 2 V
MDC IDC SET LEADCHNL RV PACING PULSEWIDTH: 0.4 ms
MDC IDC SET LEADCHNL RV SENSING SENSITIVITY: 0.3 mV
MDC IDC STAT BRADY AS VS PERCENT: 0 %

## 2017-05-13 ENCOUNTER — Ambulatory Visit (INDEPENDENT_AMBULATORY_CARE_PROVIDER_SITE_OTHER): Payer: BLUE CROSS/BLUE SHIELD | Admitting: Internal Medicine

## 2017-05-13 ENCOUNTER — Encounter: Payer: Self-pay | Admitting: Internal Medicine

## 2017-05-13 VITALS — BP 160/90 | HR 64 | Ht 64.0 in | Wt 164.0 lb

## 2017-05-13 DIAGNOSIS — I5022 Chronic systolic (congestive) heart failure: Secondary | ICD-10-CM | POA: Diagnosis not present

## 2017-05-13 DIAGNOSIS — I442 Atrioventricular block, complete: Secondary | ICD-10-CM

## 2017-05-13 DIAGNOSIS — Z95810 Presence of automatic (implantable) cardiac defibrillator: Secondary | ICD-10-CM

## 2017-05-13 LAB — CUP PACEART INCLINIC DEVICE CHECK
Brady Statistic AP VP Percent: 2.19 %
Brady Statistic AP VS Percent: 0.01 %
Brady Statistic AS VP Percent: 97.8 %
Brady Statistic RV Percent Paced: 98.84 %
Date Time Interrogation Session: 20190307171341
HighPow Impedance: 54 Ohm
Implantable Lead Implant Date: 20170512
Implantable Lead Location: 753858
Lead Channel Impedance Value: 399 Ohm
Lead Channel Impedance Value: 399 Ohm
Lead Channel Impedance Value: 418 Ohm
Lead Channel Impedance Value: 418 Ohm
Lead Channel Impedance Value: 475 Ohm
Lead Channel Impedance Value: 475 Ohm
Lead Channel Impedance Value: 760 Ohm
Lead Channel Pacing Threshold Amplitude: 0.5 V
Lead Channel Pacing Threshold Amplitude: 1 V
Lead Channel Pacing Threshold Pulse Width: 0.4 ms
Lead Channel Sensing Intrinsic Amplitude: 3.625 mV
Lead Channel Setting Pacing Amplitude: 2 V
Lead Channel Setting Pacing Amplitude: 2 V
Lead Channel Setting Pacing Pulse Width: 0.4 ms
Lead Channel Setting Pacing Pulse Width: 0.8 ms
MDC IDC LEAD IMPLANT DT: 20121210
MDC IDC LEAD IMPLANT DT: 20170512
MDC IDC LEAD LOCATION: 753859
MDC IDC LEAD LOCATION: 753860
MDC IDC MSMT BATTERY REMAINING LONGEVITY: 76 mo
MDC IDC MSMT BATTERY VOLTAGE: 2.97 V
MDC IDC MSMT LEADCHNL LV IMPEDANCE VALUE: 456 Ohm
MDC IDC MSMT LEADCHNL LV IMPEDANCE VALUE: 646 Ohm
MDC IDC MSMT LEADCHNL LV IMPEDANCE VALUE: 665 Ohm
MDC IDC MSMT LEADCHNL LV IMPEDANCE VALUE: 703 Ohm
MDC IDC MSMT LEADCHNL LV IMPEDANCE VALUE: 779 Ohm
MDC IDC MSMT LEADCHNL LV PACING THRESHOLD PULSEWIDTH: 0.8 ms
MDC IDC MSMT LEADCHNL RA PACING THRESHOLD AMPLITUDE: 0.5 V
MDC IDC MSMT LEADCHNL RA PACING THRESHOLD PULSEWIDTH: 0.4 ms
MDC IDC MSMT LEADCHNL RV IMPEDANCE VALUE: 532 Ohm
MDC IDC PG IMPLANT DT: 20170512
MDC IDC SET LEADCHNL RA PACING AMPLITUDE: 1.5 V
MDC IDC SET LEADCHNL RV SENSING SENSITIVITY: 0.3 mV
MDC IDC STAT BRADY AS VS PERCENT: 0 %
MDC IDC STAT BRADY RA PERCENT PACED: 2.19 %

## 2017-05-13 MED ORDER — CARVEDILOL 25 MG PO TABS: 25.0000 mg | ORAL_TABLET | Freq: Two times a day (BID) | ORAL | 3 refills | Status: DC

## 2017-05-13 NOTE — Patient Instructions (Signed)
Medication Instructions:  Your physician recommends that you continue on your current medications as directed. Please refer to the Current Medication list given to you today.  Labwork: None ordered.  Testing/Procedures: None ordered.  Follow-Up: Your physician recommends that you schedule a follow-up appointment in:   A RN visit on the week for 3/25 to have a BP check and compare her home BP machine to our BP readings.   One Year with [name]    Any Other Special Instructions Will Be Listed Below (If Applicable).     If you need a refill on your cardiac medications before your next appointment, please call your pharmacy.

## 2017-05-13 NOTE — Progress Notes (Signed)
Electrophysiology Office Note   Date:  05/13/2017   ID:  Joanne Bell, DOB 1960/10/16, MRN [id number]  PCP:  [name], MD  Cardiologist:    Primary Electrophysiologist: sk   No chief complaint on file.    History of Present Illness: Joanne Bell is a 57 y.o. female who presents today for electrophysiology  followup for a pacemaker implanted December 2012 for complete heart block and s/p CRT-D  Upgrade; device generator replacement 2017 Her workup for underlying causes was negative including  MRI  echo Lyme titers and chest x-ray these were all normal. EF was normal by MRI 12/12   She has hx hypertension.    DATE TEST EF   12/12 cMRI 68%   1/15 Echo   45 %   11/17 Echo   30-35 %   2/18 Echo  45-50%    The patient denies chest pain, shortness of breath, nocturnal dyspnea, orthopnea or peripheral edema.  There have been no palpitations, lightheadedness or syncope.  She says doing great  No device discharges     Past Medical History:  Diagnosis Date  . Atrioventricular block, complete (HCC)    narrow QRS  . Bilateral ovarian cysts   . 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 Chronic systolic CHF (congestive heart failure), NYHA class 3 ([place])   . Colon polyps   . Fibroids, intramural   . Hypertension   . NICM (nonischemic cardiomyopathy) ([place]) 07/20/2015   Past Surgical History:  Procedure Laterality Date  . COLONOSCOPY  2010   benign polyps, repeat in 5 yrs  . ENDOMETRIAL ABLATION  2004  . EP IMPLANTABLE DEVICE N/A 07/19/2015   Procedure: Upgrade to [place] ICD ;  Surgeon: Deboraha Sprang, MD;  Location: [facility];  Service: Cardiovascular;  Laterality: N/A;  . MYOMECTOMY  1998   per Dr. Garwin Brothers  . PERMANENT PACEMAKER INSERTION Left 02/16/2011   Procedure: PERMANENT PACEMAKER INSERTION;  Surgeon: Deboraha Sprang, MD;  Location: [facility] CATH LAB;  Service: Cardiovascular;  Laterality: Left;     Current Outpatient  Medications  Medication Sig Dispense Refill  . carvedilol (COREG) 25 MG tablet Take 1 tablet (25 mg total) by mouth 2 (two) times daily. 180 tablet 3  . ENTRESTO 97-103 MG TAKE 1 TABLET BY MOUTH 2 (TWO) TIMES DAILY. 60 tablet 8  . omeprazole (PRILOSEC) 40 MG capsule TAKE 1 CAPSULE (40 MG TOTAL) BY MOUTH DAILY. 90 capsule 3  . vitamin E 100 UNIT capsule Take 100 Units by mouth daily as needed (for supplementation).      Current Facility-Administered Medications  Medication Dose Route Frequency Provider Last Rate Last Dose  . ipratropium-albuterol (DUONEB) 0.5-2.5 (3) MG/3ML nebulizer solution 3 mL  3 mL Nebulization Once Dorothyann Peng, NP        Allergies:   Prednisone; Acyclovir and related; Augmentin [amoxicillin-pot clavulanate]; Clonidine derivatives; and Percocet [oxycodone-acetaminophen]   Social History:  The patient  reports that  has never smoked. she has never used smokeless tobacco. She reports that she does not drink alcohol or use drugs.   Family History:  [name] family history includes Arthritis in her father; Depression in her mother; Diabetes in her father; Hyperlipidemia in her father; Hypertension in her father; Stroke in her father.    ROS:  Please see the history of present illness.   Otherwise, review of systems is positive for noen.   All other systems are reviewed and negative.  PHYSICAL EXAM: VS:  BP (!) 160/90   Pulse 64   Ht 5\' 4"  (1.626 m)   Wt 164 lb (74.4 kg)   SpO2 98%   BMI 28.15 kg/m  , BMI Body mass index is 28.15 kg/m. Well developed and nourished in no acute distress HENT normal Neck supple with JVP-flat Clear Regular rate and rhythm, no murmurs or gallops Abd-soft with active BS No Clubbing cyanosis edema Skin-warm and dry A & Oriented  Grossly normal sensory and motor function   EKG: P-synchronous/ AV  pacing with Biv pacing configuration    Recent Labs: No results found for requested labs within last 8760 hours.    Lipid  Panel     Component Value Date/Time   CHOL 168 08/06/2010 1023   TRIG 160.0 (H) 08/06/2010 1023   HDL 47.20 08/06/2010 1023   CHOLHDL 4 08/06/2010 1023   VLDL 32.0 08/06/2010 1023   LDLCALC 89 08/06/2010 1023     Wt Readings from Last 3 Encounters:  05/13/17 164 lb (74.4 kg)  03/12/17 161 lb 12.8 oz (73.4 kg)  09/28/16 157 lb (71.2 kg)     Other studies Reviewed:     ASSESSMENT AND PLAN:  Complete heart block  Hypertension  sleep disordered breathing and daytime somnolence  Cardiomyopathy  CRT-D-Medtronic  [name] device was interrogated.  The information was reviewed.     There has been interval normalization of LV function.  Functional status is extremely good with class2a  Symptoms  Not clear what is going on with her blood pressure.  She is acutely ill sore with a cold.  She will check her blood pressure machine at home today.  If there is a significant discrepancy will schedule a nurse visit so we  verify accuracy of her machine  We spent more than 50% of our >25 min visit in face to face counseling regarding the above   Signed, [name], MD  05/13/2017 5:03 PM      [facility] [address] [place] [address] [phone] (office) [phone] (fax)

## 2017-06-01 ENCOUNTER — Encounter: Payer: Self-pay | Admitting: *Deleted

## 2017-06-01 ENCOUNTER — Ambulatory Visit (INDEPENDENT_AMBULATORY_CARE_PROVIDER_SITE_OTHER): Payer: BLUE CROSS/BLUE SHIELD | Admitting: *Deleted

## 2017-06-01 VITALS — BP 167/104 | HR 64 | Ht 64.0 in | Wt 163.0 lb

## 2017-06-01 DIAGNOSIS — I10 Essential (primary) hypertension: Secondary | ICD-10-CM | POA: Diagnosis not present

## 2017-06-01 NOTE — Patient Instructions (Signed)
Medication Instructions:   Your physician recommends that you continue on your current medications as directed. Please refer to the Current Medication list given to you today.    Follow-Up:  3 MONTHS IN EP CLINIC WITH RENEE URSUY PA-C OR DR. KLEIN       If you need a refill on your cardiac medications before your next appointment, please call your pharmacy.

## 2017-06-01 NOTE — Progress Notes (Signed)
1.) Reason for visit: Reason for visit: BP Follow-up with home monitor and manual office BP check per Dr Genice Rouge at last [place] on 05/13/17  2.) Name of MD requesting visit: Dr. [name]   3.) H&P: CHB, HTN, Cardiomyopathy, CRT-D-Medtronic  4.) ROS related to problem: Pt presents for nurse visit today for BP check--pt to have her home BP machine compared to our BP taken here in the office manually.  Pts BP at last OV with [name] was elevated at 160/90 HR-64.  Pts V/S today were:  RIGHT ARM HOME MONITOR 167/104, LEFT ARM MANUALLY TAKEN WAS 168/98 HR-64, R-18, SPO2 95 % RA.  Pt reports that her last 2 readings from home were WNL's with recorded readings on 3/24--138/83, 3/25-137/83.  Pt reports she is taking all meds prescribed, however, she forgot to take her carvedilol and Entresto this morning, and took both meds 1 hour prior to presenting to the office today for her BP check.  Pt states she takes these meds as scheduled at home.  Pt denies any cardiac symptoms today at her Nurse Visit.  Showed Dr. [name] the pts readings and he advised for her to continue her current med regimen and follow-up in 3 months in EP clinic.   5.) Assessment and plan per MD:   Per Dr. [name], pt is to continue her current medication regimen, continue monitoring her BP at home, and follow-up in 3 months in EP clinic.

## 2017-06-14 ENCOUNTER — Ambulatory Visit (INDEPENDENT_AMBULATORY_CARE_PROVIDER_SITE_OTHER): Payer: BLUE CROSS/BLUE SHIELD

## 2017-06-14 DIAGNOSIS — Z95810 Presence of automatic (implantable) cardiac defibrillator: Secondary | ICD-10-CM | POA: Diagnosis not present

## 2017-06-14 DIAGNOSIS — I5022 Chronic systolic (congestive) heart failure: Secondary | ICD-10-CM

## 2017-06-14 NOTE — Progress Notes (Signed)
EPIC Encounter for ICM Monitoring  Patient Name: Joanne Bell is a 57 y.o. female Date: 06/14/2017 Primary Care Physican: [name], MD Primary [place] Electrophysiologist: Faustino Congress Weight:unknown Bi-V Pacing: 97.2%       Attempted call to patient and unable to reach.  Left detailed message regarding transmission.  Transmission reviewed.    Thoracic impedance normal.  No diuretic  Recommendations: Left voice mail with ICM number and encouraged to call if experiencing any fluid symptoms.  Follow-up plan: ICM clinic phone appointment on 07/15/2017.  Office appointment scheduled 09/01/2017 with Dr. [name].  Copy of ICM check sent to Dr. [name].   3 month ICM trend: 06/14/2017    1 Year ICM trend:       [name], RN 06/14/2017 12:11 PM

## 2017-06-15 ENCOUNTER — Telehealth: Payer: Self-pay

## 2017-06-15 NOTE — Telephone Encounter (Signed)
Remote ICM transmission received.  Attempted call to patient and left detailed message per DPR regarding transmission and next ICM scheduled for 07/15/2017.  Advised to return call for any fluid symptoms or questions.    

## 2017-07-15 ENCOUNTER — Ambulatory Visit (INDEPENDENT_AMBULATORY_CARE_PROVIDER_SITE_OTHER): Payer: BLUE CROSS/BLUE SHIELD | Admitting: *Deleted

## 2017-07-15 DIAGNOSIS — I442 Atrioventricular block, complete: Secondary | ICD-10-CM

## 2017-07-15 DIAGNOSIS — Z95810 Presence of automatic (implantable) cardiac defibrillator: Secondary | ICD-10-CM

## 2017-07-15 DIAGNOSIS — I5022 Chronic systolic (congestive) heart failure: Secondary | ICD-10-CM | POA: Diagnosis not present

## 2017-07-15 NOTE — Progress Notes (Signed)
Remote ICD transmission.   

## 2017-07-15 NOTE — Progress Notes (Signed)
EPIC Encounter for ICM Monitoring  Patient Name: Joanne Bell is a 57 y.o. female Date: 07/15/2017 Primary Care Physican: [name], MD Primary [place] Electrophysiologist: Faustino Congress Weight:unknown Bi-V Pacing: 98.5%      Attempted call to patient and unable to reach.  Left detailed message, per DPR, regarding transmission.  Transmission reviewed.    Thoracic impedance normal.  No diuretic  Recommendations: Left voice mail with ICM number and encouraged to call if experiencing any fluid symptoms.  Follow-up plan: ICM clinic phone appointment on 08/16/2017.  Office appointment scheduled 09/01/2017 with Dr. [name].  Copy of ICM check sent to Dr. [name].   3 month ICM trend: 07/15/2017    1 Year ICM trend:       [name], RN 07/15/2017 4:52 PM

## 2017-07-20 ENCOUNTER — Encounter: Payer: Self-pay | Admitting: Cardiology

## 2017-08-05 ENCOUNTER — Ambulatory Visit: Payer: BLUE CROSS/BLUE SHIELD | Admitting: Family Medicine

## 2017-08-05 ENCOUNTER — Encounter: Payer: Self-pay | Admitting: Family Medicine

## 2017-08-05 VITALS — BP 160/88 | HR 60 | Temp 98.5°F | Ht 64.0 in | Wt 160.0 lb

## 2017-08-05 DIAGNOSIS — M7551 Bursitis of right shoulder: Secondary | ICD-10-CM

## 2017-08-05 MED ORDER — DICLOFENAC SODIUM 75 MG PO TBEC: 75.0000 mg | DELAYED_RELEASE_TABLET | Freq: Two times a day (BID) | ORAL | 2 refills | Status: DC

## 2017-08-05 NOTE — Progress Notes (Signed)
   Subjective:    Patient ID: Joanne Bell, female    DOB: 22-Sep-1960, 57 y.o.   MRN: [id number]  HPI Here for pain in the right shoulder and upper arm. No hx of trauma. This has been coming and going for the past year, but has become more frequent in the past month. Aleve helps it a little. She does not do much heavy lifting or repetitive motions with the arm.    Review of Systems  Constitutional: Negative.   Respiratory: Negative.   Cardiovascular: Negative.   Musculoskeletal: Positive for myalgias.       Objective:   Physical Exam  Constitutional: She appears well-developed and well-nourished.  Cardiovascular: Normal rate, regular rhythm, normal heart sounds and intact distal pulses.  Pulmonary/Chest: Effort normal and breath sounds normal.  Musculoskeletal:  Very tender over the right deltoid bursa. The shoulder has full ROM and no crepitus.           Assessment & Plan:  Deltoid bursitis. She will rest the arm and apply ice packs 2-3 times daily. Try Diclofenac bid. Recheck prn.  Alysia Penna, MD

## 2017-08-09 ENCOUNTER — Encounter: Payer: Self-pay | Admitting: Family Medicine

## 2017-08-09 ENCOUNTER — Ambulatory Visit: Payer: BLUE CROSS/BLUE SHIELD | Admitting: Family Medicine

## 2017-08-09 VITALS — BP 140/90 | HR 70 | Temp 98.5°F | Ht 64.0 in | Wt 159.8 lb

## 2017-08-09 DIAGNOSIS — M79601 Pain in right arm: Secondary | ICD-10-CM | POA: Diagnosis not present

## 2017-08-09 MED ORDER — HYDROCODONE-ACETAMINOPHEN 5-325 MG PO TABS: 1.0000 | ORAL_TABLET | Freq: Four times a day (QID) | ORAL | 0 refills | Status: DC | PRN

## 2017-08-09 NOTE — Progress Notes (Signed)
   Subjective:    Patient ID: Joanne Bell, female    DOB: 1961/01/24, 57 y.o.   MRN: [id number]  HPI Here to follow up right arm and shoulder pain. She was seen last week and was felt to have deltoid bursitis. She has been taking Diclofenac bid but the pain has not improved at all. The pain is centered over the right lateral shoulder but it radiates up the right side of the neck and all the way down the right arm to the hand. Now for 2 days she also has swelling in the right hand for the first time. No numbness or tingling in the arm or hand.   Review of Systems  Constitutional: Negative.   Respiratory: Negative.   Cardiovascular: Negative.   Musculoskeletal: Positive for arthralgias.  Neurological: Negative.        Objective:   Physical Exam  Constitutional: She is oriented to person, place, and time. She appears well-developed and well-nourished.  Neck: No thyromegaly present.  Cardiovascular: Normal rate, regular rhythm, normal heart sounds and intact distal pulses.  Pulmonary/Chest: Effort normal and breath sounds normal.  Musculoskeletal:  She is tender along the right side of the neck, across the right shoulder and down into the right upper arm. Most of the tenderness is on the lateral shoulder area. ROM of the shoulder is very limited due to severe pain. The right hand is a bit swollen, radial pulses are intact.   Lymphadenopathy:    She has no cervical adenopathy.  Neurological: She is alert and oriented to person, place, and time.          Assessment & Plan:  Worsening pain in the right arm and shoulder, now with hand swelling. The etiology is not clear. We will set up a venous doppler to rule out a DVT. Refer to Orthopedics to evaluate further. Use Norco for pain. She is written out of work from today until 08-12-17.  Alysia Penna, MD

## 2017-08-11 ENCOUNTER — Ambulatory Visit (HOSPITAL_COMMUNITY)
Admission: RE | Admit: 2017-08-11 | Discharge: 2017-08-11 | Disposition: A | Discharge status: Home / Self Care | Payer: BLUE CROSS/BLUE SHIELD | Source: Ambulatory Visit | Attending: Cardiology | Admitting: Cardiology

## 2017-08-11 DIAGNOSIS — M79601 Pain in right arm: Secondary | ICD-10-CM | POA: Insufficient documentation

## 2017-08-11 DIAGNOSIS — M7989 Other specified soft tissue disorders: Secondary | ICD-10-CM | POA: Insufficient documentation

## 2017-08-12 ENCOUNTER — Encounter (INDEPENDENT_AMBULATORY_CARE_PROVIDER_SITE_OTHER): Payer: Self-pay | Admitting: Orthopaedic Surgery

## 2017-08-12 ENCOUNTER — Ambulatory Visit (INDEPENDENT_AMBULATORY_CARE_PROVIDER_SITE_OTHER): Payer: Self-pay

## 2017-08-12 ENCOUNTER — Ambulatory Visit (INDEPENDENT_AMBULATORY_CARE_PROVIDER_SITE_OTHER): Payer: BLUE CROSS/BLUE SHIELD

## 2017-08-12 ENCOUNTER — Ambulatory Visit (INDEPENDENT_AMBULATORY_CARE_PROVIDER_SITE_OTHER): Payer: BLUE CROSS/BLUE SHIELD | Admitting: Orthopaedic Surgery

## 2017-08-12 DIAGNOSIS — M25512 Pain in left shoulder: Secondary | ICD-10-CM | POA: Diagnosis not present

## 2017-08-12 DIAGNOSIS — M542 Cervicalgia: Secondary | ICD-10-CM

## 2017-08-12 DIAGNOSIS — M25511 Pain in right shoulder: Secondary | ICD-10-CM

## 2017-08-12 LAB — CUP PACEART REMOTE DEVICE CHECK
Battery Remaining Longevity: 71 mo
Battery Voltage: 2.98 V
Brady Statistic RA Percent Paced: 5.45 %
Date Time Interrogation Session: 20190509092725
HIGH POWER IMPEDANCE MEASURED VALUE: 56 Ohm
Implantable Lead Implant Date: 20121210
Implantable Lead Location: 753858
Implantable Lead Location: 753860
Implantable Lead Model: 4598
Implantable Pulse Generator Implant Date: 20170512
Lead Channel Impedance Value: 418 Ohm
Lead Channel Impedance Value: 418 Ohm
Lead Channel Impedance Value: 608 Ohm
Lead Channel Impedance Value: 646 Ohm
Lead Channel Impedance Value: 760 Ohm
Lead Channel Impedance Value: 760 Ohm
Lead Channel Impedance Value: 779 Ohm
Lead Channel Pacing Threshold Amplitude: 0.5 V
Lead Channel Pacing Threshold Pulse Width: 0.4 ms
Lead Channel Pacing Threshold Pulse Width: 0.4 ms
Lead Channel Pacing Threshold Pulse Width: 0.8 ms
Lead Channel Sensing Intrinsic Amplitude: 0.875 mV
Lead Channel Sensing Intrinsic Amplitude: 3.125 mV
Lead Channel Sensing Intrinsic Amplitude: 3.125 mV
Lead Channel Setting Pacing Amplitude: 1.5 V
Lead Channel Setting Pacing Amplitude: 2 V
Lead Channel Setting Pacing Pulse Width: 0.8 ms
Lead Channel Setting Sensing Sensitivity: 0.3 mV
MDC IDC LEAD IMPLANT DT: 20170512
MDC IDC LEAD IMPLANT DT: 20170512
MDC IDC LEAD LOCATION: 753859
MDC IDC MSMT LEADCHNL LV IMPEDANCE VALUE: 361 Ohm
MDC IDC MSMT LEADCHNL LV IMPEDANCE VALUE: 399 Ohm
MDC IDC MSMT LEADCHNL LV IMPEDANCE VALUE: 399 Ohm
MDC IDC MSMT LEADCHNL LV IMPEDANCE VALUE: 418 Ohm
MDC IDC MSMT LEADCHNL LV IMPEDANCE VALUE: 513 Ohm
MDC IDC MSMT LEADCHNL LV PACING THRESHOLD AMPLITUDE: 0.875 V
MDC IDC MSMT LEADCHNL RV IMPEDANCE VALUE: 513 Ohm
MDC IDC MSMT LEADCHNL RV PACING THRESHOLD AMPLITUDE: 0.5 V
MDC IDC MSMT LEADCHNL RV SENSING INTR AMPL: 0.875 mV
MDC IDC SET LEADCHNL LV PACING AMPLITUDE: 2 V
MDC IDC SET LEADCHNL RV PACING PULSEWIDTH: 0.4 ms
MDC IDC STAT BRADY AP VP PERCENT: 5.46 %
MDC IDC STAT BRADY AP VS PERCENT: 0.01 %
MDC IDC STAT BRADY AS VP PERCENT: 94.53 %
MDC IDC STAT BRADY AS VS PERCENT: 0 %
MDC IDC STAT BRADY RV PERCENT PACED: 98.49 %

## 2017-08-12 MED ORDER — LIDOCAINE HCL 2 % IJ SOLN
2.0000 mL | INTRAMUSCULAR | Status: AC | PRN
  Administered 2017-08-12: 2 mL

## 2017-08-12 MED ORDER — BUPIVACAINE HCL 0.25 % IJ SOLN
2.0000 mL | INTRAMUSCULAR | Status: AC | PRN
  Administered 2017-08-12: 2 mL via INTRA_ARTICULAR

## 2017-08-12 MED ORDER — METHYLPREDNISOLONE ACETATE 40 MG/ML IJ SUSP
40.0000 mg | INTRAMUSCULAR | Status: AC | PRN
  Administered 2017-08-12: 40 mg via INTRA_ARTICULAR

## 2017-08-12 NOTE — Progress Notes (Signed)
Office Visit Note   Patient: Joanne Bell           Date of Birth: [birth year]           MRN: [id number] Visit Date: 08/12/2017              Requested by: [name], MD [place],  [address] PCP: [name], MD   Assessment & Plan: Visit Diagnoses:  1. Acute pain of right shoulder   2. Acute pain of left shoulder   3. Neck pain     Plan: Impression is right shoulder subacromial bursitis and adhesive capsulitis.  Today, we proceeded with a subacromial cortisone injection.  I will also send the patient to formal physical therapy.  She will follow-up with [place] in 6 weeks time for recheck.  Call with concerns or questions in the meantime.  Follow-Up Instructions: Return in about 6 weeks (around 09/23/2017).   Orders:  Orders Placed This Encounter  Procedures  . Large Joint Inj: R subacromial bursa  . XR Shoulder Left  . XR Shoulder Right  . XR Cervical Spine 2 or 3 views   No orders of the defined types were placed in this encounter.     Procedures: Large Joint Inj: R subacromial bursa on 08/12/2017 8:49 AM Indications: pain Details: 22 G needle Medications: 2 mL lidocaine 2 %; 2 mL bupivacaine 0.25 %; 40 mg methylPREDNISolone acetate 40 MG/ML Outcome: tolerated well, no immediate complications Patient was prepped and draped in the usual sterile fashion.       Clinical Data: No additional findings.   Subjective: Chief Complaint  Patient presents with  . Left Shoulder - Pain    HPI patient is a pleasant 57 year old female who presents to our clinic today with right shoulder pain.  This began approximately 3 weeks ago after extensive cleaning of her house.  The pain she has is to her deltoid radiating to the lateral side of her neck and occasionally into her scapula on the right.  She describes this as a constant pain worse with internal rotation, abduction and forward flexion.  She was seen by her primary care provider where she  was diagnosed with deltoid bursitis and given hydrocodone.  No numbness, tingling or burning.  She was also sent for Doppler ultrasound for swelling to the right hand which was negative.  No previous shoulder or cervical spine pathology.  Review of Systems as detailed in HPI.  All others reviewed are negative.   Objective: Vital Signs: There were no vitals taken for this visit.  Physical Exam well-developed well-nourished female no acute distress.  Alert and oriented x3.  Ortho Exam examination of the right shoulder reveals extremely limited motion in all planes secondary to pain.  No AC joint tenderness.  Minimal parascapular trigger point tenderness.  Specialty Comments:  No specialty comments available.  Imaging: Xr Cervical Spine 2 Or 3 Views  Result Date: 08/12/2017 Significant degenerative disc disease at C5-C6 and C6-C7 with significant anterior spurring  Xr Shoulder Right  Result Date: 08/12/2017 Type III acromion otherwise no structural abnormalities    PMFS History: Patient Active Problem List   Diagnosis Date Noted  . Acute pain of right shoulder 08/12/2017  . Neck pain 08/12/2017  . NICM (nonischemic cardiomyopathy) ([place]) 07/20/2015  . 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 Chronic systolic CHF (congestive heart failure), NYHA class 3 ([place]) 07/19/2015  . Syncope 04/01/2013  . Orthostatic hypotension 04/01/2013  .  Atrioventricular block, complete ([place])   . SPRAIN&STRAIN OTHER SPECIFIED SITES HIP&THIGH 01/03/2010  . ANEMIA-NOS 05/01/2009  . Essential hypertension 05/01/2009  . OVARIAN CYST 05/01/2009  . Keloid 05/01/2009   Past Medical History:  Diagnosis Date  . Atrioventricular block, complete (HCC)    narrow QRS  . Bilateral ovarian cysts   . 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 Chronic systolic CHF (congestive heart failure), NYHA class 3 ([place])   . Colon polyps   .  Fibroids, intramural   . Hypertension   . NICM (nonischemic cardiomyopathy) ([place]) 07/20/2015    Family History  Problem Relation Age of Onset  . Depression Mother   . Arthritis Father   . Diabetes Father   . Hyperlipidemia Father   . Hypertension Father   . Stroke Father     Past Surgical History:  Procedure Laterality Date  . COLONOSCOPY  2010   benign polyps, repeat in 5 yrs  . ENDOMETRIAL ABLATION  2004  . EP IMPLANTABLE DEVICE N/A 07/19/2015   Procedure: Upgrade to [place] ICD ;  Surgeon: Deboraha Sprang, MD;  Location: [place] [facility];  Service: Cardiovascular;  Laterality: N/A;  . MYOMECTOMY  1998   per Dr. Garwin Brothers  . PERMANENT PACEMAKER INSERTION Left 02/16/2011   Procedure: PERMANENT PACEMAKER INSERTION;  Surgeon: Deboraha Sprang, MD;  Location: [facility] CATH LAB;  Service: Cardiovascular;  Laterality: Left;   Social History   Occupational History    Employer: [employer]    Comment: Secretary  Tobacco Use  . Smoking status: Never Smoker  . Smokeless tobacco: Never Used  Substance and Sexual Activity  . Alcohol use: No    Alcohol/week: 0.0 oz  . Drug use: No  . Sexual activity: Not on file

## 2017-08-16 ENCOUNTER — Ambulatory Visit (INDEPENDENT_AMBULATORY_CARE_PROVIDER_SITE_OTHER): Payer: BLUE CROSS/BLUE SHIELD

## 2017-08-16 DIAGNOSIS — Z95810 Presence of automatic (implantable) cardiac defibrillator: Secondary | ICD-10-CM

## 2017-08-16 DIAGNOSIS — I5022 Chronic systolic (congestive) heart failure: Secondary | ICD-10-CM | POA: Diagnosis not present

## 2017-08-16 NOTE — Progress Notes (Signed)
EPIC Encounter for ICM Monitoring  Patient Name: Joanne Bell is a 57 y.o. female Date: 08/16/2017 Primary Care Physican: [name], MD Primary [place] Electrophysiologist: Faustino Congress Weight:unknown Bi-V Pacing: 97.4%        Attempted call to patient and unable to reach.  Left detailed message, per DPR, regarding transmission.  Transmission reviewed.    Thoracic impedance slightly below baseline normal.  No diuretic  Recommendations: Left voice mail with ICM number and encouraged to call if experiencing any fluid symptoms.  Follow-up plan: ICM clinic phone appointment on 10/14/2017.  Office appointment scheduled 09/01/2017 with Dr. [name].  Copy of ICM check sent to Dr. [name].   3 month ICM trend: 08/16/2017    1 Year ICM trend:       [name], RN 08/16/2017 12:41 PM

## 2017-08-17 ENCOUNTER — Telehealth: Payer: Self-pay

## 2017-08-17 NOTE — Telephone Encounter (Signed)
Remote ICM transmission received.  Attempted call to patient and left detailed message, per DPR, regarding transmission and next ICM scheduled for 10/14/2017.  Advised to return call for any fluid symptoms or questions.

## 2017-08-19 DIAGNOSIS — M629 Disorder of muscle, unspecified: Secondary | ICD-10-CM | POA: Diagnosis not present

## 2017-08-19 DIAGNOSIS — M25511 Pain in right shoulder: Secondary | ICD-10-CM | POA: Diagnosis not present

## 2017-08-19 DIAGNOSIS — R293 Abnormal posture: Secondary | ICD-10-CM | POA: Diagnosis not present

## 2017-08-24 DIAGNOSIS — R293 Abnormal posture: Secondary | ICD-10-CM | POA: Diagnosis not present

## 2017-08-24 DIAGNOSIS — M629 Disorder of muscle, unspecified: Secondary | ICD-10-CM | POA: Diagnosis not present

## 2017-08-24 DIAGNOSIS — M25511 Pain in right shoulder: Secondary | ICD-10-CM | POA: Diagnosis not present

## 2017-08-26 DIAGNOSIS — M25511 Pain in right shoulder: Secondary | ICD-10-CM | POA: Diagnosis not present

## 2017-08-26 DIAGNOSIS — M629 Disorder of muscle, unspecified: Secondary | ICD-10-CM | POA: Diagnosis not present

## 2017-08-26 DIAGNOSIS — R293 Abnormal posture: Secondary | ICD-10-CM | POA: Diagnosis not present

## 2017-09-01 ENCOUNTER — Encounter: Payer: BLUE CROSS/BLUE SHIELD | Admitting: Internal Medicine

## 2017-09-01 NOTE — Progress Notes (Deleted)
Electrophysiology Office Note   Date:  09/01/2017   ID:  Joanne Bell, DOB [birth year], MRN [id number]  PCP:  [name], MD  Cardiologist:    Primary Electrophysiologist: sk   No chief complaint on file.    History of Present Illness: Joanne Bell is a 57 y.o. female who presents today for electrophysiology  followup for a pacemaker implanted December 2012 for complete heart block.  She has hypertension Evaluation included an MRI, echo and Lyme titers as well as chest x-ray.  All were negative for primary cardiac disease.    She is s/p CRT-D upgrade at generator replacement 2017    She has hx hypertension.   Date Cr K Hgb  5/17 0.94 3.7 11.5  ***/*** *** *** ***     DATE TEST EF   12/12 cMRI 68%   1/15 Echo   45 %   11/17 Echo   30-35 %   2/18 Echo  45-50%    The patient denies chest pain, shortness of breath, nocturnal dyspnea, orthopnea or peripheral edema.  There have been no palpitations, lightheadedness or syncope.  She says doing great  No device discharges     Past Medical History:  Diagnosis Date  . Atrioventricular block, complete (HCC)    narrow QRS  . Bilateral ovarian cysts   . 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 Chronic systolic CHF (congestive heart failure), NYHA class 3 ([place])   . Colon polyps   . Fibroids, intramural   . Hypertension   . NICM (nonischemic cardiomyopathy) ([place]) 07/20/2015   Past Surgical History:  Procedure Laterality Date  . COLONOSCOPY  2010   benign polyps, repeat in 5 yrs  . ENDOMETRIAL ABLATION  2004  . EP IMPLANTABLE DEVICE N/A 07/19/2015   Procedure: Upgrade to [place] ICD ;  Surgeon: Deboraha Sprang, MD;  Location: [place] [facility];  Service: Cardiovascular;  Laterality: N/A;  . MYOMECTOMY  1998   per Dr. Garwin Brothers  . PERMANENT PACEMAKER INSERTION Left 02/16/2011   Procedure: PERMANENT PACEMAKER INSERTION;  Surgeon: Deboraha Sprang, MD;  Location[facility] C[facility] LAB;   Service: Cardiovascular;  Laterality: Left;     Current Outpatient Medications  Medication Sig Dispense Refill  . carvedilol (COREG) 25 MG tablet Take 1 tablet (25 mg total) by mouth 2 (two) times daily. 180 tablet 3  . diclofenac (VOLTAREN) 75 MG EC tablet Take 1 tablet (75 mg total) by mouth 2 (two) times daily. 60 tablet 2  . ENTRESTO 97-103 MG TAKE 1 TABLET BY MOUTH 2 (TWO) TIMES DAILY. 60 tablet 8  . HYDROcodone-acetaminophen (NORCO) 5-325 MG tablet Take 1 tablet by mouth every 6 (six) hours as needed for moderate pain. 20 tablet 0  . omeprazole (PRILOSEC) 40 MG capsule TAKE 1 CAPSULE (40 MG TOTAL) BY MOUTH DAILY. 90 capsule 3  . vitamin E 100 UNIT capsule Take 100 Units by mouth daily as needed (for supplementation).      Current Facility-Administered Medications  Medication Dose Route Frequency Provider Last Rate Last Dose  . ipratropium-albuterol (DUONEB) 0.5-2.5 (3) MG/3ML nebulizer solution 3 mL  3 mL Nebulization Once Dorothyann Peng, NP        Allergies:   Prednisone; Acyclovir and related; Augmentin [amoxicillin-pot clavulanate]; Clonidine derivatives; and Percocet [oxycodone-acetaminophen]   Social History:  The patient  reports that she has never smoked. She has never used smokeless tobacco. She reports that she does not drink alcohol or use drugs.  Family History:  [name] family history includes Arthritis in her father; Depression in her mother; Diabetes in her father; Hyperlipidemia in her father; Hypertension in her father; Stroke in her father.    ROS:  Please see the history of present illness.   Otherwise, review of systems is positive for noen.   All other systems are reviewed and negative.    PHYSICAL EXAM: VS:  There were no vitals taken for this visit. , BMI There is no height or weight on file to calculate BMI. ***   EKG:***   Recent Labs: No results found for requested labs within last 8760 hours.    Lipid Panel     Component Value Date/Time    CHOL 168 08/06/2010 1023   TRIG 160.0 (H) 08/06/2010 1023   HDL 47.20 08/06/2010 1023   CHOLHDL 4 08/06/2010 1023   VLDL 32.0 08/06/2010 1023   LDLCALC 89 08/06/2010 1023     Wt Readings from Last 3 Encounters:  08/09/17 159 lb 12.8 oz (72.5 kg)  08/05/17 160 lb (72.6 kg)  06/01/17 163 lb (73.9 kg)     Other studies Reviewed:     ASSESSMENT AND PLAN:  Complete heart block  Hypertension  Sleep disordered breathing and daytime somnolence  Cardiomyopathy interval normalization   CRT-D-Medtronic  [name] device was interrogated.  The information was reviewed.     There has been interval normalization of LV function.  Functional status is extremely good with class2a  Symptoms  Not clear what is going on with her blood pressure.  She is acutely ill sore with a cold.  She will check her blood pressure machine at home today.  If there is a significant discrepancy will schedule a nurse visit so we  verify accuracy of her machine  We spent more than 50% of our >25 min visit in face to face counseling regarding the above ***   Signed, [name], MD  09/01/2017 2:19 PM      [facility] [address] [place] [address] [phone] (office) [phone] (fax)

## 2017-09-20 ENCOUNTER — Ambulatory Visit: Payer: BLUE CROSS/BLUE SHIELD | Admitting: Family Medicine

## 2017-09-20 ENCOUNTER — Encounter: Payer: Self-pay | Admitting: Family Medicine

## 2017-09-20 VITALS — BP 112/78 | HR 85 | Temp 99.9°F | Ht 64.0 in | Wt 157.8 lb

## 2017-09-20 DIAGNOSIS — J019 Acute sinusitis, unspecified: Secondary | ICD-10-CM | POA: Diagnosis not present

## 2017-09-20 MED ORDER — NEOMYCIN-POLYMYXIN-HC 3.5-10000-1 OP SUSP: 4.0000 [drp] | Freq: Four times a day (QID) | OPHTHALMIC | 0 refills | Status: DC

## 2017-09-20 MED ORDER — AZITHROMYCIN 250 MG PO TABS: ORAL_TABLET | ORAL | 0 refills | Status: DC

## 2017-09-20 MED ORDER — HYDROCOD POLST-CPM POLST ER 10-8 MG/5ML PO SUER: 5.0000 mL | Freq: Two times a day (BID) | ORAL | 0 refills | Status: DC | PRN

## 2017-09-20 NOTE — Progress Notes (Signed)
   Subjective:    Patient ID: Joanne Bell, female    DOB: Mar 10, 1960, 57 y.o.   MRN: [id number]  HPI Here for one week of sinus congestion, PND, coughing up green sputum, and redness in the eyes with crusting.    Review of Systems  Constitutional: Positive for fever.  HENT: Positive for congestion, postnasal drip, sinus pressure and sinus pain. Negative for sore throat.   Eyes: Positive for discharge and redness.  Respiratory: Positive for cough.        Objective:   Physical Exam  Constitutional: She appears well-developed and well-nourished.  HENT:  Right Ear: External ear normal.  Left Ear: External ear normal.  Nose: Nose normal.  Mouth/Throat: Oropharynx is clear and moist.  Eyes:  Left conjunctiva is clear. Right conjunctiva is pink with yellow crusting along the lid   Neck: Neck supple. No thyromegaly present.  Pulmonary/Chest: Effort normal and breath sounds normal. No stridor. No respiratory distress. She has no wheezes. She has no rales.  Lymphadenopathy:    She has no cervical adenopathy.          Assessment & Plan:  Sinusitis with conjunctivitis. Treat with a Zpack and with Cortisporin drops.  Alysia Penna, MD

## 2017-09-21 ENCOUNTER — Telehealth: Payer: Self-pay

## 2017-09-21 ENCOUNTER — Ambulatory Visit: Payer: BLUE CROSS/BLUE SHIELD | Admitting: Family Medicine

## 2017-09-21 NOTE — Telephone Encounter (Signed)
Prior auth for generic Cortisporin eye drops sent to [email or web address]-key [id number].

## 2017-09-21 NOTE — Telephone Encounter (Signed)
Called the Pharmacy and verified that a PA is needed  Will send to Pricilla Holm to follow up

## 2017-09-21 NOTE — Telephone Encounter (Signed)
Copied from [place] [phone]. Topic: General - Other >> Sep 21, 2017  8:39 AM [name] R wrote: Pt called in and stated the eye drops that were called into the pharmacy on yesterday isnt being approved she states the pharmacy says Dr Sarajane Jews needs to [employer]. She doesn't know the name of drops she states that it isnt on her paperwork.

## 2017-09-22 ENCOUNTER — Telehealth: Payer: Self-pay | Admitting: *Deleted

## 2017-09-22 NOTE — Telephone Encounter (Signed)
Copied from [place] [phone]. Topic: General - Other >> Sep 22, 2017  9:03 AM Carolyn Stare wrote:  Pt cal lto ask if there is another eye drop that can be called in because she need to get back to work. The eye drop that RX for here is needing a PA

## 2017-09-22 NOTE — Telephone Encounter (Signed)
Sent to PCP to advise. If unsure what to send I will advise pt to call their insurance to find out what they will cover that is similar.

## 2017-09-22 NOTE — Telephone Encounter (Addendum)
Relation to pt: self Call back number: [phone]   Reason for call:  Patient checking on the status of PA, stating she spoke with [employer] stating PA was initiated incorrectly and form was re fax yesterday. Informed patient to contact [employer] and inquire what eye drops insurance will cover in the meantime.

## 2017-09-22 NOTE — Telephone Encounter (Signed)
I agree. If she can tell us what is covered, we will call it in

## 2017-09-23 ENCOUNTER — Telehealth: Payer: Self-pay | Admitting: Family Medicine

## 2017-09-23 NOTE — Telephone Encounter (Signed)
Called and spoke with pt. Pt stated that she will contact her insurance and will let us know what to send in.

## 2017-09-23 NOTE — Telephone Encounter (Signed)
Copied from [place] [phone]. Topic: Quick Communication - See Telephone Encounter >> Sep 23, 2017  8:43 AM Mylinda Latina, NT wrote: CRM for notification. See Telephone encounter for: 09/23/17. Larkin Ina calling from [place] states that the neomycin-polymyxin-hydrocortisone (CORTISPORIN) 3.5-10000-1 ophthalmic suspension was rejected at the pharamcy . But the prior auth was entered in for the Otic solution which doesn't required review. He is just needing clarification which medication is needed. Please call with this information CB# [phone] . He is going to fax over the request as well.

## 2017-09-23 NOTE — Telephone Encounter (Signed)
New PA for Cortisporin eye dropc initiated via CoverMyMeds as previous PA was submitted for ear drops. Awaiting determination. Key: [id number]  Per phone notes, patient is to contact insurance anyway to find out if there is a covered alternative that does not require PA, so will await either approval of PA or call back from patient.

## 2017-09-27 NOTE — Telephone Encounter (Signed)
PA received a canceled outcomes on CoverMyMeds. Called [employer] to follow-up and was told that all information has been received and they are reviewing PA and will make a determination today. They are using Cover My Meds key # [id number], which is the PA that was initially submitted for the ear drops, but they state they have changed it to the eye drops and they do not need any further info at this time.

## 2017-09-28 ENCOUNTER — Ambulatory Visit (INDEPENDENT_AMBULATORY_CARE_PROVIDER_SITE_OTHER): Payer: BLUE CROSS/BLUE SHIELD | Admitting: Orthopaedic Surgery

## 2017-09-28 NOTE — Telephone Encounter (Signed)
So still denied?

## 2017-09-28 NOTE — Telephone Encounter (Signed)
Yes, see documentation below.

## 2017-09-28 NOTE — Telephone Encounter (Signed)
Fax received from [facility] stating the request was denied and this was given to Dr Barbie Banner asst.

## 2017-09-28 NOTE — Telephone Encounter (Signed)
Sent to [place]. Were you able to call the pt's insurance yesterday?

## 2017-09-28 NOTE — Telephone Encounter (Signed)
Called pt and left a VM that the PA was denied. Asked for the pt to contact us if she is still having the same issues with her eye since it has been about a week now without medication. Asked pt to reach out to her insurance company if needed to find out what they are willing to cover.   Will keep and wait for pt to return call. CRM created and sent to [facility] pool.

## 2017-09-28 NOTE — Telephone Encounter (Signed)
Yes

## 2017-09-30 NOTE — Telephone Encounter (Signed)
Called pt to follow-up to see if she contacted her insurance company. No answer, will try to call again shortly.

## 2017-10-01 NOTE — Telephone Encounter (Signed)
Called pt and left a VM to call back.  

## 2017-10-01 NOTE — Telephone Encounter (Signed)
Called pt's cell and left a detailed VM for her to call me back.

## 2017-10-14 ENCOUNTER — Ambulatory Visit (INDEPENDENT_AMBULATORY_CARE_PROVIDER_SITE_OTHER): Payer: BLUE CROSS/BLUE SHIELD

## 2017-10-14 ENCOUNTER — Ambulatory Visit (INDEPENDENT_AMBULATORY_CARE_PROVIDER_SITE_OTHER): Payer: BLUE CROSS/BLUE SHIELD | Admitting: *Deleted

## 2017-10-14 DIAGNOSIS — Z95810 Presence of automatic (implantable) cardiac defibrillator: Secondary | ICD-10-CM

## 2017-10-14 DIAGNOSIS — I429 Cardiomyopathy, unspecified: Secondary | ICD-10-CM

## 2017-10-14 DIAGNOSIS — I442 Atrioventricular block, complete: Secondary | ICD-10-CM | POA: Diagnosis not present

## 2017-10-14 DIAGNOSIS — I5022 Chronic systolic (congestive) heart failure: Secondary | ICD-10-CM | POA: Diagnosis not present

## 2017-10-15 ENCOUNTER — Telehealth: Payer: Self-pay

## 2017-10-15 NOTE — Telephone Encounter (Signed)
Remote ICM transmission received.  Attempted call to patient and no answer   

## 2017-10-15 NOTE — Progress Notes (Signed)
Remote ICD transmission.   

## 2017-10-15 NOTE — Progress Notes (Signed)
EPIC Encounter for ICM Monitoring  Patient Name: Joanne Bell is a 57 y.o. female Date: 10/15/2017 Primary Care Physican: [name], MD Primary [place] Electrophysiologist: Faustino Congress Weight:unknown Bi-V Pacing: 99.8%      Attempted call to patient and unable to reach.  Left detailed message, per DPR, regarding transmission.  Transmission reviewed.    Thoracic impedance normal.  No diuretic  Recommendations:  NONE - Unable to reach.  Follow-up plan: ICM clinic phone appointment on 12/20/2017.   Office appointment scheduled 11/18/2017 with Dr. [name].    Copy of ICM check sent to Dr. [name].   3 month ICM trend: 10/14/2017    1 Year ICM trend:       [name], RN 10/15/2017 11:36 AM

## 2017-11-03 LAB — CUP PACEART REMOTE DEVICE CHECK
Battery Remaining Longevity: 67 mo
Brady Statistic AP VP Percent: 1.31 %
Brady Statistic AP VS Percent: 0.01 %
Brady Statistic AS VP Percent: 98.68 %
Brady Statistic AS VS Percent: 0.01 %
Date Time Interrogation Session: 20190808052402
HighPow Impedance: 56 Ohm
Implantable Lead Implant Date: 20121210
Implantable Lead Implant Date: 20170512
Implantable Lead Location: 753858
Implantable Lead Location: 753859
Implantable Lead Location: 753860
Lead Channel Impedance Value: 304 Ohm
Lead Channel Impedance Value: 342 Ohm
Lead Channel Impedance Value: 361 Ohm
Lead Channel Impedance Value: 399 Ohm
Lead Channel Impedance Value: 399 Ohm
Lead Channel Impedance Value: 418 Ohm
Lead Channel Impedance Value: 475 Ohm
Lead Channel Impedance Value: 608 Ohm
Lead Channel Impedance Value: 646 Ohm
Lead Channel Impedance Value: 665 Ohm
Lead Channel Pacing Threshold Amplitude: 0.5 V
Lead Channel Pacing Threshold Amplitude: 0.5 V
Lead Channel Pacing Threshold Amplitude: 0.875 V
Lead Channel Pacing Threshold Pulse Width: 0.4 ms
Lead Channel Pacing Threshold Pulse Width: 0.4 ms
Lead Channel Pacing Threshold Pulse Width: 0.8 ms
Lead Channel Sensing Intrinsic Amplitude: 0.875 mV
Lead Channel Sensing Intrinsic Amplitude: 3 mV
Lead Channel Setting Pacing Amplitude: 1.5 V
Lead Channel Setting Pacing Pulse Width: 0.4 ms
Lead Channel Setting Sensing Sensitivity: 0.3 mV
MDC IDC LEAD IMPLANT DT: 20170512
MDC IDC MSMT BATTERY VOLTAGE: 2.98 V
MDC IDC MSMT LEADCHNL LV IMPEDANCE VALUE: 589 Ohm
MDC IDC MSMT LEADCHNL LV IMPEDANCE VALUE: 589 Ohm
MDC IDC MSMT LEADCHNL RA SENSING INTR AMPL: 3 mV
MDC IDC MSMT LEADCHNL RV IMPEDANCE VALUE: 399 Ohm
MDC IDC MSMT LEADCHNL RV SENSING INTR AMPL: 0.875 mV
MDC IDC PG IMPLANT DT: 20170512
MDC IDC SET LEADCHNL LV PACING AMPLITUDE: 2 V
MDC IDC SET LEADCHNL LV PACING PULSEWIDTH: 0.8 ms
MDC IDC SET LEADCHNL RV PACING AMPLITUDE: 2 V
MDC IDC STAT BRADY RA PERCENT PACED: 1.32 %
MDC IDC STAT BRADY RV PERCENT PACED: 99.77 %

## 2017-11-05 ENCOUNTER — Telehealth: Payer: Self-pay | Admitting: Internal Medicine

## 2017-11-05 ENCOUNTER — Telehealth: Payer: Self-pay | Admitting: Pharmacist

## 2017-11-05 ENCOUNTER — Other Ambulatory Visit: Payer: Self-pay | Admitting: Internal Medicine

## 2017-11-05 MED ORDER — SACUBITRIL-VALSARTAN 97-103 MG PO TABS: 1.0000 | ORAL_TABLET | Freq: Two times a day (BID) | ORAL | 1 refills | Status: DC

## 2017-11-05 NOTE — Telephone Encounter (Signed)
Follow Up:   Patient returning a call back about  her medication she said it is for her BP. Please call .

## 2017-11-05 NOTE — Telephone Encounter (Signed)
New Message:        *STAT* If patient is at the pharmacy, call can be transferred to refill team.   1. Which medications need to be refilled? (please list name of each medication and dose if known) sacubitril-valsartan (ENTRESTO) 97-103 MG  2. Which pharmacy/location (including street and city if local pharmacy) is medication to be sent to?[id number] - [place], [place] - [address]  3. Do they need a 30 day or 90 day supply? [place]

## 2017-11-05 NOTE — Telephone Encounter (Signed)
Entresto added back to pt's medication profile. Per Dr [name] last OV, pt was to continue current medications which included Entresto.

## 2017-11-05 NOTE — Telephone Encounter (Signed)
Pt's medication was sent to pt's pharmacy as requested. Confirmation received.  °

## 2017-11-05 NOTE — Telephone Encounter (Signed)
Pt called into Coumadin clinic requesting refill of Entresto, which she says is prescribed by Dr. [name]. I do not see that we have ever prescribed her Entresto. Will route to Dr. [name] and his nurse to address.

## 2017-11-18 ENCOUNTER — Ambulatory Visit (INDEPENDENT_AMBULATORY_CARE_PROVIDER_SITE_OTHER): Payer: BLUE CROSS/BLUE SHIELD | Admitting: Internal Medicine

## 2017-11-18 ENCOUNTER — Encounter

## 2017-11-18 ENCOUNTER — Encounter: Payer: Self-pay | Admitting: Internal Medicine

## 2017-11-18 VITALS — BP 144/82 | HR 57 | Ht 64.0 in | Wt 157.0 lb

## 2017-11-18 DIAGNOSIS — I10 Essential (primary) hypertension: Secondary | ICD-10-CM

## 2017-11-18 DIAGNOSIS — I5022 Chronic systolic (congestive) heart failure: Secondary | ICD-10-CM | POA: Diagnosis not present

## 2017-11-18 DIAGNOSIS — Z95810 Presence of automatic (implantable) cardiac defibrillator: Secondary | ICD-10-CM | POA: Diagnosis not present

## 2017-11-18 DIAGNOSIS — I442 Atrioventricular block, complete: Secondary | ICD-10-CM | POA: Diagnosis not present

## 2017-11-18 DIAGNOSIS — I420 Dilated cardiomyopathy: Secondary | ICD-10-CM

## 2017-11-18 LAB — CUP PACEART INCLINIC DEVICE CHECK
Battery Remaining Longevity: 66 mo
Battery Voltage: 2.97 V
Brady Statistic AP VP Percent: 2.88 %
Brady Statistic AP VS Percent: 0.01 %
Brady Statistic AS VP Percent: 97.1 %
Brady Statistic RA Percent Paced: 2.88 %
Brady Statistic RV Percent Paced: 98.6 %
Date Time Interrogation Session: 20190912175122
HIGH POWER IMPEDANCE MEASURED VALUE: 55 Ohm
Implantable Lead Implant Date: 20121210
Implantable Lead Implant Date: 20170512
Implantable Lead Implant Date: 20170512
Implantable Lead Location: 753858
Implantable Lead Location: 753860
Implantable Lead Model: 4598
Lead Channel Impedance Value: 361 Ohm
Lead Channel Impedance Value: 399 Ohm
Lead Channel Impedance Value: 418 Ohm
Lead Channel Impedance Value: 418 Ohm
Lead Channel Impedance Value: 456 Ohm
Lead Channel Impedance Value: 456 Ohm
Lead Channel Impedance Value: 532 Ohm
Lead Channel Impedance Value: 646 Ohm
Lead Channel Impedance Value: 665 Ohm
Lead Channel Impedance Value: 760 Ohm
Lead Channel Pacing Threshold Amplitude: 0.5 V
Lead Channel Pacing Threshold Amplitude: 0.5 V
Lead Channel Pacing Threshold Pulse Width: 0.4 ms
Lead Channel Pacing Threshold Pulse Width: 0.4 ms
Lead Channel Pacing Threshold Pulse Width: 0.8 ms
Lead Channel Setting Pacing Amplitude: 1.5 V
Lead Channel Setting Pacing Amplitude: 2 V
Lead Channel Setting Pacing Pulse Width: 0.4 ms
Lead Channel Setting Pacing Pulse Width: 0.8 ms
Lead Channel Setting Sensing Sensitivity: 0.3 mV
MDC IDC LEAD LOCATION: 753859
MDC IDC MSMT LEADCHNL LV IMPEDANCE VALUE: 418 Ohm
MDC IDC MSMT LEADCHNL LV IMPEDANCE VALUE: 722 Ohm
MDC IDC MSMT LEADCHNL LV IMPEDANCE VALUE: 779 Ohm
MDC IDC MSMT LEADCHNL LV PACING THRESHOLD AMPLITUDE: 1 V
MDC IDC MSMT LEADCHNL RA SENSING INTR AMPL: 2.875 mV
MDC IDC PG IMPLANT DT: 20170512
MDC IDC SET LEADCHNL LV PACING AMPLITUDE: 2 V
MDC IDC STAT BRADY AS VS PERCENT: 0 %

## 2017-11-18 NOTE — Progress Notes (Signed)
Electrophysiology Office Note   Date:  11/18/2017   ID:  Joanne Bell, DOB [birth year], MRN [id number]  PCP:  [name], MD  Cardiologist:    Primary Electrophysiologist: sk   No chief complaint on file.    History of Present Illness: Joanne Bell is a 57 y.o. female who presents today for electrophysiology  followup for a pacemaker implanted December 2012 for complete heart block and s/p CRT-D  Upgrade; device generator replacement 2017 Her workup for underlying causes was negative including  MRI  echo Lyme titers and chest x-ray these were all normal. EF was normal by MRI 12/12   She has hx hypertension.   Date Cr K  5/17 0.94 3.7          DATE TEST EF   12/12 cMRI 68%   1/15 Echo   45 %   11/17 Echo   30-35 %   2/18 Echo  45-50%    The patient denies chest pain, shortness of breath, nocturnal dyspnea, orthopnea or peripheral edema.  There have been no palpitations, lightheadedness or syncope.    Past Medical History:  Diagnosis Date  . Atrioventricular block, complete (HCC)    narrow QRS  . Bilateral ovarian cysts   . 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 Chronic systolic CHF (congestive heart failure), NYHA class 3 ([place])   . Colon polyps   . Fibroids, intramural   . Hypertension   . NICM (nonischemic cardiomyopathy) ([place]) 07/20/2015   Past Surgical History:  Procedure Laterality Date  . COLONOSCOPY  2010   benign polyps, repeat in 5 yrs  . ENDOMETRIAL ABLATION  2004  . EP IMPLANTABLE DEVICE N/A 07/19/2015   Procedure: Upgrade to [place] ICD ;  Surgeon: Deboraha Sprang, MD;  Location: [facility] CV LAB;  Service: Cardiovascular;  Laterality: N/A;  . MYOMECTOMY  1998   per Dr. Garwin Brothers  . PERMANENT PACEMAKER INSERTION Left 02/16/2011   Procedure: PERMANENT PACEMAKER INSERTION;  Surgeon: Deboraha Sprang, MD;  Location: [facility] CATH LAB;  Service: Cardiovascular;  Laterality: Left;     Current Outpatient  Medications  Medication Sig Dispense Refill  . carvedilol (COREG) 25 MG tablet Take 1 tablet (25 mg total) by mouth 2 (two) times daily. 180 tablet 3  . omeprazole (PRILOSEC) 40 MG capsule TAKE 1 CAPSULE (40 MG TOTAL) BY MOUTH DAILY. 90 capsule 3  . sacubitril-valsartan (ENTRESTO) 97-103 MG Take 1 tablet by mouth 2 (two) times daily. 180 tablet 1  . vitamin E 100 UNIT capsule Take 100 Units by mouth daily as needed (for supplementation).      No current facility-administered medications for this visit.     Allergies:   Prednisone; Acyclovir and related; Augmentin [amoxicillin-pot clavulanate]; Clonidine derivatives; and Percocet [oxycodone-acetaminophen]   Social History:  The patient  reports that she has never smoked. She has never used smokeless tobacco. She reports that she does not drink alcohol or use drugs.   Family History:  [name] family history includes Arthritis in her father; Depression in her mother; Diabetes in her father; Hyperlipidemia in her father; Hypertension in her father; Stroke in her father.    ROS:  Please see the history of present illness.   Otherwise, review of systems is positive for noen.   All other systems are reviewed and negative.    PHYSICAL EXAM: VS:  BP (!) 144/82   Pulse (!) 57   Ht 5\' 4"  (1.626 m)   Wt  157 lb (71.2 kg)   BMI 26.95 kg/m  , BMI Body mass index is 26.95 kg/m. Well developed and nourished in no acute distress HENT normal Neck supple with JVP-flat Clear Regular rate and rhythm, no murmurs or gallops Abd-soft with active BS No Clubbing cyanosis edema Skin-warm and dry A & Oriented  Grossly normal sensory and motor function Device pocket well healed; without hematoma or erythema.  There is no tethering    EKG: P-synchronous/ AV  pacing    Recent Labs: No results found for requested labs within last 8760 hours.    Lipid Panel     Component Value Date/Time   CHOL 168 08/06/2010 1023   TRIG 160.0 (H) 08/06/2010  1023   HDL 47.20 08/06/2010 1023   CHOLHDL 4 08/06/2010 1023   VLDL 32.0 08/06/2010 1023   LDLCALC 89 08/06/2010 1023     Wt Readings from Last 3 Encounters:  11/18/17 157 lb (71.2 kg)  09/20/17 157 lb 12.8 oz (71.6 kg)  08/09/17 159 lb 12.8 oz (72.5 kg)     Other studies Reviewed:     ASSESSMENT AND PLAN:  Complete heart block  Hypertension  sleep disordered breathing and daytime somnolence  Cardiomyopathy  CRT-D-Medtronic  [name] device was interrogated.  The information was reviewed.     Euvolemic continue current meds  We will check surveillance labs and echo  Continue ARB and BB   Signed, [name], MD  11/18/2017 4:23 PM      [facility] [address] [place] [address] [phone] (office) [phone] (fax)

## 2017-11-18 NOTE — Patient Instructions (Signed)
Medication Instructions:  Your physician recommends that you continue on your current medications as directed. Please refer to the Current Medication list given to you today.  Labwork: You will have labs drawn today: CBC, BMP  Testing/Procedures: Your physician has requested that you have an echocardiogram. Echocardiography is a painless test that uses sound waves to create images of your heart. It provides your doctor with information about the size and shape of your heart and how well your heart's chambers and valves are working. This procedure takes approximately one hour. There are no restrictions for this procedure.   Follow-Up: Your physician recommends that you schedule a follow-up appointment in:  6 months with an EP APP   Any Other Special Instructions Will Be Listed Below (If Applicable).     If you need a refill on your cardiac medications before your next appointment, please call your pharmacy.

## 2017-11-19 LAB — BASIC METABOLIC PANEL
BUN / CREAT RATIO: 12 (ref 9–23)
BUN: 10 mg/dL (ref 6–24)
CO2: 27 mmol/L (ref 20–29)
Calcium: 9.6 mg/dL (ref 8.7–10.2)
Chloride: 99 mmol/L (ref 96–106)
Creatinine, Ser: 0.84 mg/dL (ref 0.57–1.00)
GFR, EST AFRICAN AMERICAN: 88 mL/min/{1.73_m2} (ref >59)
GFR, EST NON AFRICAN AMERICAN: 76 mL/min/{1.73_m2} (ref >59)
GLUCOSE: 89 mg/dL (ref 65–99)
Potassium: 4.3 mmol/L (ref 3.5–5.2)
Sodium: 140 mmol/L (ref 134–144)

## 2017-11-19 LAB — CBC
HEMATOCRIT: 35.5 % (ref 34.0–46.6)
HEMOGLOBIN: 11 g/dL — AB (ref 11.1–15.9)
MCH: 26.1 pg — ABNORMAL LOW (ref 26.6–33.0)
MCHC: 31 g/dL — AB (ref 31.5–35.7)
MCV: 84 fL (ref 79–97)
Platelets: 293 10*3/uL (ref 150–450)
RBC: 4.21 x10E6/uL (ref 3.77–5.28)
RDW: 14.4 % (ref 12.3–15.4)
WBC: 5.8 10*3/uL (ref 3.4–10.8)

## 2017-11-23 ENCOUNTER — Other Ambulatory Visit: Payer: Self-pay

## 2017-11-23 ENCOUNTER — Ambulatory Visit (HOSPITAL_COMMUNITY): Payer: BLUE CROSS/BLUE SHIELD | Attending: Cardiology

## 2017-11-23 DIAGNOSIS — I442 Atrioventricular block, complete: Secondary | ICD-10-CM | POA: Diagnosis not present

## 2017-11-23 DIAGNOSIS — Z95810 Presence of automatic (implantable) cardiac defibrillator: Secondary | ICD-10-CM | POA: Insufficient documentation

## 2017-11-23 DIAGNOSIS — I951 Orthostatic hypotension: Secondary | ICD-10-CM | POA: Insufficient documentation

## 2017-11-23 DIAGNOSIS — I110 Hypertensive heart disease with heart failure: Secondary | ICD-10-CM | POA: Insufficient documentation

## 2017-11-23 DIAGNOSIS — I10 Essential (primary) hypertension: Secondary | ICD-10-CM

## 2017-11-23 DIAGNOSIS — I5022 Chronic systolic (congestive) heart failure: Secondary | ICD-10-CM | POA: Insufficient documentation

## 2017-11-23 DIAGNOSIS — I420 Dilated cardiomyopathy: Secondary | ICD-10-CM | POA: Diagnosis not present

## 2017-11-30 NOTE — Telephone Encounter (Signed)
Do not need this encounter °

## 2017-12-20 ENCOUNTER — Ambulatory Visit (INDEPENDENT_AMBULATORY_CARE_PROVIDER_SITE_OTHER): Payer: BLUE CROSS/BLUE SHIELD

## 2017-12-20 DIAGNOSIS — Z95810 Presence of automatic (implantable) cardiac defibrillator: Secondary | ICD-10-CM | POA: Diagnosis not present

## 2017-12-20 DIAGNOSIS — I5022 Chronic systolic (congestive) heart failure: Secondary | ICD-10-CM

## 2017-12-21 ENCOUNTER — Telehealth: Payer: Self-pay

## 2017-12-21 NOTE — Telephone Encounter (Signed)
Remote ICM transmission received.  Attempted call to patient and left detailed message, per DPR, regarding transmission and next ICM scheduled for 01/20/2018.  Advised to return call for any fluid symptoms or questions.

## 2017-12-21 NOTE — Progress Notes (Signed)
EPIC Encounter for ICM Monitoring  Patient Name: Joanne Bell is a 57 y.o. female Date: 12/21/2017 Primary Care Physican: [name], MD Primary [place] Electrophysiologist: Faustino Congress Weight:unknown Bi-V Pacing: 99.7%             Attempted call to patient and unable to reach.  Left detailed message, per DPR, regarding transmission.  Transmission reviewed.    Thoracic impedance normal.   Prescribed: No diuretic  Recommendations: Left voice mail with ICM number and encouraged to call if experiencing any fluid symptoms.  Follow-up plan: ICM clinic phone appointment on 01/20/2018.    Copy of ICM check sent to Dr. [name].   3 month ICM trend: 12/20/2017    1 Year ICM trend:       [name], RN 12/21/2017 12:55 PM

## 2018-01-20 ENCOUNTER — Ambulatory Visit (INDEPENDENT_AMBULATORY_CARE_PROVIDER_SITE_OTHER): Payer: BLUE CROSS/BLUE SHIELD

## 2018-01-20 ENCOUNTER — Ambulatory Visit (INDEPENDENT_AMBULATORY_CARE_PROVIDER_SITE_OTHER): Payer: BLUE CROSS/BLUE SHIELD | Admitting: *Deleted

## 2018-01-20 DIAGNOSIS — Z95810 Presence of automatic (implantable) cardiac defibrillator: Secondary | ICD-10-CM

## 2018-01-20 DIAGNOSIS — I5022 Chronic systolic (congestive) heart failure: Secondary | ICD-10-CM

## 2018-01-20 DIAGNOSIS — I442 Atrioventricular block, complete: Secondary | ICD-10-CM

## 2018-01-20 NOTE — Progress Notes (Signed)
Remote ICD transmission.   

## 2018-01-21 ENCOUNTER — Telehealth: Payer: Self-pay

## 2018-01-21 NOTE — Telephone Encounter (Signed)
Remote ICM transmission received.  Attempted call to patient regarding ICM remote transmission and left detailed message, per DPR, with next ICM remote transmission date of 02/21/2018.  Advised to return call for any fluid symptoms or questions.   

## 2018-01-21 NOTE — Progress Notes (Signed)
EPIC Encounter for ICM Monitoring  Patient Name: Joanne Bell is a 57 y.o. female Date: 01/21/2018 Primary Care Physican: [name], MD Primary [place] Electrophysiologist: Faustino Congress Weight:unknown Bi-V Pacing: 99.7%                                                      Attempted call to patient and unable to reach.  Left detailed message, per DPR, regarding transmission.  Transmission reviewed.    Thoracic impedance normal.   Prescribed: No diuretic  Recommendations: Left voice mail with ICM number and encouraged to call if experiencing any fluid symptoms.  Follow-up plan: ICM clinic phone appointment on 02/21/2018.    Copy of ICM check sent to Dr. [name].    3 month ICM trend: 01/20/2018    1 Year ICM trend:       [name], RN 01/21/2018 3:57 PM

## 2018-02-21 ENCOUNTER — Ambulatory Visit (INDEPENDENT_AMBULATORY_CARE_PROVIDER_SITE_OTHER): Payer: BLUE CROSS/BLUE SHIELD

## 2018-02-21 DIAGNOSIS — Z95810 Presence of automatic (implantable) cardiac defibrillator: Secondary | ICD-10-CM | POA: Diagnosis not present

## 2018-02-21 DIAGNOSIS — I5022 Chronic systolic (congestive) heart failure: Secondary | ICD-10-CM

## 2018-02-22 NOTE — Progress Notes (Signed)
EPIC Encounter for ICM Monitoring  Patient Name: Joanne Bell is a 57 y.o. female Date: 02/22/2018 Primary Care Physican: [name], MD Primary [place] Electrophysiologist: Vergie Living Pacing: 99.8% Last weight: unknown  Transmission reviewed.   Thoracic impedance normal.  Prescribed:No diuretic  Recommendations:Left voice mail with ICM number and encouraged to call if experiencing any fluid symptoms.  Follow-up plan: ICM clinic phone appointment on1/20/2020.   Copy of ICM check sent to [place]  3 month ICM trend: 02/21/2018    1 Year ICM trend:       [name], RN 02/22/2018 2:33 PM

## 2018-03-16 ENCOUNTER — Ambulatory Visit: Payer: BLUE CROSS/BLUE SHIELD | Admitting: Family Medicine

## 2018-03-21 LAB — CUP PACEART REMOTE DEVICE CHECK
Battery Remaining Longevity: 60 mo
Battery Voltage: 2.98 V
Brady Statistic AS VP Percent: 97.77 %
Brady Statistic AS VS Percent: 0 %
Brady Statistic RV Percent Paced: 99.67 %
HighPow Impedance: 55 Ohm
Implantable Lead Implant Date: 20121210
Implantable Lead Implant Date: 20170512
Implantable Lead Location: 753858
Implantable Lead Location: 753859
Implantable Lead Model: 4598
Implantable Pulse Generator Implant Date: 20170512
Lead Channel Impedance Value: 399 Ohm
Lead Channel Impedance Value: 399 Ohm
Lead Channel Impedance Value: 456 Ohm
Lead Channel Impedance Value: 475 Ohm
Lead Channel Impedance Value: 646 Ohm
Lead Channel Impedance Value: 665 Ohm
Lead Channel Pacing Threshold Amplitude: 0.625 V
Lead Channel Pacing Threshold Amplitude: 0.875 V
Lead Channel Pacing Threshold Pulse Width: 0.4 ms
Lead Channel Sensing Intrinsic Amplitude: 0.875 mV
Lead Channel Sensing Intrinsic Amplitude: 3.25 mV
Lead Channel Sensing Intrinsic Amplitude: 3.25 mV
Lead Channel Setting Pacing Amplitude: 2 V
Lead Channel Setting Pacing Pulse Width: 0.4 ms
MDC IDC LEAD IMPLANT DT: 20170512
MDC IDC LEAD LOCATION: 753860
MDC IDC MSMT LEADCHNL LV IMPEDANCE VALUE: 361 Ohm
MDC IDC MSMT LEADCHNL LV IMPEDANCE VALUE: 361 Ohm
MDC IDC MSMT LEADCHNL LV IMPEDANCE VALUE: 399 Ohm
MDC IDC MSMT LEADCHNL LV IMPEDANCE VALUE: 665 Ohm
MDC IDC MSMT LEADCHNL LV IMPEDANCE VALUE: 703 Ohm
MDC IDC MSMT LEADCHNL LV IMPEDANCE VALUE: 722 Ohm
MDC IDC MSMT LEADCHNL LV PACING THRESHOLD PULSEWIDTH: 0.8 ms
MDC IDC MSMT LEADCHNL RA PACING THRESHOLD AMPLITUDE: 0.5 V
MDC IDC MSMT LEADCHNL RA PACING THRESHOLD PULSEWIDTH: 0.4 ms
MDC IDC MSMT LEADCHNL RV IMPEDANCE VALUE: 418 Ohm
MDC IDC MSMT LEADCHNL RV SENSING INTR AMPL: 0.875 mV
MDC IDC SESS DTM: 20191114062302
MDC IDC SET LEADCHNL LV PACING PULSEWIDTH: 0.8 ms
MDC IDC SET LEADCHNL RA PACING AMPLITUDE: 1.5 V
MDC IDC SET LEADCHNL RV PACING AMPLITUDE: 2 V
MDC IDC SET LEADCHNL RV SENSING SENSITIVITY: 0.3 mV
MDC IDC STAT BRADY AP VP PERCENT: 2.22 %
MDC IDC STAT BRADY AP VS PERCENT: 0.01 %
MDC IDC STAT BRADY RA PERCENT PACED: 2.23 %

## 2018-03-28 ENCOUNTER — Ambulatory Visit (INDEPENDENT_AMBULATORY_CARE_PROVIDER_SITE_OTHER): Payer: BLUE CROSS/BLUE SHIELD

## 2018-03-28 DIAGNOSIS — Z95810 Presence of automatic (implantable) cardiac defibrillator: Secondary | ICD-10-CM | POA: Diagnosis not present

## 2018-03-28 DIAGNOSIS — I5022 Chronic systolic (congestive) heart failure: Secondary | ICD-10-CM | POA: Diagnosis not present

## 2018-03-29 ENCOUNTER — Telehealth: Payer: Self-pay

## 2018-03-29 NOTE — Telephone Encounter (Signed)
Remote ICM transmission received.  Attempted call to patient regarding ICM remote transmission and left detailed message, per DPR, with next ICM remote transmission date of 05/02/2018.  Advised to return call for any fluid symptoms or questions.   

## 2018-03-29 NOTE — Progress Notes (Signed)
EPIC Encounter for ICM Monitoring  Patient Name: Joanne Bell is a 58 y.o. female Date: 03/29/2018 Primary Care Physican: [name], MD Primary [employer] Electrophysiologist: Vergie Living Pacing: 95.6% Last weight: unknown  Attempted call to patient.  Left detailed message per DPR regarding transmission. Transmission reviewed.   Thoracic impedance normal.  Prescribed:No diuretic  Recommendations:Left voice mail with ICM number and encouraged to call if experiencing any fluid symptoms.  Follow-up plan: ICM clinic phone appointment on1/20/2020.   Copy of ICM check sent to [place]  3 month ICM trend: 03/28/2018    1 Year ICM trend:       [name], RN 03/29/2018 3:02 PM

## 2018-04-04 ENCOUNTER — Telehealth: Payer: Self-pay | Admitting: Internal Medicine

## 2018-04-04 NOTE — Telephone Encounter (Signed)
Called pt. She states this weekend she had low Bps so she began taking her Entresto once daily vs bid. Pt feels she needs to see [name] for medication and symptom mgt. She has decided to take a 2/3 appt date. Pt agrees to take Entresto, half tablet bid vs whole tablet qd. Pt understands if she becomes symptomatic with hypotension once again, she should seek help at the ED and not wait to be seen in the office. Pt has verbalized understanding and had no additional questions.

## 2018-04-04 NOTE — Telephone Encounter (Signed)
New message      Pt c/o BP issue: STAT if pt c/o blurred vision, one-sided weakness or slurred speech  1. What are your last 5 BP readings? 77/6? And pulse 44 last thurs or fri 2. Are you having any other symptoms (ex. Dizziness, headache, blurred vision, passed out)?  Sob on exertion for approx 2 weeks 3. What is your BP issue? Pt walked in to schedule an appt with [name].  I offered her feb 3rd but pt stated that she wanted to be seen sooner. Please call pt

## 2018-04-11 ENCOUNTER — Encounter: Payer: Self-pay | Admitting: Internal Medicine

## 2018-04-11 ENCOUNTER — Ambulatory Visit: Payer: BLUE CROSS/BLUE SHIELD | Admitting: Internal Medicine

## 2018-04-11 VITALS — BP 138/82 | HR 61 | Ht 64.0 in | Wt 158.4 lb

## 2018-04-11 DIAGNOSIS — I5022 Chronic systolic (congestive) heart failure: Secondary | ICD-10-CM | POA: Diagnosis not present

## 2018-04-11 DIAGNOSIS — Z95810 Presence of automatic (implantable) cardiac defibrillator: Secondary | ICD-10-CM

## 2018-04-11 DIAGNOSIS — I420 Dilated cardiomyopathy: Secondary | ICD-10-CM

## 2018-04-11 DIAGNOSIS — I442 Atrioventricular block, complete: Secondary | ICD-10-CM | POA: Diagnosis not present

## 2018-04-11 NOTE — Progress Notes (Signed)
Electrophysiology Office Note   Date:  04/11/2018   ID:  Deniyah, Dillavou 1960-05-18, MRN [id number]  PCP:  [name], MD  Cardiologist:    Primary Electrophysiologist: sk   No chief complaint on file.    History of Present Illness: EDDY TERMINE is a 58 y.o. female who presents today for electrophysiology  followup for a pacemaker implanted December 2012 for complete heart block and s/p CRT-D  Upgrade; device generator replacement 2017 Her workup for underlying causes was negative including  MRI  echo Lyme titers and chest x-ray these were all normal. EF was normal by MRI 12/12   She has hx hypertension.    Date Cr K Hgb  5/17 0.94 3.7 11.5   9/19 0.84 4.3 11.0     DATE TEST EF   12/12 cMRI 68%   1/15 Echo   45 %   11/17 Echo   30-35 %   2/18 Echo  45-50%   9/19.  Echo  55%    The patient denies chest pain, shortness of breath, nocturnal dyspnea, orthopnea or peripheral edema.  There have been no palpitations, lightheadedness or syncope.   She is here today because she went on a church fast the beginning of January and her salt intake diminished significantly and she started ending up with dizziness and weakness.  She noted her blood pressures were in the 70s and she on her own changed her Entresto to half tablet twice daily with resolution of her symptoms.  She now feels great and is sleeping great.  Past Medical History:  Diagnosis Date  . Atrioventricular block, complete (HCC)    narrow QRS  . Bilateral ovarian cysts   . 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 Chronic systolic CHF (congestive heart failure), NYHA class 3 ([place])   . Colon polyps   . Fibroids, intramural   . Hypertension   . NICM (nonischemic cardiomyopathy) ([place]) 07/20/2015   Past Surgical History:  Procedure Laterality Date  . COLONOSCOPY  2010   benign polyps, repeat in 5 yrs  . ENDOMETRIAL ABLATION  2004  . EP IMPLANTABLE DEVICE N/A  07/19/2015   Procedure: Upgrade to [place] ICD ;  Surgeon: Deboraha Sprang, MD;  Location: [facility] CV LAB;  Service: Cardiovascular;  Laterality: N/A;  . MYOMECTOMY  1998   per Dr. Garwin Brothers  . PERMANENT PACEMAKER INSERTION Left 02/16/2011   Procedure: PERMANENT PACEMAKER INSERTION;  Surgeon: Deboraha Sprang, MD;  Location: [facility] CATH LAB;  Service: Cardiovascular;  Laterality: Left;     Current Outpatient Medications  Medication Sig Dispense Refill  . carvedilol (COREG) 25 MG tablet Take 1 tablet (25 mg total) by mouth 2 (two) times daily. 180 tablet 3  . omeprazole (PRILOSEC) 40 MG capsule TAKE 1 CAPSULE (40 MG TOTAL) BY MOUTH DAILY. 90 capsule 3  . sacubitril-valsartan (ENTRESTO) 97-103 MG Take 1 tablet by mouth 2 (two) times daily. 180 tablet 1  . vitamin E 100 UNIT capsule Take 100 Units by mouth daily as needed (for supplementation).      No current facility-administered medications for this visit.     Allergies:   Prednisone; Acyclovir and related; Augmentin [amoxicillin-pot clavulanate]; Clonidine derivatives; and Percocet [oxycodone-acetaminophen]   Social History:  The patient  reports that she has never smoked. She has never used smokeless tobacco. She reports that she does not drink alcohol or use drugs.   Family History:  [name] family history includes Arthritis in  her father; Depression in her mother; Diabetes in her father; Hyperlipidemia in her father; Hypertension in her father; Stroke in her father.    ROS:  Please see the history of present illness.   Otherwise, review of systems is positive for noen.   All other systems are reviewed and negative.    PHYSICAL EXAM: VS:  BP 138/82   Pulse 61   Ht 5\' 4"  (1.626 m)   Wt 158 lb 6.4 oz (71.8 kg)   SpO2 97%   BMI 27.19 kg/m  , BMI Body mass index is 27.19 kg/m.   Well developed and nourished in no acute distress HENT normal Neck supple with JVP-flat Clear Regular rate and rhythm, no murmurs or gallops Abd-soft  with active BS No Clubbing cyanosis edema Skin-warm and dry A & Oriented  Grossly normal sensory and motor function   EKG: Sinus at 61 Intervals 18/15/43 QRS upright lead V1 and negative lead I  Recent Labs: 11/18/2017: BUN 10; Creatinine, Ser 0.84; Hemoglobin 11.0; Platelets 293; Potassium 4.3; Sodium 140    Lipid Panel     Component Value Date/Time   CHOL 168 08/06/2010 1023   TRIG 160.0 (H) 08/06/2010 1023   HDL 47.20 08/06/2010 1023   CHOLHDL 4 08/06/2010 1023   VLDL 32.0 08/06/2010 1023   LDLCALC 89 08/06/2010 1023     Wt Readings from Last 3 Encounters:  04/11/18 158 lb 6.4 oz (71.8 kg)  11/18/17 157 lb (71.2 kg)  09/20/17 157 lb 12.8 oz (71.6 kg)     Other studies Reviewed:     ASSESSMENT AND PLAN:  Complete heart block  Hypertension  sleep disordered breathing and daytime somnolence  Cardiomyopathy  CRT-D-Medtronic  [name] device was interrogated.  The information was reviewed.    Euvolemic continue current meds  Blood pressure well controlled  Resolved cardiomyopathy continue current medications.  Reached out to the heart failure team regarding the value of the addition of sacubitril/valsartan to the valsartan itself.  We spent more than 50% of our >25 min visit in face to face counseling regarding the above      Signed, [name], MD  04/11/2018 3:55 PM      [facility] [address] [place] [address] [phone] (office) [phone] (fax)

## 2018-04-11 NOTE — Patient Instructions (Signed)
Medication Instructions:  Your physician recommends that you continue on your current medications as directed. Please refer to the Current Medication list given to you today.  Labwork: None ordered.  Testing/Procedures: None ordered.  Follow-Up: Your physician recommends that you schedule a follow-up appointment in:   Sept of 2020 with [name]  Any Other Special Instructions Will Be Listed Below (If Applicable).     If you need a refill on your cardiac medications before your next appointment, please call your pharmacy.

## 2018-04-14 LAB — CUP PACEART INCLINIC DEVICE CHECK
Implantable Lead Implant Date: 20121210
Implantable Lead Implant Date: 20170512
Implantable Lead Location: 753858
Implantable Lead Model: 4598
MDC IDC LEAD IMPLANT DT: 20170512
MDC IDC LEAD LOCATION: 753859
MDC IDC LEAD LOCATION: 753860
MDC IDC PG IMPLANT DT: 20170512
MDC IDC SESS DTM: 20200206092329

## 2018-04-21 ENCOUNTER — Ambulatory Visit (INDEPENDENT_AMBULATORY_CARE_PROVIDER_SITE_OTHER): Payer: BLUE CROSS/BLUE SHIELD

## 2018-04-21 DIAGNOSIS — I442 Atrioventricular block, complete: Secondary | ICD-10-CM

## 2018-04-21 DIAGNOSIS — I420 Dilated cardiomyopathy: Secondary | ICD-10-CM | POA: Diagnosis not present

## 2018-04-24 LAB — CUP PACEART REMOTE DEVICE CHECK
Brady Statistic AP VP Percent: 11.99 %
Brady Statistic AP VS Percent: 0.01 %
Brady Statistic AS VP Percent: 87.98 %
Brady Statistic AS VS Percent: 0.02 %
Brady Statistic RA Percent Paced: 11.83 %
Date Time Interrogation Session: 20200213093822
HighPow Impedance: 58 Ohm
Implantable Lead Implant Date: 20121210
Implantable Lead Implant Date: 20170512
Implantable Lead Location: 753858
Implantable Pulse Generator Implant Date: 20170512
Lead Channel Impedance Value: 399 Ohm
Lead Channel Impedance Value: 399 Ohm
Lead Channel Impedance Value: 418 Ohm
Lead Channel Impedance Value: 418 Ohm
Lead Channel Impedance Value: 418 Ohm
Lead Channel Impedance Value: 475 Ohm
Lead Channel Impedance Value: 760 Ohm
Lead Channel Impedance Value: 779 Ohm
Lead Channel Impedance Value: 836 Ohm
Lead Channel Pacing Threshold Amplitude: 0.5 V
Lead Channel Pacing Threshold Amplitude: 1 V
Lead Channel Pacing Threshold Pulse Width: 0.4 ms
Lead Channel Pacing Threshold Pulse Width: 0.4 ms
Lead Channel Pacing Threshold Pulse Width: 0.8 ms
Lead Channel Sensing Intrinsic Amplitude: 3.5 mV
Lead Channel Setting Pacing Amplitude: 1.5 V
Lead Channel Setting Pacing Amplitude: 2 V
Lead Channel Setting Pacing Pulse Width: 0.4 ms
Lead Channel Setting Sensing Sensitivity: 0.3 mV
MDC IDC LEAD IMPLANT DT: 20170512
MDC IDC LEAD LOCATION: 753859
MDC IDC LEAD LOCATION: 753860
MDC IDC MSMT BATTERY REMAINING LONGEVITY: 57 mo
MDC IDC MSMT BATTERY VOLTAGE: 2.97 V
MDC IDC MSMT LEADCHNL LV IMPEDANCE VALUE: 399 Ohm
MDC IDC MSMT LEADCHNL LV IMPEDANCE VALUE: 513 Ohm
MDC IDC MSMT LEADCHNL LV IMPEDANCE VALUE: 703 Ohm
MDC IDC MSMT LEADCHNL LV IMPEDANCE VALUE: 703 Ohm
MDC IDC MSMT LEADCHNL RA SENSING INTR AMPL: 3.5 mV
MDC IDC MSMT LEADCHNL RV PACING THRESHOLD AMPLITUDE: 0.625 V
MDC IDC MSMT LEADCHNL RV SENSING INTR AMPL: 0.875 mV
MDC IDC MSMT LEADCHNL RV SENSING INTR AMPL: 0.875 mV
MDC IDC SET LEADCHNL LV PACING AMPLITUDE: 2 V
MDC IDC SET LEADCHNL LV PACING PULSEWIDTH: 0.8 ms
MDC IDC STAT BRADY RV PERCENT PACED: 94.31 %

## 2018-05-02 ENCOUNTER — Ambulatory Visit (INDEPENDENT_AMBULATORY_CARE_PROVIDER_SITE_OTHER): Payer: BLUE CROSS/BLUE SHIELD

## 2018-05-02 DIAGNOSIS — I5022 Chronic systolic (congestive) heart failure: Secondary | ICD-10-CM

## 2018-05-02 DIAGNOSIS — Z95810 Presence of automatic (implantable) cardiac defibrillator: Secondary | ICD-10-CM

## 2018-05-02 NOTE — Progress Notes (Signed)
EPIC Encounter for ICM Monitoring  Patient Name: Joanne Bell is a 58 y.o. female Date: 05/02/2018 Primary Care Physican: [name], MD Primary [place] Electrophysiologist: Vergie Living Pacing: 97.4% Last weight: unknown  Attempted call to patient and unable to reach.  Left detailed message per DPR regarding transmission. Transmission reviewed.   Thoracic impedance normal.  Prescribed:No diuretic  Recommendations:None.  Follow-up plan: ICM clinic phone appointment on3/16/2020.   Copy of ICM check sent to [place]  3 month ICM trend: 05/02/2018    1 Year ICM trend:       [name], RN 05/02/2018 11:13 AM

## 2018-05-03 ENCOUNTER — Telehealth: Payer: Self-pay

## 2018-05-03 NOTE — Telephone Encounter (Signed)
Remote ICM transmission received.  Attempted call to patient regarding ICM remote transmission and left detailed message, per DPR, with next ICM remote transmission date of 05/23/2018.  Advised to return call for any fluid symptoms or questions.    

## 2018-05-03 NOTE — Progress Notes (Signed)
Remote ICD transmission.   

## 2018-05-21 ENCOUNTER — Other Ambulatory Visit: Payer: Self-pay | Admitting: Internal Medicine

## 2018-05-24 ENCOUNTER — Telehealth: Payer: Self-pay

## 2018-05-24 NOTE — Telephone Encounter (Signed)
Left message for patient to remind of missed remote transmission.  

## 2018-05-30 NOTE — Progress Notes (Signed)
No ICM remote transmission received for 05/23/2018 and next ICM transmission scheduled for 06/06/2018.

## 2018-06-06 ENCOUNTER — Other Ambulatory Visit: Payer: Self-pay

## 2018-06-06 ENCOUNTER — Ambulatory Visit (INDEPENDENT_AMBULATORY_CARE_PROVIDER_SITE_OTHER): Payer: BLUE CROSS/BLUE SHIELD

## 2018-06-06 DIAGNOSIS — Z95810 Presence of automatic (implantable) cardiac defibrillator: Secondary | ICD-10-CM

## 2018-06-06 DIAGNOSIS — I5022 Chronic systolic (congestive) heart failure: Secondary | ICD-10-CM

## 2018-06-07 ENCOUNTER — Telehealth: Payer: Self-pay

## 2018-06-07 NOTE — Telephone Encounter (Signed)
Remote ICM transmission received.  Attempted call to patient regarding ICM remote transmission and left detailed message, per DPR, with next ICM remote transmission date of 07/22/2018.  Advised to return call for any fluid symptoms or questions.

## 2018-06-07 NOTE — Progress Notes (Signed)
EPIC Encounter for ICM Monitoring  Patient Name: Joanne Bell is a 58 y.o. female Date: 06/07/2018 Primary Care Physican: [name], MD Primary [place] Electrophysiologist: Vergie Living Pacing: 96% Last weight: unknown  Attempted call to patient and unable to reach.  Left detailed message per DPR regarding transmission. Transmission reviewed.   Thoracic impedance normal.  Prescribed:No diuretic  Recommendations:None.  Follow-up plan: ICM clinic phone appointment on5/15/2020.   Copy of ICM check sent to [place]  3 month ICM trend: 06/06/2018    1 Year ICM trend:       [name], RN 06/07/2018 10:59 AM

## 2018-07-13 ENCOUNTER — Other Ambulatory Visit: Payer: Self-pay | Admitting: Internal Medicine

## 2018-07-13 MED ORDER — SACUBITRIL-VALSARTAN 97-103 MG PO TABS: 1.0000 | ORAL_TABLET | Freq: Two times a day (BID) | ORAL | 3 refills | Status: DC

## 2018-07-13 NOTE — Telephone Encounter (Signed)
Pt's medication was sent to pt's pharmacy as requested. Confirmation received.  °

## 2018-07-21 ENCOUNTER — Ambulatory Visit (INDEPENDENT_AMBULATORY_CARE_PROVIDER_SITE_OTHER): Payer: BLUE CROSS/BLUE SHIELD | Admitting: *Deleted

## 2018-07-21 ENCOUNTER — Other Ambulatory Visit: Payer: Self-pay

## 2018-07-21 DIAGNOSIS — I420 Dilated cardiomyopathy: Secondary | ICD-10-CM | POA: Diagnosis not present

## 2018-07-21 LAB — CUP PACEART REMOTE DEVICE CHECK
Battery Remaining Longevity: 51 mo
Battery Voltage: 2.97 V
Brady Statistic AP VP Percent: 4.76 %
Brady Statistic AP VS Percent: 0.01 %
Brady Statistic AS VP Percent: 95.23 %
Brady Statistic AS VS Percent: 0 %
Brady Statistic RA Percent Paced: 4.76 %
Brady Statistic RV Percent Paced: 98.93 %
Date Time Interrogation Session: 20200514041604
HighPow Impedance: 54 Ohm
Implantable Lead Implant Date: 20121210
Implantable Lead Implant Date: 20170512
Implantable Lead Implant Date: 20170512
Implantable Lead Location: 753858
Implantable Lead Location: 753859
Implantable Lead Location: 753860
Implantable Lead Model: 4598
Implantable Pulse Generator Implant Date: 20170512
Lead Channel Impedance Value: 361 Ohm
Lead Channel Impedance Value: 399 Ohm
Lead Channel Impedance Value: 399 Ohm
Lead Channel Impedance Value: 399 Ohm
Lead Channel Impedance Value: 399 Ohm
Lead Channel Impedance Value: 399 Ohm
Lead Channel Impedance Value: 475 Ohm
Lead Channel Impedance Value: 513 Ohm
Lead Channel Impedance Value: 608 Ohm
Lead Channel Impedance Value: 703 Ohm
Lead Channel Impedance Value: 722 Ohm
Lead Channel Impedance Value: 760 Ohm
Lead Channel Impedance Value: 817 Ohm
Lead Channel Pacing Threshold Amplitude: 0.5 V
Lead Channel Pacing Threshold Amplitude: 0.625 V
Lead Channel Pacing Threshold Amplitude: 1 V
Lead Channel Pacing Threshold Pulse Width: 0.4 ms
Lead Channel Pacing Threshold Pulse Width: 0.4 ms
Lead Channel Pacing Threshold Pulse Width: 0.8 ms
Lead Channel Sensing Intrinsic Amplitude: 0.875 mV
Lead Channel Sensing Intrinsic Amplitude: 0.875 mV
Lead Channel Sensing Intrinsic Amplitude: 3.5 mV
Lead Channel Sensing Intrinsic Amplitude: 3.5 mV
Lead Channel Setting Pacing Amplitude: 1.5 V
Lead Channel Setting Pacing Amplitude: 2 V
Lead Channel Setting Pacing Amplitude: 2 V
Lead Channel Setting Pacing Pulse Width: 0.4 ms
Lead Channel Setting Pacing Pulse Width: 0.8 ms
Lead Channel Setting Sensing Sensitivity: 0.3 mV

## 2018-07-22 ENCOUNTER — Other Ambulatory Visit: Payer: Self-pay

## 2018-07-22 ENCOUNTER — Ambulatory Visit (INDEPENDENT_AMBULATORY_CARE_PROVIDER_SITE_OTHER): Payer: BLUE CROSS/BLUE SHIELD

## 2018-07-22 DIAGNOSIS — Z95810 Presence of automatic (implantable) cardiac defibrillator: Secondary | ICD-10-CM | POA: Diagnosis not present

## 2018-07-22 DIAGNOSIS — I5022 Chronic systolic (congestive) heart failure: Secondary | ICD-10-CM

## 2018-07-25 ENCOUNTER — Other Ambulatory Visit: Payer: Self-pay | Admitting: Family Medicine

## 2018-07-25 DIAGNOSIS — Z1231 Encounter for screening mammogram for malignant neoplasm of breast: Secondary | ICD-10-CM

## 2018-07-25 NOTE — Progress Notes (Signed)
EPIC Encounter for ICM Monitoring  Patient Name: Joanne Bell is a 58 y.o. female Date: 07/25/2018 Primary Care Physican: [name], MD Primary [place] Electrophysiologist: Vergie Living Pacing: 96% Last weight: unknown  Transmission reviewed.   Thoracic impedance normal.  Prescribed:No diuretic  Recommendations:None.  Follow-up plan: ICM clinic phone appointment on6/16/2020.   Copy of ICM check sent to [name]  3 month ICM trend: 07/22/2018    1 Year ICM trend:       [name], RN 07/25/2018 4:36 PM

## 2018-07-28 ENCOUNTER — Encounter: Payer: Self-pay | Admitting: Cardiology

## 2018-07-28 NOTE — Progress Notes (Signed)
Remote ICD transmission.   

## 2018-08-17 ENCOUNTER — Other Ambulatory Visit: Payer: Self-pay | Admitting: *Deleted

## 2018-08-17 DIAGNOSIS — Z20822 Contact with and (suspected) exposure to covid-19: Secondary | ICD-10-CM

## 2018-08-17 NOTE — Progress Notes (Signed)
[id number] 

## 2018-08-18 LAB — NOVEL CORONAVIRUS, NAA: SARS-CoV-2, NAA: NOT DETECTED

## 2018-08-23 ENCOUNTER — Ambulatory Visit (INDEPENDENT_AMBULATORY_CARE_PROVIDER_SITE_OTHER): Payer: BC Managed Care – PPO

## 2018-08-23 DIAGNOSIS — I5022 Chronic systolic (congestive) heart failure: Secondary | ICD-10-CM | POA: Diagnosis not present

## 2018-08-23 DIAGNOSIS — Z95810 Presence of automatic (implantable) cardiac defibrillator: Secondary | ICD-10-CM

## 2018-08-24 NOTE — Progress Notes (Signed)
EPIC Encounter for ICM Monitoring  Patient Name: Joanne Bell is a 58 y.o. female Date: 08/24/2018 Primary Care Physican: [name], MD Primary [place] Electrophysiologist: Vergie Living Pacing: 98.1% Last weight: unknown  Spoke with patient and she is doing well.    Thoracic impedance normal.  Prescribed:No diuretic  Recommendations: Encouraged to call for an fluid symptoms.  Follow-up plan: ICM clinic phone appointment on7/20/2020.   Copy of ICM check sent to [place]  3 month ICM trend: 08/08/2018    1 Year ICM trend:       [name], RN 08/24/2018 8:51 AM

## 2018-09-13 ENCOUNTER — Ambulatory Visit
Admission: RE | Admit: 2018-09-13 | Discharge: 2018-09-13 | Disposition: A | Discharge status: Home / Self Care | Payer: BC Managed Care – PPO | Source: Ambulatory Visit | Attending: Family Medicine | Admitting: Family Medicine

## 2018-09-13 ENCOUNTER — Other Ambulatory Visit: Payer: Self-pay

## 2018-09-13 DIAGNOSIS — Z1231 Encounter for screening mammogram for malignant neoplasm of breast: Secondary | ICD-10-CM | POA: Diagnosis not present

## 2018-09-26 ENCOUNTER — Ambulatory Visit (INDEPENDENT_AMBULATORY_CARE_PROVIDER_SITE_OTHER): Payer: BC Managed Care – PPO

## 2018-09-26 DIAGNOSIS — I5022 Chronic systolic (congestive) heart failure: Secondary | ICD-10-CM

## 2018-09-26 DIAGNOSIS — Z95810 Presence of automatic (implantable) cardiac defibrillator: Secondary | ICD-10-CM

## 2018-09-30 NOTE — Progress Notes (Signed)
EPIC Encounter for ICM Monitoring  Patient Name: Joanne Bell is a 58 y.o. female Date: 09/30/2018 Primary Care Physican: [name], MD Primary [place] Electrophysiologist: Vergie Living Pacing: 97.8% Last weight: unknown  Transmission reviewed.     Optivol thoracic impedance normal.  Prescribed:No diuretic  Recommendations: None  Follow-up plan: ICM clinic phone appointment on8/25/2020.   Copy of ICM check sent to [place]  3 month ICM trend: 09/26/2018    1 Year ICM trend:       [name], RN 09/30/2018 9:41 AM

## 2018-11-01 ENCOUNTER — Ambulatory Visit (INDEPENDENT_AMBULATORY_CARE_PROVIDER_SITE_OTHER): Payer: BC Managed Care – PPO

## 2018-11-01 DIAGNOSIS — Z95810 Presence of automatic (implantable) cardiac defibrillator: Secondary | ICD-10-CM | POA: Diagnosis not present

## 2018-11-01 DIAGNOSIS — I5022 Chronic systolic (congestive) heart failure: Secondary | ICD-10-CM

## 2018-11-02 ENCOUNTER — Telehealth: Payer: Self-pay

## 2018-11-02 NOTE — Telephone Encounter (Signed)
Called pt and lvm to review meds

## 2018-11-03 ENCOUNTER — Telehealth: Payer: BC Managed Care – PPO | Admitting: Internal Medicine

## 2018-11-04 ENCOUNTER — Telehealth: Payer: Self-pay

## 2018-11-04 NOTE — Telephone Encounter (Signed)
Remote ICM transmission received.  Attempted call to patient regarding ICM remote transmission and left detailed message per DPR.  Advised to return call for any fluid symptoms or questions. Next ICM remote transmission scheduled 12/12/2018.

## 2018-11-04 NOTE — Progress Notes (Signed)
EPIC Encounter for ICM Monitoring  Patient Name: Joanne Bell is a 58 y.o. female Date: 11/04/2018 Primary Care Physican: [name], MD Primary [place] Electrophysiologist: Vergie Living Pacing: 96.4% Last weight: unknown  Attempted call to patient and unable to reach.  Left detailed message per DPR regarding transmission. Transmission reviewed.   Optivol thoracic impedance normal.  Prescribed:No diuretic  Recommendations: Left voice mail with ICM number and encouraged to call if experiencing any fluid symptoms.  Follow-up plan: ICM clinic phone appointment on10/07/2018. OV with [name] 12/16/2018.  Copy of ICM check sent to [place]  3 month ICM trend: 11/01/2018    1 Year ICM trend:       [name], RN 11/04/2018 2:41 PM

## 2018-11-16 ENCOUNTER — Other Ambulatory Visit: Payer: Self-pay

## 2018-11-16 DIAGNOSIS — Z20822 Contact with and (suspected) exposure to covid-19: Secondary | ICD-10-CM

## 2018-11-17 LAB — NOVEL CORONAVIRUS, NAA: SARS-CoV-2, NAA: NOT DETECTED

## 2018-12-12 ENCOUNTER — Ambulatory Visit (INDEPENDENT_AMBULATORY_CARE_PROVIDER_SITE_OTHER): Payer: BC Managed Care – PPO

## 2018-12-12 DIAGNOSIS — Z95810 Presence of automatic (implantable) cardiac defibrillator: Secondary | ICD-10-CM

## 2018-12-12 DIAGNOSIS — I5022 Chronic systolic (congestive) heart failure: Secondary | ICD-10-CM

## 2018-12-13 ENCOUNTER — Telehealth: Payer: Self-pay

## 2018-12-13 NOTE — Telephone Encounter (Signed)
Remote ICM transmission received.  Attempted call to patient regarding ICM remote transmission and left detailed message per DPR.  Advised to return call for any fluid symptoms or questions.  

## 2018-12-13 NOTE — Progress Notes (Signed)
EPIC Encounter for ICM Monitoring  Patient Name: Joanne Bell is a 58 y.o. female Date: 12/13/2018 Primary Care Physican: [name], MD Primary [place] Electrophysiologist: Vergie Living Pacing: 96.4% Last weight: unknown  Attempted call to patient and unable to reach.  Left detailed message per DPR regarding transmission. Transmission reviewed.   Optivol thoracic impedance normal.  Prescribed:No diuretic  Recommendations: Left voice mail with ICM number and encouraged to call if experiencing any fluid symptoms.  Follow-up plan: ICM clinic phone appointment on11/11/2018. OV with [name] 12/16/2018.  Copy of ICM check sent to [place]  3 month ICM trend: 12/12/2018    1 Year ICM trend:       [name], RN 12/13/2018 2:40 PM

## 2018-12-16 ENCOUNTER — Other Ambulatory Visit: Payer: Self-pay

## 2018-12-16 ENCOUNTER — Encounter: Payer: Self-pay | Admitting: Internal Medicine

## 2018-12-16 ENCOUNTER — Ambulatory Visit: Payer: BC Managed Care – PPO | Admitting: Internal Medicine

## 2018-12-16 ENCOUNTER — Encounter: Payer: BC Managed Care – PPO | Admitting: Internal Medicine

## 2018-12-16 VITALS — BP 140/88 | HR 55 | Ht 64.0 in | Wt 153.2 lb

## 2018-12-16 DIAGNOSIS — I442 Atrioventricular block, complete: Secondary | ICD-10-CM | POA: Diagnosis not present

## 2018-12-16 DIAGNOSIS — I10 Essential (primary) hypertension: Secondary | ICD-10-CM

## 2018-12-16 DIAGNOSIS — I420 Dilated cardiomyopathy: Secondary | ICD-10-CM | POA: Diagnosis not present

## 2018-12-16 DIAGNOSIS — Z95810 Presence of automatic (implantable) cardiac defibrillator: Secondary | ICD-10-CM | POA: Diagnosis not present

## 2018-12-16 NOTE — Patient Instructions (Signed)
Medication Instructions:   Your physician recommends that you continue on your current medications as directed. Please refer to the Current Medication list given to you today.  If you need a refill on your cardiac medications before your next appointment, please call your pharmacy.   Lab work:  You will have labs drawn today: BMET  If you have labs (blood work) drawn today and your tests are completely normal, you will receive your results only by: Marland Kitchen MyChart Message (if you have MyChart) OR . A paper copy in the mail If you have any lab test that is abnormal or we need to change your treatment, we will call you to review the results.  Testing/Procedures:  None ordered today  Follow-Up: At [facility], you and your health needs are our priority.  As part of our continuing mission to provide you with exceptional heart care, we have created designated Provider Care Teams.  These Care Teams include your primary Cardiologist (physician) and Advanced Practice Providers (APPs -  Physician Assistants and Nurse Practitioners) who all work together to provide you with the care you need, when you need it. You will need a follow up appointment in 12 months.  Please call our office 2 months in advance to schedule this appointment.  You may see [name], MD or one of the following Advanced Practice Providers on your designated Care Team:   Chanetta Marshall, NP . Tommye Standard, PA-C

## 2018-12-16 NOTE — Progress Notes (Signed)
Electrophysiology Office Note   Date:  12/16/2018   ID:  Joanne Bell, Joanne Bell 1960/09/12, MRN [id number]  PCP:  [name], MD  Cardiologist:    Primary Electrophysiologist: sk   No chief complaint on file.    History of Present Illness: Joanne Bell is a 58 y.o. female who presents today for electrophysiology  followup for a pacemaker implanted December 2012 for complete heart block and s/p CRT-D  Upgrade; device generator replacement 2017 Her workup for underlying causes was negative including  MRI  echo Lyme titers and chest x-ray these were all normal. EF was normal by MRI 12/12   She has hx hypertension. This is much improved   No LH since decreasing meds at last visit The patient denies chest pain, shortness of breath, nocturnal dyspnea, orthopnea or peripheral edema.  There have been no palpitations, lightheadedness or syncope.       Date Cr K Hgb  5/17 0.94 3.7 11.5   9/19 0.84 4.3 11.0               DATE TEST EF   12/12 cMRI 68%   1/15 Echo   45 %   11/17 Echo   30-35 %   2/18 Echo  45-50%   9/19.  Echo  55%         Past Medical History:  Diagnosis Date  . Atrioventricular block, complete (HCC)    narrow QRS  . Bilateral ovarian cysts   . 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 Chronic systolic CHF (congestive heart failure), NYHA class 3 ([place])   . Colon polyps   . Fibroids, intramural   . Hypertension   . NICM (nonischemic cardiomyopathy) ([place]) 07/20/2015   Past Surgical History:  Procedure Laterality Date  . COLONOSCOPY  2010   benign polyps, repeat in 5 yrs  . ENDOMETRIAL ABLATION  2004  . EP IMPLANTABLE DEVICE N/A 07/19/2015   Procedure: Upgrade to [place] ICD ;  Surgeon: Deboraha Sprang, MD;  Location: [place] CV LAB;  Service: Cardiovascular;  Laterality: N/A;  . MYOMECTOMY  1998   per Dr. Garwin Brothers  . PERMANENT PACEMAKER INSERTION Left 02/16/2011   Procedure: PERMANENT PACEMAKER  INSERTION;  Surgeon: Deboraha Sprang, MD;  Location: [facility] CATH LAB;  Service: Cardiovascular;  Laterality: Left;     Current Outpatient Medications  Medication Sig Dispense Refill  . carvedilol (COREG) 25 MG tablet Take 12.5 mg by mouth 2 (two) times daily with a meal.    . sacubitril-valsartan (ENTRESTO) 97-103 MG Take 0.5 tablets by mouth 2 (two) times daily.    . vitamin E 100 UNIT capsule Take 100 Units by mouth daily.     No current facility-administered medications for this visit.     Allergies:   Prednisone, Acyclovir and related, Augmentin [amoxicillin-pot clavulanate], Clonidine derivatives, and Percocet [oxycodone-acetaminophen]   Social History:  The patient  reports that she has never smoked. She has never used smokeless tobacco. She reports that she does not drink alcohol or use drugs.   Family History:  [name] family history includes Arthritis in her father; Depression in her mother; Diabetes in her father; Hyperlipidemia in her father; Hypertension in her father; Stroke in her father.    ROS:  Please see the history of present illness.   Otherwise, review of systems is positive for noen.   All other systems are reviewed and negative.    PHYSICAL EXAM: VS:  BP 140/88  Pulse (!) 55   Ht 5\' 4"  (1.626 m)   Wt 153 lb 3.2 oz (69.5 kg)   SpO2 96%   BMI 26.30 kg/m  , BMI Body mass index is 26.3 kg/m.   Well developed and well nourished in no acute distress HENT normal Neck supple with JVP-flat Clear Device pocket well healed; without hematoma or erythema.  There is no tethering  Regular rate and rhythm, no   murmur Abd-soft with active BS No Clubbing cyanosis   edema Skin-warm and dry A & Oriented  Grossly normal sensory and motor function  ECG Sinus P-synchronous/ AV  pacing Neg QRS lkead 1 and upright V1    Recent Labs: No results found for requested labs within last 8760 hours.    Lipid Panel     Component Value Date/Time   CHOL 168 08/06/2010  1023   TRIG 160.0 (H) 08/06/2010 1023   HDL 47.20 08/06/2010 1023   CHOLHDL 4 08/06/2010 1023   VLDL 32.0 08/06/2010 1023   LDLCALC 89 08/06/2010 1023     Wt Readings from Last 3 Encounters:  12/16/18 153 lb 3.2 oz (69.5 kg)  04/11/18 158 lb 6.4 oz (71.8 kg)  11/18/17 157 lb (71.2 kg)     Other studies Reviewed:     ASSESSMENT AND PLAN:  Complete heart block  Hypertension  sleep disordered breathing and daytime somnolence  Cardiomyopathy  CRT-D-Medtronic  [name] device was interrogated.  The information was reviewed.      Resolved cardiomyopathy  Euvolemic continue current meds  BP well controlled     Signed, [name], MD  12/16/2018 4:36 PM      [address] [place] [id number] [phone] (office) [phone] (fax)

## 2018-12-17 LAB — BASIC METABOLIC PANEL
BUN/Creatinine Ratio: 10 — ABNORMAL LOW (ref 12–28)
BUN: 10 mg/dL (ref 8–27)
CO2: 26 mmol/L (ref 20–29)
Calcium: 9.3 mg/dL (ref 8.7–10.3)
Chloride: 103 mmol/L (ref 96–106)
Creatinine, Ser: 0.96 mg/dL (ref 0.57–1.00)
GFR calc Af Amer: 74 mL/min/{1.73_m2} (ref >59)
GFR calc non Af Amer: 64 mL/min/{1.73_m2} (ref >59)
Glucose: 88 mg/dL (ref 65–99)
Potassium: 4.3 mmol/L (ref 3.5–5.2)
Sodium: 143 mmol/L (ref 134–144)

## 2018-12-21 LAB — CUP PACEART INCLINIC DEVICE CHECK
Battery Remaining Longevity: 42 mo
Battery Voltage: 2.96 V
Brady Statistic AP VP Percent: 6.01 %
Brady Statistic AP VS Percent: 0.01 %
Brady Statistic AS VP Percent: 93.97 %
Brady Statistic AS VS Percent: 0.01 %
Brady Statistic RA Percent Paced: 5.98 %
Brady Statistic RV Percent Paced: 97.92 %
Date Time Interrogation Session: 20201009202916
HighPow Impedance: 55 Ohm
Implantable Lead Implant Date: 20121210
Implantable Lead Implant Date: 20170512
Implantable Lead Implant Date: 20170512
Implantable Lead Location: 753858
Implantable Lead Location: 753859
Implantable Lead Location: 753860
Implantable Lead Model: 4598
Implantable Pulse Generator Implant Date: 20170512
Lead Channel Impedance Value: 342 Ohm
Lead Channel Impedance Value: 342 Ohm
Lead Channel Impedance Value: 399 Ohm
Lead Channel Impedance Value: 399 Ohm
Lead Channel Impedance Value: 456 Ohm
Lead Channel Impedance Value: 456 Ohm
Lead Channel Impedance Value: 513 Ohm
Lead Channel Impedance Value: 551 Ohm
Lead Channel Impedance Value: 608 Ohm
Lead Channel Impedance Value: 608 Ohm
Lead Channel Impedance Value: 722 Ohm
Lead Channel Impedance Value: 760 Ohm
Lead Channel Impedance Value: 817 Ohm
Lead Channel Pacing Threshold Amplitude: 0.5 V
Lead Channel Pacing Threshold Amplitude: 0.5 V
Lead Channel Pacing Threshold Amplitude: 1 V
Lead Channel Pacing Threshold Pulse Width: 0.4 ms
Lead Channel Pacing Threshold Pulse Width: 0.4 ms
Lead Channel Pacing Threshold Pulse Width: 0.8 ms
Lead Channel Sensing Intrinsic Amplitude: 3 mV
Lead Channel Setting Pacing Amplitude: 1.5 V
Lead Channel Setting Pacing Amplitude: 2 V
Lead Channel Setting Pacing Amplitude: 2 V
Lead Channel Setting Pacing Pulse Width: 0.4 ms
Lead Channel Setting Pacing Pulse Width: 0.8 ms
Lead Channel Setting Sensing Sensitivity: 0.3 mV

## 2018-12-22 ENCOUNTER — Encounter: Payer: Self-pay | Admitting: Family Medicine

## 2018-12-22 ENCOUNTER — Ambulatory Visit: Payer: BC Managed Care – PPO | Admitting: Family Medicine

## 2018-12-22 ENCOUNTER — Other Ambulatory Visit: Payer: Self-pay

## 2018-12-22 VITALS — BP 130/80 | HR 59 | Temp 97.3°F | Ht 64.0 in | Wt 152.6 lb

## 2018-12-22 DIAGNOSIS — G8929 Other chronic pain: Secondary | ICD-10-CM

## 2018-12-22 DIAGNOSIS — M25512 Pain in left shoulder: Secondary | ICD-10-CM

## 2018-12-22 DIAGNOSIS — Z23 Encounter for immunization: Secondary | ICD-10-CM | POA: Diagnosis not present

## 2018-12-22 MED ORDER — DICLOFENAC SODIUM 75 MG PO TBEC: 75.0000 mg | DELAYED_RELEASE_TABLET | Freq: Two times a day (BID) | ORAL | 2 refills | Status: DC

## 2018-12-22 NOTE — Progress Notes (Signed)
   Subjective:    Patient ID: Joanne Bell, female    DOB: 03-20-1960, 58 y.o.   MRN: [id number]  HPI Here for intermittent stiffness and pain in the left shoulder that started over a year ago. She has used heat and Aleve and topical creams with mixed success. No pain in the neck or down the arm.    Review of Systems  Constitutional: Negative.   Respiratory: Negative.   Cardiovascular: Negative.   Musculoskeletal: Positive for arthralgias.       Objective:   Physical Exam Constitutional:      Appearance: Normal appearance.  Cardiovascular:     Rate and Rhythm: Normal rate and regular rhythm.     Pulses: Normal pulses.     Heart sounds: Normal heart sounds.  Pulmonary:     Effort: Pulmonary effort is normal.     Breath sounds: Normal breath sounds.  Musculoskeletal:     Comments: The left shoulder is not tender and ROM is full, although full extension is a bit painful. Slight crepitus is felt   Neurological:     Mental Status: She is alert.           Assessment & Plan:  Shoulder pain, likely some osteoarthritis. Try Diclofenac prn.  Alysia Penna, MD

## 2019-01-16 ENCOUNTER — Ambulatory Visit (INDEPENDENT_AMBULATORY_CARE_PROVIDER_SITE_OTHER): Payer: BC Managed Care – PPO

## 2019-01-16 DIAGNOSIS — Z95810 Presence of automatic (implantable) cardiac defibrillator: Secondary | ICD-10-CM | POA: Diagnosis not present

## 2019-01-16 DIAGNOSIS — I5022 Chronic systolic (congestive) heart failure: Secondary | ICD-10-CM

## 2019-01-18 ENCOUNTER — Telehealth: Payer: Self-pay

## 2019-01-18 NOTE — Telephone Encounter (Signed)
Remote ICM transmission received.  Attempted call to patient regarding ICM remote transmission and left detailed message per DPR.  Advised to return call for any fluid symptoms or questions. Next ICM remote transmission scheduled 02/20/2019.

## 2019-01-18 NOTE — Progress Notes (Signed)
EPIC Encounter for ICM Monitoring  Patient Name: Joanne Bell is a 58 y.o. female Date: 01/18/2019 Primary Care Physican: [name], MD Primary [place] Electrophysiologist: Vergie Living Pacing: 96.6% Last weight: unknown  Attempted call to patient and unable to reach. Left detailed message per DPR regarding transmission. Transmission reviewed.   Optivol thoracic impedance normal.  Prescribed:No diuretic  Recommendations: Left voice mail with ICM number and encouraged to call if experiencing any fluid symptoms.  Follow-up plan: ICM clinic phone appointment on 02/20/2019.   91 day device clinic remote transmission 01/30/2019.    Copy of ICM check sent to Dr. [name].   3 month ICM trend: 01/16/2019    1 Year ICM trend:       [name], RN 01/18/2019 12:51 PM

## 2019-01-30 ENCOUNTER — Ambulatory Visit (INDEPENDENT_AMBULATORY_CARE_PROVIDER_SITE_OTHER): Payer: BC Managed Care – PPO | Admitting: *Deleted

## 2019-01-30 DIAGNOSIS — I5022 Chronic systolic (congestive) heart failure: Secondary | ICD-10-CM | POA: Diagnosis not present

## 2019-01-30 LAB — CUP PACEART REMOTE DEVICE CHECK
Battery Remaining Longevity: 39 mo
Battery Voltage: 2.96 V
Brady Statistic AP VP Percent: 6.13 %
Brady Statistic AP VS Percent: 0.01 %
Brady Statistic AS VP Percent: 93.85 %
Brady Statistic AS VS Percent: 0.01 %
Brady Statistic RA Percent Paced: 6.14 %
Brady Statistic RV Percent Paced: 99.95 %
Date Time Interrogation Session: 20201123052602
HighPow Impedance: 56 Ohm
Implantable Lead Implant Date: 20121210
Implantable Lead Implant Date: 20170512
Implantable Lead Implant Date: 20170512
Implantable Lead Location: 753858
Implantable Lead Location: 753859
Implantable Lead Location: 753860
Implantable Lead Model: 4598
Implantable Pulse Generator Implant Date: 20170512
Lead Channel Impedance Value: 342 Ohm
Lead Channel Impedance Value: 342 Ohm
Lead Channel Impedance Value: 361 Ohm
Lead Channel Impedance Value: 361 Ohm
Lead Channel Impedance Value: 418 Ohm
Lead Channel Impedance Value: 418 Ohm
Lead Channel Impedance Value: 456 Ohm
Lead Channel Impedance Value: 513 Ohm
Lead Channel Impedance Value: 551 Ohm
Lead Channel Impedance Value: 589 Ohm
Lead Channel Impedance Value: 665 Ohm
Lead Channel Impedance Value: 703 Ohm
Lead Channel Impedance Value: 722 Ohm
Lead Channel Pacing Threshold Amplitude: 0.5 V
Lead Channel Pacing Threshold Amplitude: 0.625 V
Lead Channel Pacing Threshold Amplitude: 1.125 V
Lead Channel Pacing Threshold Pulse Width: 0.4 ms
Lead Channel Pacing Threshold Pulse Width: 0.4 ms
Lead Channel Pacing Threshold Pulse Width: 0.8 ms
Lead Channel Sensing Intrinsic Amplitude: 0.875 mV
Lead Channel Sensing Intrinsic Amplitude: 0.875 mV
Lead Channel Sensing Intrinsic Amplitude: 2.5 mV
Lead Channel Sensing Intrinsic Amplitude: 2.5 mV
Lead Channel Setting Pacing Amplitude: 1.5 V
Lead Channel Setting Pacing Amplitude: 2 V
Lead Channel Setting Pacing Amplitude: 2.25 V
Lead Channel Setting Pacing Pulse Width: 0.4 ms
Lead Channel Setting Pacing Pulse Width: 0.8 ms
Lead Channel Setting Sensing Sensitivity: 0.3 mV

## 2019-02-20 ENCOUNTER — Ambulatory Visit (INDEPENDENT_AMBULATORY_CARE_PROVIDER_SITE_OTHER): Payer: BC Managed Care – PPO

## 2019-02-20 DIAGNOSIS — I5022 Chronic systolic (congestive) heart failure: Secondary | ICD-10-CM | POA: Diagnosis not present

## 2019-02-20 DIAGNOSIS — Z95810 Presence of automatic (implantable) cardiac defibrillator: Secondary | ICD-10-CM | POA: Diagnosis not present

## 2019-02-22 NOTE — Progress Notes (Signed)
EPIC Encounter for ICM Monitoring  Patient Name: Joanne Bell is a 58 y.o. female Date: 02/22/2019 Primary Care Physican: [name], MD Primary [place] Electrophysiologist: Vergie Living Pacing: 98.7% 12/16/2018 weight: 153 lbs  Transmission reviewed.   Optivol thoracic impedance normal.  Prescribed:No diuretic  Recommendations: None  Follow-up plan: ICM clinic phone appointment on 03/27/2019.   91 day device clinic remote transmission 05/01/2019.    Copy of ICM check sent to Dr. [name].  3 month ICM trend: 02/20/2019    1 Year ICM trend:       [name], RN 02/22/2019 10:48 AM

## 2019-03-27 ENCOUNTER — Ambulatory Visit (INDEPENDENT_AMBULATORY_CARE_PROVIDER_SITE_OTHER): Payer: BC Managed Care – PPO

## 2019-03-27 DIAGNOSIS — I5022 Chronic systolic (congestive) heart failure: Secondary | ICD-10-CM

## 2019-03-27 DIAGNOSIS — Z95810 Presence of automatic (implantable) cardiac defibrillator: Secondary | ICD-10-CM | POA: Diagnosis not present

## 2019-03-29 NOTE — Progress Notes (Signed)
EPIC Encounter for ICM Monitoring  Patient Name: Joanne Bell is a 59 y.o. female Date: 03/29/2019 Primary Care Physican: [name], MD Primary [place] Electrophysiologist: Vergie Living Pacing: 98.6% Last weight: 153 lbs  Transmission reviewed.   Optivol thoracic impedance normal.  Prescribed:No diuretic  Recommendations: None  Follow-up plan: ICM clinic phone appointment on2/23/2021. 91 day device clinic remote transmission 05/01/2019.   Copy of ICM check sent to [place].  3 month ICM trend: 03/27/2019    1 Year ICM trend:       [name], RN 03/29/2019 10:11 AM

## 2019-04-12 ENCOUNTER — Other Ambulatory Visit: Payer: Self-pay

## 2019-04-13 ENCOUNTER — Ambulatory Visit: Payer: BC Managed Care – PPO | Attending: Internal Medicine

## 2019-04-13 ENCOUNTER — Ambulatory Visit: Payer: BC Managed Care – PPO | Admitting: Family Medicine

## 2019-04-13 DIAGNOSIS — Z20822 Contact with and (suspected) exposure to covid-19: Secondary | ICD-10-CM | POA: Diagnosis not present

## 2019-04-14 LAB — NOVEL CORONAVIRUS, NAA: SARS-CoV-2, NAA: NOT DETECTED

## 2019-04-17 ENCOUNTER — Ambulatory Visit: Payer: BC Managed Care – PPO | Admitting: Family Medicine

## 2019-04-17 ENCOUNTER — Other Ambulatory Visit: Payer: Self-pay

## 2019-04-17 ENCOUNTER — Encounter: Payer: Self-pay | Admitting: Family Medicine

## 2019-04-17 ENCOUNTER — Ambulatory Visit (INDEPENDENT_AMBULATORY_CARE_PROVIDER_SITE_OTHER): Payer: BC Managed Care – PPO

## 2019-04-17 VITALS — BP 140/80 | HR 74 | Temp 97.3°F | Ht 64.0 in | Wt 156.2 lb

## 2019-04-17 DIAGNOSIS — G8929 Other chronic pain: Secondary | ICD-10-CM

## 2019-04-17 DIAGNOSIS — M25512 Pain in left shoulder: Secondary | ICD-10-CM

## 2019-04-17 NOTE — Progress Notes (Signed)
   Subjective:    Patient ID: Joanne Bell, female    DOB: August 16, 1960, 59 y.o.   MRN: [id number]  HPI Here for continuing pain in the left shoulder. It has not increased from before but it bothers her every day. We saw her for this last October, and I prescribed some Diclofenac for her to try. However she never got this filled because she thought it was a "pain medication" that would not improve the situation.    Review of Systems  Constitutional: Negative.   Respiratory: Negative.   Cardiovascular: Negative.   Musculoskeletal: Positive for arthralgias.       Objective:   Physical Exam Constitutional:      Appearance: Normal appearance.  Cardiovascular:     Rate and Rhythm: Normal rate and regular rhythm.     Pulses: Normal pulses.     Heart sounds: Normal heart sounds.  Pulmonary:     Effort: Pulmonary effort is normal.     Breath sounds: Normal breath sounds.  Musculoskeletal:     Comments: The left shoulder has full ROM but she feels pain at extremes of extension and internal rotation. No crepitus   Neurological:     Mental Status: She is alert.           Assessment & Plan:  Left shoulder pain. We will get Xrays of this today. I advised her that Diclofenac is an anti-inflammatory medication and I encouraged her to try it. She said she will get this filled.  Alysia Penna, MD

## 2019-04-18 ENCOUNTER — Telehealth: Payer: Self-pay | Admitting: Family Medicine

## 2019-04-18 NOTE — Telephone Encounter (Signed)
Pt would like call to go over her results, she does not understand what they mean.    Patient Phone: [phone]

## 2019-04-25 ENCOUNTER — Telehealth: Payer: Self-pay | Admitting: Family Medicine

## 2019-04-25 DIAGNOSIS — G8929 Other chronic pain: Secondary | ICD-10-CM

## 2019-04-25 DIAGNOSIS — M25512 Pain in left shoulder: Secondary | ICD-10-CM

## 2019-04-25 NOTE — Telephone Encounter (Signed)
Pt is following up on her xray results, she has been waiting for about a week.

## 2019-04-25 NOTE — Telephone Encounter (Signed)
Spoke with the patient. She does not want to start this medication until she knows what is causing the pain. She also stated that she has never seen an orthopedic doctor and would like a referral. Please advise.

## 2019-04-25 NOTE — Telephone Encounter (Signed)
First , I asked her to try the Diclofenac for the pain. If this is not working she should see the orthopedic doctor again

## 2019-04-25 NOTE — Telephone Encounter (Signed)
Patient is aware 

## 2019-04-25 NOTE — Telephone Encounter (Signed)
I understand. I just did the referral to Orthopedics so they will call her

## 2019-04-25 NOTE — Telephone Encounter (Signed)
Discussed results with the patient. She stated she is still in a lot of pain and would like to know what her next steps are or any recommendations.

## 2019-04-27 ENCOUNTER — Ambulatory Visit: Payer: BC Managed Care – PPO | Admitting: Orthopaedic Surgery

## 2019-04-28 ENCOUNTER — Ambulatory Visit: Payer: BC Managed Care – PPO | Admitting: Orthopaedic Surgery

## 2019-05-01 ENCOUNTER — Ambulatory Visit (INDEPENDENT_AMBULATORY_CARE_PROVIDER_SITE_OTHER): Payer: BC Managed Care – PPO | Admitting: *Deleted

## 2019-05-01 DIAGNOSIS — I5022 Chronic systolic (congestive) heart failure: Secondary | ICD-10-CM

## 2019-05-01 LAB — CUP PACEART REMOTE DEVICE CHECK
Battery Remaining Longevity: 35 mo
Battery Voltage: 2.96 V
Brady Statistic AP VP Percent: 3.88 %
Brady Statistic AP VS Percent: 0.01 %
Brady Statistic AS VP Percent: 96.11 %
Brady Statistic AS VS Percent: 0 %
Brady Statistic RA Percent Paced: 3.88 %
Brady Statistic RV Percent Paced: 98.84 %
Date Time Interrogation Session: 20210222022603
HighPow Impedance: 56 Ohm
Implantable Lead Implant Date: 20121210
Implantable Lead Implant Date: 20170512
Implantable Lead Implant Date: 20170512
Implantable Lead Location: 753858
Implantable Lead Location: 753859
Implantable Lead Location: 753860
Implantable Lead Model: 4598
Implantable Pulse Generator Implant Date: 20170512
Lead Channel Impedance Value: 342 Ohm
Lead Channel Impedance Value: 342 Ohm
Lead Channel Impedance Value: 361 Ohm
Lead Channel Impedance Value: 399 Ohm
Lead Channel Impedance Value: 418 Ohm
Lead Channel Impedance Value: 418 Ohm
Lead Channel Impedance Value: 475 Ohm
Lead Channel Impedance Value: 513 Ohm
Lead Channel Impedance Value: 551 Ohm
Lead Channel Impedance Value: 589 Ohm
Lead Channel Impedance Value: 703 Ohm
Lead Channel Impedance Value: 703 Ohm
Lead Channel Impedance Value: 779 Ohm
Lead Channel Pacing Threshold Amplitude: 0.5 V
Lead Channel Pacing Threshold Amplitude: 0.5 V
Lead Channel Pacing Threshold Amplitude: 1.125 V
Lead Channel Pacing Threshold Pulse Width: 0.4 ms
Lead Channel Pacing Threshold Pulse Width: 0.4 ms
Lead Channel Pacing Threshold Pulse Width: 0.8 ms
Lead Channel Sensing Intrinsic Amplitude: 0.875 mV
Lead Channel Sensing Intrinsic Amplitude: 0.875 mV
Lead Channel Sensing Intrinsic Amplitude: 2.625 mV
Lead Channel Sensing Intrinsic Amplitude: 2.625 mV
Lead Channel Setting Pacing Amplitude: 1.5 V
Lead Channel Setting Pacing Amplitude: 2 V
Lead Channel Setting Pacing Amplitude: 2.25 V
Lead Channel Setting Pacing Pulse Width: 0.4 ms
Lead Channel Setting Pacing Pulse Width: 0.8 ms
Lead Channel Setting Sensing Sensitivity: 0.3 mV

## 2019-05-02 ENCOUNTER — Ambulatory Visit (INDEPENDENT_AMBULATORY_CARE_PROVIDER_SITE_OTHER): Payer: BC Managed Care – PPO

## 2019-05-02 ENCOUNTER — Ambulatory Visit: Payer: BC Managed Care – PPO | Admitting: Orthopaedic Surgery

## 2019-05-02 ENCOUNTER — Encounter: Payer: Self-pay | Admitting: Orthopaedic Surgery

## 2019-05-02 ENCOUNTER — Other Ambulatory Visit: Payer: Self-pay

## 2019-05-02 VITALS — Ht 64.0 in | Wt 156.0 lb

## 2019-05-02 DIAGNOSIS — I5022 Chronic systolic (congestive) heart failure: Secondary | ICD-10-CM | POA: Diagnosis not present

## 2019-05-02 DIAGNOSIS — M7552 Bursitis of left shoulder: Secondary | ICD-10-CM

## 2019-05-02 DIAGNOSIS — M5412 Radiculopathy, cervical region: Secondary | ICD-10-CM | POA: Diagnosis not present

## 2019-05-02 DIAGNOSIS — Z95810 Presence of automatic (implantable) cardiac defibrillator: Secondary | ICD-10-CM

## 2019-05-02 MED ORDER — METHYLPREDNISOLONE ACETATE 40 MG/ML IJ SUSP
40.0000 mg | INTRAMUSCULAR | Status: AC | PRN
  Administered 2019-05-02: 15:00:00 40 mg via INTRA_ARTICULAR

## 2019-05-02 MED ORDER — BUPIVACAINE HCL 0.25 % IJ SOLN
2.0000 mL | INTRAMUSCULAR | Status: AC | PRN
  Administered 2019-05-02: 2 mL via INTRA_ARTICULAR

## 2019-05-02 MED ORDER — LIDOCAINE HCL 2 % IJ SOLN
2.0000 mL | INTRAMUSCULAR | Status: AC | PRN
  Administered 2019-05-02: 2 mL

## 2019-05-02 NOTE — Progress Notes (Signed)
Office Visit Note   Patient: Joanne Bell           Date of Birth: 06/14/1960           MRN: [id number] Visit Date: 05/02/2019              Requested by: [name], MD [place],  [place] [id number] PCP: [name], MD   Assessment & Plan: Visit Diagnoses:  1. Bursitis of left shoulder   2. Radiculopathy of cervical spine     Plan: Impression is left shoulder subacromial bursitis versus questionable cervical spine component.  We will proceed with left shoulder subacromial cortisone injection today.  She had good immediate relief during the anesthetic phase.  She will follow-up with [place] as needed.  Follow-Up Instructions: Return if symptoms worsen or fail to improve.   Orders:  Orders Placed This Encounter  Procedures   Large Joint Inj   No orders of the defined types were placed in this encounter.     Procedures: Large Joint Inj: L subacromial bursa on 05/02/2019 2:42 PM Indications: pain Details: 22 G needle Medications: 2 mL lidocaine 2 %; 2 mL bupivacaine 0.25 %; 40 mg methylPREDNISolone acetate 40 MG/ML Outcome: tolerated well, no immediate complications Patient was prepped and draped in the usual sterile fashion.       Clinical Data: No additional findings.   Subjective: Chief Complaint  Patient presents with   Neck - Pain   Left Shoulder - Pain    HPI patient is a 59 year old female who comes in today with left shoulder pain.  This has been ongoing for about 1 years time.  No new injury or change in activity.  The pain she has does not occur on a daily basis but is becoming more frequent.  The pain is to the lateral neck and radiates down the entire arm and into the hand.  Pain is aggravated with sleeping on the left side.  She does note occasional stiffness first thing in the morning.  She takes Advil with moderate relief of symptoms.  She denies any numbness, tingling or burning.  No previous cortisone injection to the  left shoulder.  She does have a history of severe degenerative disc disease with anterior spurring C5-6 and C6-7.  She was seen by [place] in 2019 for her right shoulder.  Subacromial injection performed as well as formal physical therapy which provided significant relief of symptoms.  No complaints of the right shoulder at this point.  Review of Systems as detailed in HPI.  All others reviewed and are negative.   Objective: Vital Signs: Ht 5\' 4"  (1.626 m)    Wt 156 lb (70.8 kg)    BMI 26.78 kg/m   Physical Exam well-developed well-nourished female no acute distress.  Alert and oriented x3.  Ortho Exam examination of her cervical spine reveals no spinous or paraspinous tenderness.  She has somewhat limited range of motion of the cervical spine due to stiffness.  This is without pain.  Slight tenderness to the left-sided parascapular trigger points.  She has full active range of motion without pain to the left shoulder.  She does have a moderately positive empty can.  Mildly positive cross body adduction.  Mild tenderness to the [facility] joint.  No focal weakness.  She is neurovascularly intact distally.  Specialty Comments:  No specialty comments available.  Imaging: X-rays reviewed by me in canopy of the left shoulder show mild spurring to  the [facility] joint.  Otherwise, no acute findings.   PMFS History: Patient Active Problem List   Diagnosis Date Noted   Chronic left shoulder pain 04/17/2019   Neck pain 08/12/2017   NICM (nonischemic cardiomyopathy) ([place]) 07/20/2015   Biventricular ICD (implantable cardioverter-defibrillator) Medtronic [id number]   Chronic systolic CHF (congestive heart failure), NYHA class 3 ([place]) 07/19/2015   Syncope 04/01/2013   Orthostatic hypotension 04/01/2013   Atrioventricular block, complete ([place])    SPRAIN&STRAIN OTHER SPECIFIED SITES HIP&THIGH 01/03/2010   ANEMIA-NOS 05/01/2009   Essential hypertension 05/01/2009   OVARIAN CYST 05/01/2009   Keloid  05/01/2009   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 0000000   Chronic systolic CHF (congestive heart failure), NYHA class 3 (HCC)    Colon polyps    Fibroids, intramural    Hypertension    NICM (nonischemic cardiomyopathy) ([place]) 07/20/2015    Family History  Problem Relation Age of Onset   Depression Mother    Arthritis Father    Diabetes Father    Hyperlipidemia Father    Hypertension Father    Stroke Father     Past Surgical History:  Procedure Laterality Date   COLONOSCOPY  2010   benign polyps, repeat in 5 yrs   ENDOMETRIAL ABLATION  2004   EP IMPLANTABLE DEVICE N/A 07/19/2015   Procedure: Upgrade to [place] ICD ;  Surgeon: Deboraha Sprang, MD;  Location: [place] CV LAB;  Service: Cardiovascular;  Laterality: N/A;   MYOMECTOMY  1998   per Dr. 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

## 2019-05-02 NOTE — Progress Notes (Signed)
ICD Remote  

## 2019-05-05 DIAGNOSIS — Z01818 Encounter for other preprocedural examination: Secondary | ICD-10-CM | POA: Diagnosis not present

## 2019-05-08 NOTE — Progress Notes (Signed)
EPIC Encounter for ICM Monitoring  Patient Name: Joanne Bell is a 59 y.o. female Date: 05/08/2019 Primary Care Physican: [name], MD Primary [place] Electrophysiologist: Vergie Living Pacing: 98.8% Lastweight: 153 lbs  Transmission reviewed.   Optivol thoracic impedance normal.  Prescribed:No diuretic  Recommendations: None  Follow-up plan: ICM clinic phone appointment on3/29/2021. 91 day device clinic remote transmission5/24/2021.   Copy of ICM check sent to [place].  3 month ICM trend: 05/01/2019    1 Year ICM trend:       [name], RN 05/08/2019 9:24 AM

## 2019-05-19 DIAGNOSIS — Z1159 Encounter for screening for other viral diseases: Secondary | ICD-10-CM | POA: Diagnosis not present

## 2019-05-24 ENCOUNTER — Encounter: Payer: Self-pay | Admitting: Family Medicine

## 2019-05-24 DIAGNOSIS — K5730 Diverticulosis of large intestine without perforation or abscess without bleeding: Secondary | ICD-10-CM | POA: Diagnosis not present

## 2019-05-24 DIAGNOSIS — Z1211 Encounter for screening for malignant neoplasm of colon: Secondary | ICD-10-CM | POA: Diagnosis not present

## 2019-05-24 DIAGNOSIS — K635 Polyp of colon: Secondary | ICD-10-CM | POA: Diagnosis not present

## 2019-05-24 DIAGNOSIS — D122 Benign neoplasm of ascending colon: Secondary | ICD-10-CM | POA: Diagnosis not present

## 2019-05-24 HISTORY — PX: COLONOSCOPY: SHX174

## 2019-06-05 ENCOUNTER — Ambulatory Visit (INDEPENDENT_AMBULATORY_CARE_PROVIDER_SITE_OTHER): Payer: BC Managed Care – PPO

## 2019-06-05 DIAGNOSIS — Z95810 Presence of automatic (implantable) cardiac defibrillator: Secondary | ICD-10-CM

## 2019-06-05 DIAGNOSIS — I5022 Chronic systolic (congestive) heart failure: Secondary | ICD-10-CM

## 2019-06-06 ENCOUNTER — Telehealth: Payer: Self-pay

## 2019-06-06 NOTE — Progress Notes (Signed)
EPIC Encounter for ICM Monitoring  Patient Name: Joanne Bell is a 59 y.o. female Date: 06/06/2019 Primary Care Physican: [name], MD Primary [place] Electrophysiologist: Vergie Living Pacing: 99.9% Lastweight: 153 lbs  Attempted call to patient and unable to reach.  Left detailed message per DPR regarding transmission. Transmission reviewed.   Optivol thoracic impedance normal.  Prescribed:No diuretic  Recommendations:No changes and encouraged to call if experiencing any fluid symptoms.  Follow-up plan: ICM clinic phone appointment on5/05/2019. 91 day device clinic remote transmission5/24/2021.   Copy of ICM check sent to [place].  3 month ICM trend: 06/05/2019    1 Year ICM trend:       [name], RN 06/06/2019 1:16 PM

## 2019-06-06 NOTE — Telephone Encounter (Signed)
Remote ICM transmission received.  Attempted call to patient regarding ICM remote transmission and left detailed message regarding transmission.  Advised to return call for any fluid symptoms or questions.

## 2019-06-10 ENCOUNTER — Other Ambulatory Visit: Payer: Self-pay | Admitting: Internal Medicine

## 2019-07-10 ENCOUNTER — Ambulatory Visit (INDEPENDENT_AMBULATORY_CARE_PROVIDER_SITE_OTHER): Payer: BC Managed Care – PPO

## 2019-07-10 DIAGNOSIS — I5022 Chronic systolic (congestive) heart failure: Secondary | ICD-10-CM

## 2019-07-10 DIAGNOSIS — Z95810 Presence of automatic (implantable) cardiac defibrillator: Secondary | ICD-10-CM | POA: Diagnosis not present

## 2019-07-12 NOTE — Progress Notes (Signed)
EPIC Encounter for ICM Monitoring  Patient Name: Joanne Bell is a 59 y.o. female Date: 07/12/2019 Primary Care Physican: [name], MD Primary [place] Electrophysiologist: Vergie Living Pacing: 99.1% Lastweight: 153 lbs  Transmission reviewed.   Optivol thoracic impedance normal.  Prescribed:No diuretic  Recommendations: None  Follow-up plan: ICM clinic phone appointment on6/09/2019. 91 day device clinic remote transmission5/24/2021.   Copy of ICM check sent to [place].  3 month ICM trend: 07/10/2019    1 Year ICM trend:       [name], RN 07/12/2019 8:21 AM

## 2019-07-20 ENCOUNTER — Other Ambulatory Visit: Payer: Self-pay

## 2019-07-20 MED ORDER — ENTRESTO 97-103 MG PO TABS: 0.5000 | ORAL_TABLET | Freq: Two times a day (BID) | ORAL | 5 refills | Status: DC

## 2019-07-31 ENCOUNTER — Ambulatory Visit (INDEPENDENT_AMBULATORY_CARE_PROVIDER_SITE_OTHER): Payer: BC Managed Care – PPO | Admitting: *Deleted

## 2019-07-31 DIAGNOSIS — I442 Atrioventricular block, complete: Secondary | ICD-10-CM

## 2019-07-31 LAB — CUP PACEART REMOTE DEVICE CHECK
Battery Remaining Longevity: 30 mo
Battery Voltage: 2.96 V
Brady Statistic AP VP Percent: 16.21 %
Brady Statistic AP VS Percent: 0.01 %
Brady Statistic AS VP Percent: 83.77 %
Brady Statistic AS VS Percent: 0.01 %
Brady Statistic RA Percent Paced: 16.13 %
Brady Statistic RV Percent Paced: 98.07 %
Date Time Interrogation Session: 20210524022703
HighPow Impedance: 54 Ohm
Implantable Lead Implant Date: 20121210
Implantable Lead Implant Date: 20170512
Implantable Lead Implant Date: 20170512
Implantable Lead Location: 753858
Implantable Lead Location: 753859
Implantable Lead Location: 753860
Implantable Lead Model: 4598
Implantable Pulse Generator Implant Date: 20170512
Lead Channel Impedance Value: 342 Ohm
Lead Channel Impedance Value: 361 Ohm
Lead Channel Impedance Value: 361 Ohm
Lead Channel Impedance Value: 399 Ohm
Lead Channel Impedance Value: 418 Ohm
Lead Channel Impedance Value: 418 Ohm
Lead Channel Impedance Value: 475 Ohm
Lead Channel Impedance Value: 532 Ohm
Lead Channel Impedance Value: 589 Ohm
Lead Channel Impedance Value: 589 Ohm
Lead Channel Impedance Value: 703 Ohm
Lead Channel Impedance Value: 722 Ohm
Lead Channel Impedance Value: 760 Ohm
Lead Channel Pacing Threshold Amplitude: 0.5 V
Lead Channel Pacing Threshold Amplitude: 0.625 V
Lead Channel Pacing Threshold Amplitude: 1.125 V
Lead Channel Pacing Threshold Pulse Width: 0.4 ms
Lead Channel Pacing Threshold Pulse Width: 0.4 ms
Lead Channel Pacing Threshold Pulse Width: 0.8 ms
Lead Channel Sensing Intrinsic Amplitude: 11.25 mV
Lead Channel Sensing Intrinsic Amplitude: 11.25 mV
Lead Channel Sensing Intrinsic Amplitude: 2.625 mV
Lead Channel Sensing Intrinsic Amplitude: 2.625 mV
Lead Channel Setting Pacing Amplitude: 1.5 V
Lead Channel Setting Pacing Amplitude: 2 V
Lead Channel Setting Pacing Amplitude: 2.25 V
Lead Channel Setting Pacing Pulse Width: 0.4 ms
Lead Channel Setting Pacing Pulse Width: 0.8 ms
Lead Channel Setting Sensing Sensitivity: 0.3 mV

## 2019-08-01 NOTE — Progress Notes (Signed)
Remote ICD transmission.   

## 2019-08-11 DIAGNOSIS — Z20828 Contact with and (suspected) exposure to other viral communicable diseases: Secondary | ICD-10-CM | POA: Diagnosis not present

## 2019-08-14 ENCOUNTER — Ambulatory Visit (INDEPENDENT_AMBULATORY_CARE_PROVIDER_SITE_OTHER): Payer: BC Managed Care – PPO

## 2019-08-14 DIAGNOSIS — I5022 Chronic systolic (congestive) heart failure: Secondary | ICD-10-CM | POA: Diagnosis not present

## 2019-08-14 DIAGNOSIS — Z95810 Presence of automatic (implantable) cardiac defibrillator: Secondary | ICD-10-CM

## 2019-08-15 ENCOUNTER — Telehealth (INDEPENDENT_AMBULATORY_CARE_PROVIDER_SITE_OTHER): Payer: BC Managed Care – PPO | Admitting: Family Medicine

## 2019-08-15 ENCOUNTER — Encounter: Payer: Self-pay | Admitting: Family Medicine

## 2019-08-15 DIAGNOSIS — J019 Acute sinusitis, unspecified: Secondary | ICD-10-CM | POA: Diagnosis not present

## 2019-08-15 MED ORDER — HYDROCODONE-HOMATROPINE 5-1.5 MG/5ML PO SYRP: 5.0000 mL | ORAL_SOLUTION | ORAL | 0 refills | Status: DC | PRN

## 2019-08-15 MED ORDER — LEVOFLOXACIN 500 MG PO TABS: 500.0000 mg | ORAL_TABLET | Freq: Every day | ORAL | 0 refills | Status: AC

## 2019-08-15 NOTE — Progress Notes (Signed)
   Subjective:    Patient ID: Joanne Bell, female    DOB: [birth year], 58 y.o.   MRN: [id number]  HPI Virtual Visit via Telephone Note  I connected with the patient on 08/15/19 at  9:30 AM EDT by telephone and verified that I am speaking with the correct person using two identifiers.   I discussed the limitations, risks, security and privacy concerns of performing an evaluation and management service by telephone and the availability of in person appointments. I also discussed with the patient that there may be a patient responsible charge related to this service. The patient expressed understanding and agreed to proceed.  Location patient: home Location provider: work or home office Participants present for the call: patient, provider Patient did not have a visit in the prior 7 days to address this/these issue(s).   History of Present Illness: Here for 6 days of sinus pressure, PND, blowing green mucus from the nose, and cough. No fever. Using Mucinex. She had a negative Covid-19 test on 08-11-19.    Observations/Objective: Patient sounds cheerful and well on the phone. I do not appreciate any SOB. Speech and thought processing are grossly intact. Patient reported vitals:  Assessment and Plan: Sinusitis, treat with Levaquin.  Alysia Penna, MD   Follow Up Instructions:     [phone] 5-10 [phone] 11-20 [phone] 21-30 I did not refer this patient for an OV in the next 24 hours for this/these issue(s).  I discussed the assessment and treatment plan with the patient. The patient was provided an opportunity to ask questions and all were answered. The patient agreed with the plan and demonstrated an understanding of the instructions.   The patient was advised to call back or seek an in-person evaluation if the symptoms worsen or if the condition fails to improve as anticipated.  I provided 10 minutes of non-face-to-face time during this encounter.   Alysia Penna, MD    Review  of Systems     Objective:   Physical Exam        Assessment & Plan:

## 2019-08-16 NOTE — Progress Notes (Signed)
EPIC Encounter for ICM Monitoring  Patient Name: Joanne Bell is a 59 y.o. female Date: 08/16/2019 Primary Care Physican: [name], MD Primary [place] Electrophysiologist: Vergie Living Pacing: 99.4% Lastweight: 153 lbs  Transmission reviewed.  Optivol thoracic impedance normal.  Prescribed:No diuretic  Recommendations: None  Follow-up plan: ICM clinic phone appointment on7/02/2020. 91 day device clinic remote transmission5/24/2021.   Copy of ICM check sent to [place].  3 month ICM trend: 08/14/2019    1 Year ICM trend:       [name], RN 08/16/2019 4:42 PM

## 2019-08-18 ENCOUNTER — Telehealth: Payer: Self-pay | Admitting: Family Medicine

## 2019-08-18 MED ORDER — VALACYCLOVIR HCL 1 G PO TABS
1000.0000 mg | ORAL_TABLET | Freq: Three times a day (TID) | ORAL | 0 refills | Status: DC
Start: 2019-08-18 — End: 2020-01-22

## 2019-08-18 MED ORDER — AZITHROMYCIN 250 MG PO TABS
ORAL_TABLET | ORAL | 0 refills | Status: DC
Start: 2019-08-18 — End: 2020-01-22

## 2019-08-18 NOTE — Telephone Encounter (Signed)
Patient has been scheduled for an appointment on Monday. Please advise. Is there anything we can do for her in the mean time.

## 2019-08-18 NOTE — Telephone Encounter (Signed)
I would need to see the rash to treat it. Ask her where the rash is located

## 2019-08-18 NOTE — Telephone Encounter (Signed)
Stop the Levaquin and call in a Zpack. Also for the shingles, call in Valtrex 1000 mg TID for 10 days

## 2019-08-18 NOTE — Telephone Encounter (Signed)
Rx sent in. Patient is aware of medication changes and that these prescriptions have been sent into her pharmacy.

## 2019-08-18 NOTE — Telephone Encounter (Signed)
Spoke with the pt and she stated there is a red, painful, cluster like area in 2 spots only on the left buttock for the past 2 days.  Patient stated this is the same area Dr Sarajane Jews diagnosed as shingles years ago and he told her this could occur again. She also wanted to let Dr Sarajane Jews know Levaquin has caused stomach pain despite taking with food.  Message sent to PCP.

## 2019-08-18 NOTE — Telephone Encounter (Signed)
Pt having problems with levofloxacin (LEVAQUIN) 500 MG tablet it not making her sleep. Pt said she has broken out with shingles. She wants to know if the dr can prescribe her something [phone].

## 2019-08-21 ENCOUNTER — Telehealth: Payer: BC Managed Care – PPO | Admitting: Family Medicine

## 2019-09-18 ENCOUNTER — Ambulatory Visit (INDEPENDENT_AMBULATORY_CARE_PROVIDER_SITE_OTHER): Payer: BC Managed Care – PPO

## 2019-09-18 DIAGNOSIS — Z95810 Presence of automatic (implantable) cardiac defibrillator: Secondary | ICD-10-CM | POA: Diagnosis not present

## 2019-09-18 DIAGNOSIS — I5022 Chronic systolic (congestive) heart failure: Secondary | ICD-10-CM | POA: Diagnosis not present

## 2019-09-22 NOTE — Progress Notes (Signed)
EPIC Encounter for ICM Monitoring  Patient Name: Joanne Bell is a 59 y.o. female Date: 09/22/2019 Primary Care Physican: [name], MD Primary [place] Electrophysiologist: Vergie Living Pacing: 98.7% Lastweight: 153 lbs  Transmission reviewed.  Optivol thoracic impedance normal.  Prescribed:No diuretic  Recommendations:No changes.  Follow-up plan: ICM clinic phone appointment on8/24/2021. 91 day device clinic remote transmission8/23/2021.   EP/Cardiology Office Visits: 12/16/2019 with Dr. [name].    Copy of ICM check sent to Dr. [name].   3 month ICM trend: 09/18/2019    1 Year ICM trend:       [name], RN 09/22/2019 9:33 AM

## 2019-10-30 ENCOUNTER — Ambulatory Visit (INDEPENDENT_AMBULATORY_CARE_PROVIDER_SITE_OTHER): Payer: BC Managed Care – PPO | Admitting: *Deleted

## 2019-10-30 DIAGNOSIS — I442 Atrioventricular block, complete: Secondary | ICD-10-CM

## 2019-10-31 ENCOUNTER — Ambulatory Visit (INDEPENDENT_AMBULATORY_CARE_PROVIDER_SITE_OTHER): Payer: BC Managed Care – PPO

## 2019-10-31 DIAGNOSIS — I5022 Chronic systolic (congestive) heart failure: Secondary | ICD-10-CM

## 2019-10-31 DIAGNOSIS — Z95810 Presence of automatic (implantable) cardiac defibrillator: Secondary | ICD-10-CM | POA: Diagnosis not present

## 2019-10-31 LAB — CUP PACEART REMOTE DEVICE CHECK
Battery Remaining Longevity: 28 mo
Battery Voltage: 2.95 V
Brady Statistic AP VP Percent: 2.74 %
Brady Statistic AP VS Percent: 0.01 %
Brady Statistic AS VP Percent: 97.23 %
Brady Statistic AS VS Percent: 0.02 %
Brady Statistic RA Percent Paced: 2.74 %
Brady Statistic RV Percent Paced: 99.05 %
Date Time Interrogation Session: 20210823022502
HighPow Impedance: 59 Ohm
Implantable Lead Implant Date: 20121210
Implantable Lead Implant Date: 20170512
Implantable Lead Implant Date: 20170512
Implantable Lead Location: 753858
Implantable Lead Location: 753859
Implantable Lead Location: 753860
Implantable Lead Model: 4598
Implantable Pulse Generator Implant Date: 20170512
Lead Channel Impedance Value: 342 Ohm
Lead Channel Impedance Value: 342 Ohm
Lead Channel Impedance Value: 399 Ohm
Lead Channel Impedance Value: 399 Ohm
Lead Channel Impedance Value: 418 Ohm
Lead Channel Impedance Value: 456 Ohm
Lead Channel Impedance Value: 456 Ohm
Lead Channel Impedance Value: 513 Ohm
Lead Channel Impedance Value: 589 Ohm
Lead Channel Impedance Value: 608 Ohm
Lead Channel Impedance Value: 665 Ohm
Lead Channel Impedance Value: 665 Ohm
Lead Channel Impedance Value: 703 Ohm
Lead Channel Pacing Threshold Amplitude: 0.5 V
Lead Channel Pacing Threshold Amplitude: 0.625 V
Lead Channel Pacing Threshold Amplitude: 0.875 V
Lead Channel Pacing Threshold Pulse Width: 0.4 ms
Lead Channel Pacing Threshold Pulse Width: 0.4 ms
Lead Channel Pacing Threshold Pulse Width: 0.8 ms
Lead Channel Sensing Intrinsic Amplitude: 11.25 mV
Lead Channel Sensing Intrinsic Amplitude: 11.25 mV
Lead Channel Sensing Intrinsic Amplitude: 3.25 mV
Lead Channel Sensing Intrinsic Amplitude: 3.25 mV
Lead Channel Setting Pacing Amplitude: 1.5 V
Lead Channel Setting Pacing Amplitude: 2 V
Lead Channel Setting Pacing Amplitude: 2 V
Lead Channel Setting Pacing Pulse Width: 0.4 ms
Lead Channel Setting Pacing Pulse Width: 0.8 ms
Lead Channel Setting Sensing Sensitivity: 0.3 mV

## 2019-11-03 ENCOUNTER — Telehealth: Payer: Self-pay

## 2019-11-03 NOTE — Telephone Encounter (Signed)
Remote ICM transmission received.  Attempted call to patient regarding ICM remote transmission and left detailed message per DPR.  Advised to return call for any fluid symptoms or questions. Next ICM remote transmission scheduled 12/04/2019.

## 2019-11-03 NOTE — Progress Notes (Signed)
Remote ICD transmission.   

## 2019-11-03 NOTE — Progress Notes (Signed)
EPIC Encounter for ICM Monitoring  Patient Name: Joanne Bell is a 59 y.o. female Date: 11/03/2019 Primary Care Physican: [name], MD Primary [place] Electrophysiologist: Vergie Living Pacing: 99% Lastweight: 153 lbs  Attempted call to patient and unable to reach.  Left detailed message per DPR regarding transmission. Transmission reviewed.   Optivol thoracic impedance normal.  Prescribed:No diuretic  Recommendations:Left voice mail with ICM number and encouraged to call if experiencing any fluid symptoms.  Follow-up plan: ICM clinic phone appointment on9/27/2021. 91 day device clinic remote transmission11/22/2021.   EP/Cardiology Office Visits: 12/16/2019 with Dr. [name].    Copy of ICM check sent to Dr. [name].    3 month ICM trend: 10/30/2019    1 Year ICM trend:       Joanne Billings, RN 11/03/2019 10:14 AM

## 2019-12-04 ENCOUNTER — Ambulatory Visit (INDEPENDENT_AMBULATORY_CARE_PROVIDER_SITE_OTHER): Payer: BC Managed Care – PPO

## 2019-12-04 DIAGNOSIS — Z95810 Presence of automatic (implantable) cardiac defibrillator: Secondary | ICD-10-CM

## 2019-12-04 DIAGNOSIS — I5022 Chronic systolic (congestive) heart failure: Secondary | ICD-10-CM | POA: Diagnosis not present

## 2019-12-06 NOTE — Progress Notes (Signed)
EPIC Encounter for ICM Monitoring  Patient Name: Joanne Bell is a 59 y.o. female Date: 12/06/2019 Primary Care Physican: [name], MD Primary [place] Electrophysiologist: Vergie Living Pacing: 99.3% Lastweight: 153 lbs  Transmission reviewed.   Optivol thoracic impedance normal.  Prescribed:No diuretic  Recommendations:No changes  Follow-up plan: ICM clinic phone appointment on11/03/2019. 91 day device clinic remote transmission11/22/2021.   EP/Cardiology Office Visits: Recall for10/11/2019 with Dr.Klein.   Copy of ICM check sent to [place].    3 month ICM trend: 12/04/2019    1 Year ICM trend:       Joanne Billings, RN 12/06/2019 11:16 AM

## 2019-12-11 DIAGNOSIS — L219 Seborrheic dermatitis, unspecified: Secondary | ICD-10-CM | POA: Diagnosis not present

## 2019-12-11 DIAGNOSIS — L988 Other specified disorders of the skin and subcutaneous tissue: Secondary | ICD-10-CM | POA: Diagnosis not present

## 2020-01-08 ENCOUNTER — Ambulatory Visit (INDEPENDENT_AMBULATORY_CARE_PROVIDER_SITE_OTHER): Payer: BC Managed Care – PPO

## 2020-01-08 DIAGNOSIS — Z95810 Presence of automatic (implantable) cardiac defibrillator: Secondary | ICD-10-CM

## 2020-01-08 DIAGNOSIS — I5022 Chronic systolic (congestive) heart failure: Secondary | ICD-10-CM

## 2020-01-12 NOTE — Progress Notes (Signed)
EPIC Encounter for ICM Monitoring  Patient Name: Joanne Bell is a 59 y.o. female Date: 01/12/2020 Primary Care Physican: [name], MD Primary [place] Electrophysiologist: Vergie Living Pacing: 99.3% Lastweight: 153 lbs  Transmission reviewed.  Optivol thoracic impedance normal.  Prescribed:No diuretic  Recommendations:No changes  Follow-up plan: ICM clinic phone appointment on12/08/2019. 91 day device clinic remote transmission11/22/2021.   EP/Cardiology Office Visits: Recall for10/11/2019 with Dr.Klein.   Copy of ICM check sent to [place].     3 month ICM trend: 01/08/2020    1 Year ICM trend:       Joanne Billings, RN 01/12/2020 3:14 PM

## 2020-01-22 ENCOUNTER — Ambulatory Visit: Payer: BC Managed Care – PPO | Admitting: Family Medicine

## 2020-01-22 ENCOUNTER — Other Ambulatory Visit: Payer: Self-pay

## 2020-01-22 ENCOUNTER — Encounter: Payer: Self-pay | Admitting: Family Medicine

## 2020-01-22 VITALS — BP 134/76 | HR 60 | Temp 98.4°F | Ht 64.0 in | Wt 152.6 lb

## 2020-01-22 DIAGNOSIS — M109 Gout, unspecified: Secondary | ICD-10-CM

## 2020-01-22 MED ORDER — COLCHICINE 0.6 MG PO TABS: 1.2000 mg | ORAL_TABLET | Freq: Four times a day (QID) | ORAL | 0 refills | Status: DC | PRN

## 2020-01-22 NOTE — Progress Notes (Signed)
   Subjective:    Patient ID: Joanne Bell, female    DOB: [birth year], 59 y.o.   MRN: [id number]  HPI Here for the sudden onset of severe pain and swelling in the right great toe 3 days ago. No hx of trauma. Tylenol does not help at all. She has never had this before.    Review of Systems  Constitutional: Negative.   Respiratory: Negative.   Cardiovascular: Negative.   Musculoskeletal: Positive for arthralgias.       Objective:   Physical Exam Constitutional:      Appearance: Normal appearance.     Comments: Limping   Cardiovascular:     Rate and Rhythm: Normal rate and regular rhythm.     Pulses: Normal pulses.     Heart sounds: Normal heart sounds.  Pulmonary:     Effort: Pulmonary effort is normal.     Breath sounds: Normal breath sounds.  Musculoskeletal:     Comments: The right first MTP joint is swollen and very tender. Not warm or red   Neurological:     Mental Status: She is alert.           Assessment & Plan:  Gout. Treat with Colchicine. The next time she has a blood draw we will check a uric acid level. Alysia Penna, MD

## 2020-01-29 ENCOUNTER — Ambulatory Visit (INDEPENDENT_AMBULATORY_CARE_PROVIDER_SITE_OTHER): Payer: BC Managed Care – PPO

## 2020-01-29 DIAGNOSIS — I5022 Chronic systolic (congestive) heart failure: Secondary | ICD-10-CM

## 2020-01-29 LAB — CUP PACEART REMOTE DEVICE CHECK
Battery Remaining Longevity: 27 mo
Battery Voltage: 2.94 V
Brady Statistic AP VP Percent: 4.35 %
Brady Statistic AP VS Percent: 0.01 %
Brady Statistic AS VP Percent: 95.64 %
Brady Statistic AS VS Percent: 0.01 %
Brady Statistic RA Percent Paced: 4.35 %
Brady Statistic RV Percent Paced: 99.24 %
Date Time Interrogation Session: 20211122033622
HighPow Impedance: 56 Ohm
Implantable Lead Implant Date: 20121210
Implantable Lead Implant Date: 20170512
Implantable Lead Implant Date: 20170512
Implantable Lead Location: 753858
Implantable Lead Location: 753859
Implantable Lead Location: 753860
Implantable Lead Model: 4598
Implantable Pulse Generator Implant Date: 20170512
Lead Channel Impedance Value: 342 Ohm
Lead Channel Impedance Value: 342 Ohm
Lead Channel Impedance Value: 361 Ohm
Lead Channel Impedance Value: 418 Ohm
Lead Channel Impedance Value: 418 Ohm
Lead Channel Impedance Value: 456 Ohm
Lead Channel Impedance Value: 513 Ohm
Lead Channel Impedance Value: 513 Ohm
Lead Channel Impedance Value: 589 Ohm
Lead Channel Impedance Value: 608 Ohm
Lead Channel Impedance Value: 722 Ohm
Lead Channel Impedance Value: 722 Ohm
Lead Channel Impedance Value: 779 Ohm
Lead Channel Pacing Threshold Amplitude: 0.5 V
Lead Channel Pacing Threshold Amplitude: 0.5 V
Lead Channel Pacing Threshold Amplitude: 1.375 V
Lead Channel Pacing Threshold Pulse Width: 0.4 ms
Lead Channel Pacing Threshold Pulse Width: 0.4 ms
Lead Channel Pacing Threshold Pulse Width: 0.8 ms
Lead Channel Sensing Intrinsic Amplitude: 11.25 mV
Lead Channel Sensing Intrinsic Amplitude: 11.25 mV
Lead Channel Sensing Intrinsic Amplitude: 3.375 mV
Lead Channel Sensing Intrinsic Amplitude: 3.375 mV
Lead Channel Setting Pacing Amplitude: 1.5 V
Lead Channel Setting Pacing Amplitude: 2 V
Lead Channel Setting Pacing Amplitude: 2.5 V
Lead Channel Setting Pacing Pulse Width: 0.4 ms
Lead Channel Setting Pacing Pulse Width: 0.8 ms
Lead Channel Setting Sensing Sensitivity: 0.3 mV

## 2020-01-31 NOTE — Progress Notes (Signed)
Remote ICD transmission.   

## 2020-02-03 DIAGNOSIS — Z20822 Contact with and (suspected) exposure to covid-19: Secondary | ICD-10-CM | POA: Diagnosis not present

## 2020-02-12 ENCOUNTER — Ambulatory Visit (INDEPENDENT_AMBULATORY_CARE_PROVIDER_SITE_OTHER): Payer: BC Managed Care – PPO

## 2020-02-12 DIAGNOSIS — I5022 Chronic systolic (congestive) heart failure: Secondary | ICD-10-CM | POA: Diagnosis not present

## 2020-02-12 DIAGNOSIS — Z95810 Presence of automatic (implantable) cardiac defibrillator: Secondary | ICD-10-CM | POA: Diagnosis not present

## 2020-02-14 ENCOUNTER — Telehealth: Payer: Self-pay

## 2020-02-14 NOTE — Progress Notes (Signed)
EPIC Encounter for ICM Monitoring  Patient Name: Joanne Bell is a 59 y.o. female Date: 02/14/2020 Primary Care Physican: [name], MD Primary [place] Electrophysiologist: Vergie Living Pacing: 99.4% Lastweight: 153 lbs  Attempted call to patient and unable to reach.  Left detailed message per DPR regarding transmission. Transmission reviewed.   Optivol thoracic impedance normal.  Prescribed:No diuretic  Recommendations:Left voice mail with ICM number and encouraged to call if experiencing any fluid symptoms.  Follow-up plan: ICM clinic phone appointment on1/12/2020. 91 day device clinic remote transmission11/22/2021.   EP/Cardiology Office Visits:Recall for10/11/2019 with Dr.Klein.   Copy of ICM check sent to [place].  3 month ICM trend: 02/12/2020    1 Year ICM trend:       [name], RN 02/14/2020 3:26 PM

## 2020-02-14 NOTE — Telephone Encounter (Signed)
Remote ICM transmission received.  Attempted call to patient regarding ICM remote transmission and left detailed message per DPR.  Advised to return call for any fluid symptoms or questions. Next ICM remote transmission scheduled 03/18/2020.

## 2020-02-27 ENCOUNTER — Other Ambulatory Visit: Payer: Self-pay

## 2020-02-27 MED ORDER — ENTRESTO 97-103 MG PO TABS: 0.5000 | ORAL_TABLET | Freq: Two times a day (BID) | ORAL | 0 refills | Status: DC

## 2020-03-18 ENCOUNTER — Ambulatory Visit (INDEPENDENT_AMBULATORY_CARE_PROVIDER_SITE_OTHER): Payer: BC Managed Care – PPO

## 2020-03-18 DIAGNOSIS — I5022 Chronic systolic (congestive) heart failure: Secondary | ICD-10-CM

## 2020-03-18 DIAGNOSIS — Z95810 Presence of automatic (implantable) cardiac defibrillator: Secondary | ICD-10-CM

## 2020-03-20 NOTE — Progress Notes (Signed)
EPIC Encounter for ICM Monitoring  Patient Name: Joanne Bell is a 60 y.o. female Date: 03/20/2020 Primary Care Physican: [name], MD Primary [place] Electrophysiologist: Vergie Living Pacing: 97.9% Lastweight: 153 lbs  Transmission reviewed.   Optivol thoracic impedance normal.  Prescribed:No diuretic  Recommendations:No changes  Follow-up plan: ICM clinic phone appointment on2/22/2022. 91 day device clinic remote transmission2/21/2022.   EP/Cardiology Office Visits: Message sent to EP scheduler 03/20/20 to contact patient to schedule overdue appointment with [name] (Recall for10/11/2019).   Copy of ICM check sent to [place].  3 month ICM trend: 03/18/2020.    1 Year ICM trend:       [name], RN 03/20/2020 1:13 PM

## 2020-04-29 ENCOUNTER — Ambulatory Visit (INDEPENDENT_AMBULATORY_CARE_PROVIDER_SITE_OTHER): Payer: BC Managed Care – PPO

## 2020-04-29 DIAGNOSIS — I442 Atrioventricular block, complete: Secondary | ICD-10-CM | POA: Diagnosis not present

## 2020-04-30 ENCOUNTER — Ambulatory Visit (INDEPENDENT_AMBULATORY_CARE_PROVIDER_SITE_OTHER): Payer: BC Managed Care – PPO

## 2020-04-30 DIAGNOSIS — I5022 Chronic systolic (congestive) heart failure: Secondary | ICD-10-CM | POA: Diagnosis not present

## 2020-04-30 DIAGNOSIS — Z95810 Presence of automatic (implantable) cardiac defibrillator: Secondary | ICD-10-CM | POA: Diagnosis not present

## 2020-04-30 LAB — CUP PACEART REMOTE DEVICE CHECK
Battery Remaining Longevity: 25 mo
Battery Voltage: 2.94 V
Brady Statistic AP VP Percent: 10.43 %
Brady Statistic AP VS Percent: 0.01 %
Brady Statistic AS VP Percent: 89.56 %
Brady Statistic AS VS Percent: 0.01 %
Brady Statistic RA Percent Paced: 10.38 %
Brady Statistic RV Percent Paced: 97.94 %
Date Time Interrogation Session: 20220221022605
HighPow Impedance: 58 Ohm
Implantable Lead Implant Date: 20121210
Implantable Lead Implant Date: 20170512
Implantable Lead Implant Date: 20170512
Implantable Lead Location: 753858
Implantable Lead Location: 753859
Implantable Lead Location: 753860
Implantable Lead Model: 4598
Implantable Pulse Generator Implant Date: 20170512
Lead Channel Impedance Value: 342 Ohm
Lead Channel Impedance Value: 361 Ohm
Lead Channel Impedance Value: 361 Ohm
Lead Channel Impedance Value: 418 Ohm
Lead Channel Impedance Value: 418 Ohm
Lead Channel Impedance Value: 418 Ohm
Lead Channel Impedance Value: 475 Ohm
Lead Channel Impedance Value: 513 Ohm
Lead Channel Impedance Value: 608 Ohm
Lead Channel Impedance Value: 646 Ohm
Lead Channel Impedance Value: 779 Ohm
Lead Channel Impedance Value: 779 Ohm
Lead Channel Impedance Value: 817 Ohm
Lead Channel Pacing Threshold Amplitude: 0.5 V
Lead Channel Pacing Threshold Amplitude: 0.625 V
Lead Channel Pacing Threshold Amplitude: 1.25 V
Lead Channel Pacing Threshold Pulse Width: 0.4 ms
Lead Channel Pacing Threshold Pulse Width: 0.4 ms
Lead Channel Pacing Threshold Pulse Width: 0.8 ms
Lead Channel Sensing Intrinsic Amplitude: 11.25 mV
Lead Channel Sensing Intrinsic Amplitude: 11.25 mV
Lead Channel Sensing Intrinsic Amplitude: 3.125 mV
Lead Channel Sensing Intrinsic Amplitude: 3.125 mV
Lead Channel Setting Pacing Amplitude: 1.5 V
Lead Channel Setting Pacing Amplitude: 2 V
Lead Channel Setting Pacing Amplitude: 2.25 V
Lead Channel Setting Pacing Pulse Width: 0.4 ms
Lead Channel Setting Pacing Pulse Width: 0.8 ms
Lead Channel Setting Sensing Sensitivity: 0.3 mV

## 2020-05-03 ENCOUNTER — Other Ambulatory Visit: Payer: Self-pay | Admitting: Internal Medicine

## 2020-05-07 NOTE — Progress Notes (Signed)
Remote ICD transmission.   

## 2020-05-08 ENCOUNTER — Encounter: Payer: Self-pay | Admitting: Internal Medicine

## 2020-05-08 ENCOUNTER — Ambulatory Visit (INDEPENDENT_AMBULATORY_CARE_PROVIDER_SITE_OTHER): Payer: BC Managed Care – PPO | Admitting: Internal Medicine

## 2020-05-08 ENCOUNTER — Other Ambulatory Visit: Payer: Self-pay

## 2020-05-08 VITALS — BP 118/82 | HR 55 | Ht 64.0 in | Wt 152.4 lb

## 2020-05-08 DIAGNOSIS — I428 Other cardiomyopathies: Secondary | ICD-10-CM | POA: Diagnosis not present

## 2020-05-08 DIAGNOSIS — I5022 Chronic systolic (congestive) heart failure: Secondary | ICD-10-CM

## 2020-05-08 DIAGNOSIS — Z95810 Presence of automatic (implantable) cardiac defibrillator: Secondary | ICD-10-CM | POA: Diagnosis not present

## 2020-05-08 DIAGNOSIS — Z01419 Encounter for gynecological examination (general) (routine) without abnormal findings: Secondary | ICD-10-CM | POA: Diagnosis not present

## 2020-05-08 DIAGNOSIS — N898 Other specified noninflammatory disorders of vagina: Secondary | ICD-10-CM | POA: Diagnosis not present

## 2020-05-08 DIAGNOSIS — I442 Atrioventricular block, complete: Secondary | ICD-10-CM | POA: Diagnosis not present

## 2020-05-08 NOTE — Patient Instructions (Addendum)

## 2020-05-08 NOTE — Progress Notes (Signed)
Electrophysiology Office Note   Date:  05/08/2020   ID:  Tessah, Patchen [birth year], MRN [id number]  PCP:  [name], MD  Cardiologist:    Primary Electrophysiologist: sk   No chief complaint on file.    History of Present Illness: Joanne Bell is a 60 y.o. female who presents today for electrophysiology  followup for a pacemaker implanted December 2012 for complete heart block and with ongoing pacing, she developed cardiomyopathy and underwent CRT-D upgrade 2017  Her workup for underlying causes was negative including  MRI  echo Lyme titers and chest x-ray these were all normal. EF was normal by MRI 12/12   She has hx hypertension. This is much improved   The patient denies chest pain, shortness of breath, nocturnal dyspnea, orthopnea or peripheral edema.  There have been no palpitations, lightheadedness or syncope.         Date Cr K Hgb  5/17 0.94 3.7 11.5   9/19 0.84 4.3 11.0  10/20 0.96 4.3           DATE TEST EF   12/12 cMRI 68%   1/15 Echo   45 %   11/17 Echo   30-35 %   2/18 Echo  45-50%   9/19.  Echo  55%         Past Medical History:  Diagnosis Date  . Atrioventricular block, complete (HCC)    narrow QRS  . Bilateral ovarian cysts   . 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 Chronic systolic CHF (congestive heart failure), NYHA class 3 ([place])   . Colon polyps   . Fibroids, intramural   . Hypertension   . NICM (nonischemic cardiomyopathy) ([place]) 07/20/2015   Past Surgical History:  Procedure Laterality Date  . COLONOSCOPY  2010   benign polyps, repeat in 5 yrs  . ENDOMETRIAL ABLATION  2004  . EP IMPLANTABLE DEVICE N/A 07/19/2015   Procedure: Upgrade to [place] ICD ;  Surgeon: Deboraha Sprang, MD;  Location: [place] CV LAB;  Service: Cardiovascular;  Laterality: N/A;  . MYOMECTOMY  1998   per Dr. Garwin Brothers  . PERMANENT PACEMAKER INSERTION Left 02/16/2011   Procedure: PERMANENT  PACEMAKER INSERTION;  Surgeon: Deboraha Sprang, MD;  Location: [facility] CATH LAB;  Service: Cardiovascular;  Laterality: Left;     Current Outpatient Medications  Medication Sig Dispense Refill  . carvedilol (COREG) 25 MG tablet TAKE 1 TABLET BY MOUTH TWICE A DAY 180 tablet 1  . sacubitril-valsartan (ENTRESTO) 97-103 MG Take 0.5 tablets by mouth 2 (two) times daily. Please keep upcoming appt in March 2022 before anymore refills. Thank you Final Attempt 30 tablet 0  . vitamin E 100 UNIT capsule Take 100 Units by mouth daily.     No current facility-administered medications for this visit.    Allergies:   Prednisone, Acyclovir and related, Augmentin [amoxicillin-pot clavulanate], Clonidine derivatives, and Percocet [oxycodone-acetaminophen]    ROS:  Please see the history of present illness.   Otherwise, review of systems is positive for noen.   All other systems are reviewed and negative.    PHYSICAL EXAM: VS:  BP 118/82   Pulse (!) 55   Ht 5\' 4"  (1.626 m)   Wt 152 lb 6.4 oz (69.1 kg)   SpO2 98%   BMI 26.16 kg/m  , BMI Body mass index is 26.16 kg/m. Well developed and well nourished in no acute distress HENT normal Neck supple with JVP-flat Clear Device pocket well  healed; without hematoma or erythema.  There is no tethering  Regular rate and rhythm, no   murmur Abd-soft with active BS No Clubbing cyanosis   edema Skin-warm and dry A & Oriented  Grossly normal sensory and motor function  ECG sinus @ 55 17/15/47       ASSESSMENT AND PLAN:  Complete heart block  Hypertension  sleep disordered breathing and daytime somnolence  Cardiomyopathy-improved   CRT-D-Medtronic  [name] device was interrogated.  The information was reviewed.      Resolved cardiomyopathy    BP well controlled  Discussion re concern of ICD shocks   Reviewed the  Mortality benefit of CARE-HF and the potential of inactivating HV therapies and at generator replacement-replace with CRT-P.     Will plan prior to generator replacement >> echo  Continue current meds      Signed, [name], MD  05/08/2020 5:00 PM      [place] [address] [phone] (office) [phone] (fax)

## 2020-05-09 ENCOUNTER — Other Ambulatory Visit: Payer: Self-pay

## 2020-05-09 MED ORDER — CARVEDILOL 25 MG PO TABS: 25.0000 mg | ORAL_TABLET | Freq: Two times a day (BID) | ORAL | 3 refills | Status: DC

## 2020-05-10 NOTE — Progress Notes (Signed)
EPIC Encounter for ICM Monitoring  Patient Name: Joanne Bell is a 60 y.o. female Date: 05/10/2020 Primary Care Physican: [name], MD Primary [place] Electrophysiologist: Vergie Living Pacing: 97.9% Lastweight: 153 lbs  Transmission reviewed.  Optivol thoracic impedance normal.  Prescribed:No diuretic  Recommendations:No changes  Follow-up plan: ICM clinic phone appointment on4/06/2020. 91 day device clinic remote transmission 07/29/2020.   EP/Cardiology Office Visits: Message sent to EP scheduler 03/20/20 to contact patient to schedule overdue appointment with [name] (Recall for10/11/2019).   Copy of ICM check sent to [place].  3 month ICM trend: 04/29/2020.    1 Year ICM trend:   [name], RN 05/10/2020 10:32 AM

## 2020-05-14 ENCOUNTER — Other Ambulatory Visit: Payer: Self-pay

## 2020-05-15 ENCOUNTER — Encounter: Payer: Self-pay | Admitting: Family Medicine

## 2020-05-15 ENCOUNTER — Ambulatory Visit: Payer: BC Managed Care – PPO | Admitting: Family Medicine

## 2020-05-15 VITALS — BP 110/78 | HR 95 | Temp 98.1°F | Wt 154.2 lb

## 2020-05-15 DIAGNOSIS — M546 Pain in thoracic spine: Secondary | ICD-10-CM

## 2020-05-15 MED ORDER — CYCLOBENZAPRINE HCL 10 MG PO TABS: 10.0000 mg | ORAL_TABLET | Freq: Three times a day (TID) | ORAL | 1 refills | Status: DC | PRN

## 2020-05-15 MED ORDER — DICLOFENAC SODIUM 75 MG PO TBEC: 75.0000 mg | DELAYED_RELEASE_TABLET | Freq: Two times a day (BID) | ORAL | 1 refills | Status: DC

## 2020-05-15 NOTE — Progress Notes (Signed)
   Subjective:    Patient ID: Joanne Bell, female    DOB: 09/16/1960, 60 y.o.   MRN: [id number]  HPI Here for 3 days of a sharp pain in the right upper back around the shoulder blade. No recent trauma. No SOB. Taking Aleve helps.    Review of Systems  Constitutional: Negative.   Respiratory: Negative.   Cardiovascular: Negative.   Musculoskeletal: Positive for back pain.       Objective:   Physical Exam Constitutional:      General: She is not in acute distress.    Appearance: Normal appearance.  Cardiovascular:     Rate and Rhythm: Normal rate and regular rhythm.     Pulses: Normal pulses.     Heart sounds: Normal heart sounds.  Pulmonary:     Effort: Pulmonary effort is normal.     Breath sounds: Normal breath sounds.  Musculoskeletal:     Comments: Mildly tender just inferior to the right scapula, full ROM  Neurological:     Mental Status: She is alert.           Assessment & Plan:  Thoracic pain, use heat, Flexeril, and Diclofenac as needed. Recheck prn.  Alysia Penna, MD

## 2020-05-20 ENCOUNTER — Other Ambulatory Visit: Payer: Self-pay | Admitting: Obstetrics and Gynecology

## 2020-05-20 DIAGNOSIS — Z13820 Encounter for screening for osteoporosis: Secondary | ICD-10-CM

## 2020-05-20 DIAGNOSIS — T148XXA Other injury of unspecified body region, initial encounter: Secondary | ICD-10-CM

## 2020-06-03 ENCOUNTER — Other Ambulatory Visit: Payer: Self-pay | Admitting: Internal Medicine

## 2020-06-04 MED ORDER — ENTRESTO 97-103 MG PO TABS: 0.5000 | ORAL_TABLET | Freq: Two times a day (BID) | ORAL | 3 refills | Status: DC

## 2020-06-05 ENCOUNTER — Other Ambulatory Visit: Payer: Self-pay

## 2020-06-06 ENCOUNTER — Ambulatory Visit: Payer: BC Managed Care – PPO | Admitting: Family Medicine

## 2020-06-06 ENCOUNTER — Encounter: Payer: Self-pay | Admitting: Family Medicine

## 2020-06-06 VITALS — BP 126/78 | HR 62 | Temp 98.5°F | Wt 153.0 lb

## 2020-06-06 DIAGNOSIS — M67979 Unspecified disorder of synovium and tendon, unspecified ankle and foot: Secondary | ICD-10-CM | POA: Diagnosis not present

## 2020-06-06 NOTE — Progress Notes (Signed)
   Subjective:    Patient ID: Joanne Bell, female    DOB: 28-Jan-1961, 60 y.o.   MRN: [id number]  HPI Here for one year of intermittent pain in the backs of both heels. This usually starts when she stands up to walk from sitting a long time or when she gets out of bed. After she moves around a bit, the pain eases off. There is no swelling or warmth. She does not take medication for this. When she is moving around her house, she goes barefoot. Most of the time she wears flats or heels at her job.    Review of Systems  Constitutional: Negative.   Respiratory: Negative.   Cardiovascular: Negative.   Musculoskeletal: Positive for arthralgias.       Objective:   Physical Exam Constitutional:      Appearance: Normal appearance.     Comments: She walks easily   Cardiovascular:     Rate and Rhythm: Normal rate and regular rhythm.     Pulses: Normal pulses.     Heart sounds: Normal heart sounds.  Pulmonary:     Effort: Pulmonary effort is normal.     Breath sounds: Normal breath sounds.  Musculoskeletal:     Comments: The feet and ankles appear normal with no swelling or erythema. The Achilles tendons are not tender, but she has pain on dorsiflexion in the area where the Achilles insert onto the calcanei    Neurological:     Mental Status: She is alert.           Assessment & Plan:  She has tight Achilles tendons. First I asked her to wear only supportive athletic shoes (Nike, etc) around the house and at work. She will avoid going barefoot. I also showed her how to do stretching exercises for the Achilles 4-6 times daily. She can apply moist heat or soak her feet in hot water as needed. Recheck if not better in 30 days.  Alysia Penna, MD

## 2020-06-10 ENCOUNTER — Ambulatory Visit (INDEPENDENT_AMBULATORY_CARE_PROVIDER_SITE_OTHER): Payer: BC Managed Care – PPO

## 2020-06-10 DIAGNOSIS — Z95810 Presence of automatic (implantable) cardiac defibrillator: Secondary | ICD-10-CM | POA: Diagnosis not present

## 2020-06-10 DIAGNOSIS — I5022 Chronic systolic (congestive) heart failure: Secondary | ICD-10-CM

## 2020-06-12 NOTE — Progress Notes (Signed)
EPIC Encounter for ICM Monitoring  Patient Name: Joanne Bell is a 60 y.o. female Date: 06/12/2020 Primary Care Physican: [name], MD Primary [place] Electrophysiologist: Vergie Living Pacing: 99.7% 05/08/2020 office weight: 152 lbs  Transmission reviewed.  Optivol thoracic impedance normal.  Prescribed:No diuretic  Recommendations:No changes  Follow-up plan: ICM clinic phone appointment on5/11/2020. 91 day device clinic remote transmission 07/29/2020.   EP/Cardiology Office Visits: Recall 05/12/2021 with [name].   Copy of ICM check sent to [facility].  3 month ICM trend: 06/12/2020.    1 Year ICM trend:       [name], RN 06/12/2020 1:44 PM

## 2020-06-17 ENCOUNTER — Encounter: Payer: Self-pay | Admitting: Family Medicine

## 2020-06-17 ENCOUNTER — Telehealth (INDEPENDENT_AMBULATORY_CARE_PROVIDER_SITE_OTHER): Payer: BC Managed Care – PPO | Admitting: Family Medicine

## 2020-06-17 DIAGNOSIS — J019 Acute sinusitis, unspecified: Secondary | ICD-10-CM | POA: Diagnosis not present

## 2020-06-17 MED ORDER — LEVOFLOXACIN 500 MG PO TABS: 500.0000 mg | ORAL_TABLET | Freq: Every day | ORAL | 0 refills | Status: AC

## 2020-06-17 NOTE — Progress Notes (Signed)
Subjective:    Patient ID: Joanne Bell, female    DOB: 02-05-1961, 60 y.o.   MRN: [id number]  HPI Virtual Visit via Video Note  I connected with the patient on 06/17/20 at  1:45 PM EDT by a video enabled telemedicine application and verified that I am speaking with the correct person using two identifiers.  Location patient: home Location provider:work or home office Persons participating in the virtual visit: patient, provider  I discussed the limitations of evaluation and management by telemedicine and the availability of in person appointments. The patient expressed understanding and agreed to proceed.   HPI: Here for 4 days of sinus pressure, blowing yellow mucus from the nose, and headache. No cough or ST or fever. She tested negative for the Covid virus 3 days ago. Taking Mucinex D.    ROS: See pertinent positives and negatives per HPI.  Past Medical History:  Diagnosis Date  . Atrioventricular block, complete (HCC)    narrow QRS  . Bilateral ovarian cysts   . 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 Chronic systolic CHF (congestive heart failure), NYHA class 3 ([place])   . Colon polyps   . Fibroids, intramural   . Hypertension   . NICM (nonischemic cardiomyopathy) ([place]) 07/20/2015    Past Surgical History:  Procedure Laterality Date  . COLONOSCOPY  2010   benign polyps, repeat in 5 yrs  . ENDOMETRIAL ABLATION  2004  . EP IMPLANTABLE DEVICE N/A 07/19/2015   Procedure: Upgrade to [place] ICD ;  Surgeon: Deboraha Sprang, MD;  Location: [facility];  Service: Cardiovascular;  Laterality: N/A;  . MYOMECTOMY  1998   per Dr. Garwin Brothers  . PERMANENT PACEMAKER INSERTION Left 02/16/2011   Procedure: PERMANENT PACEMAKER INSERTION;  Surgeon: Deboraha Sprang, MD;  Location: [facility] CATH [facility];  Service: Cardiovascular;  Laterality: Left;    Family History  Problem Relation Age of Onset  . Depression Mother   . Arthritis Father   .  Diabetes Father   . Hyperlipidemia Father   . Hypertension Father   . Stroke Father      Current Outpatient Medications:  .  carvedilol (COREG) 25 MG tablet, Take 1 tablet (25 mg total) by mouth 2 (two) times daily., Disp: 180 tablet, Rfl: 3 .  levofloxacin (LEVAQUIN) 500 MG tablet, Take 1 tablet (500 mg total) by mouth daily for 10 days., Disp: 10 tablet, Rfl: 0 .  sacubitril-valsartan (ENTRESTO) 97-103 MG, Take 0.5 tablets by mouth 2 (two) times daily., Disp: 90 tablet, Rfl: 3 .  vitamin E 100 UNIT capsule, Take 100 Units by mouth daily., Disp: , Rfl:   EXAM:  VITALS per patient if applicable:  GENERAL: alert, oriented, appears well and in no acute distress  HEENT: atraumatic, conjunttiva clear, no obvious abnormalities on inspection of external nose and ears  NECK: normal movements of the head and neck  LUNGS: on inspection no signs of respiratory distress, breathing rate appears normal, no obvious gross SOB, gasping or wheezing  CV: no obvious cyanosis  MS: moves all visible extremities without noticeable abnormality  PSYCH/NEURO: pleasant and cooperative, no obvious depression or anxiety, speech and thought processing grossly intact  ASSESSMENT AND PLAN: Sinusitis, treat with Levaquin.  Alysia Penna, MD  Discussed the following assessment and plan:  No diagnosis found.     I discussed the assessment and treatment plan with the patient. The patient was provided an opportunity to ask questions and all were answered. The patient  agreed with the plan and demonstrated an understanding of the instructions.   The patient was advised to call back or seek an in-person evaluation if the symptoms worsen or if the condition fails to improve as anticipated.     Review of Systems     Objective:   Physical Exam        Assessment & Plan:

## 2020-07-15 ENCOUNTER — Ambulatory Visit (INDEPENDENT_AMBULATORY_CARE_PROVIDER_SITE_OTHER): Payer: BC Managed Care – PPO

## 2020-07-15 DIAGNOSIS — I5022 Chronic systolic (congestive) heart failure: Secondary | ICD-10-CM

## 2020-07-15 DIAGNOSIS — Z95810 Presence of automatic (implantable) cardiac defibrillator: Secondary | ICD-10-CM

## 2020-07-15 NOTE — Progress Notes (Signed)
EPIC Encounter for ICM Monitoring  Patient Name: Joanne Bell is a 60 y.o. female Date: 07/15/2020 Primary Care Physican: [name], MD Primary [place] Electrophysiologist: Vergie Living Pacing: 99.8% 05/08/2020 office weight: 152 lbs  Transmission reviewed.  Optivol thoracic impedance normal.  Prescribed:No diuretic  Recommendations:No changes  Follow-up plan: ICM clinic phone appointment on6/20/2022. 91 day device clinic remote transmission5/23/2022.   EP/Cardiology Office Visits: Recall 05/12/2021 with [name].   Copy of ICM check sent to [facility].  3 month ICM trend: 07/15/2020.    1 Year ICM trend:       Joanne Billings, RN 07/15/2020 4:38 PM

## 2020-07-29 ENCOUNTER — Ambulatory Visit (INDEPENDENT_AMBULATORY_CARE_PROVIDER_SITE_OTHER): Payer: BC Managed Care – PPO

## 2020-07-29 DIAGNOSIS — I442 Atrioventricular block, complete: Secondary | ICD-10-CM

## 2020-07-30 LAB — CUP PACEART REMOTE DEVICE CHECK
Battery Remaining Longevity: 23 mo
Battery Voltage: 2.93 V
Brady Statistic AP VP Percent: 1.78 %
Brady Statistic AP VS Percent: 0.01 %
Brady Statistic AS VP Percent: 98.2 %
Brady Statistic AS VS Percent: 0.01 %
Brady Statistic RA Percent Paced: 1.79 %
Brady Statistic RV Percent Paced: 99.4 %
Date Time Interrogation Session: 20220523043724
HighPow Impedance: 56 Ohm
Implantable Lead Implant Date: 20121210
Implantable Lead Implant Date: 20170512
Implantable Lead Implant Date: 20170512
Implantable Lead Location: 753858
Implantable Lead Location: 753859
Implantable Lead Location: 753860
Implantable Lead Model: 4598
Implantable Pulse Generator Implant Date: 20170512
Lead Channel Impedance Value: 342 Ohm
Lead Channel Impedance Value: 361 Ohm
Lead Channel Impedance Value: 361 Ohm
Lead Channel Impedance Value: 399 Ohm
Lead Channel Impedance Value: 418 Ohm
Lead Channel Impedance Value: 456 Ohm
Lead Channel Impedance Value: 475 Ohm
Lead Channel Impedance Value: 532 Ohm
Lead Channel Impedance Value: 608 Ohm
Lead Channel Impedance Value: 646 Ohm
Lead Channel Impedance Value: 760 Ohm
Lead Channel Impedance Value: 779 Ohm
Lead Channel Impedance Value: 779 Ohm
Lead Channel Pacing Threshold Amplitude: 0.5 V
Lead Channel Pacing Threshold Amplitude: 0.625 V
Lead Channel Pacing Threshold Amplitude: 1.125 V
Lead Channel Pacing Threshold Pulse Width: 0.4 ms
Lead Channel Pacing Threshold Pulse Width: 0.4 ms
Lead Channel Pacing Threshold Pulse Width: 0.8 ms
Lead Channel Sensing Intrinsic Amplitude: 11.25 mV
Lead Channel Sensing Intrinsic Amplitude: 11.25 mV
Lead Channel Sensing Intrinsic Amplitude: 2.875 mV
Lead Channel Sensing Intrinsic Amplitude: 2.875 mV
Lead Channel Setting Pacing Amplitude: 1.5 V
Lead Channel Setting Pacing Amplitude: 2 V
Lead Channel Setting Pacing Amplitude: 2.25 V
Lead Channel Setting Pacing Pulse Width: 0.4 ms
Lead Channel Setting Pacing Pulse Width: 0.8 ms
Lead Channel Setting Sensing Sensitivity: 0.3 mV

## 2020-08-20 NOTE — Progress Notes (Signed)
Remote ICD transmission.   

## 2020-08-26 ENCOUNTER — Ambulatory Visit (INDEPENDENT_AMBULATORY_CARE_PROVIDER_SITE_OTHER): Payer: BC Managed Care – PPO

## 2020-08-26 DIAGNOSIS — I5022 Chronic systolic (congestive) heart failure: Secondary | ICD-10-CM | POA: Diagnosis not present

## 2020-08-26 DIAGNOSIS — Z95810 Presence of automatic (implantable) cardiac defibrillator: Secondary | ICD-10-CM | POA: Diagnosis not present

## 2020-08-28 NOTE — Progress Notes (Signed)
EPIC Encounter for ICM Monitoring  Patient Name: Joanne Bell is a 60 y.o. female Date: 08/28/2020 Primary Care Physican: [name], MD Primary Cardiologist: [name] Electrophysiologist: Vergie Living Pacing:  99%    05/08/2020 office weight: 152 lbs                                                          Transmission reviewed.    Optivol thoracic impedance normal.    Prescribed: No diuretic   Recommendations:  No changes   Follow-up plan: ICM clinic phone appointment on 09/30/2020.  91 day device clinic remote transmission 10/28/2020.     EP/Cardiology Office Visits:  Recall 05/12/2021 with [name].     Copy of ICM check sent to Dr. [name].  3 month ICM trend: 08/26/2020.    1 Year ICM trend:       [name], RN 08/28/2020 11:29 AM

## 2020-09-17 ENCOUNTER — Other Ambulatory Visit: Payer: Self-pay

## 2020-09-18 ENCOUNTER — Encounter: Payer: Self-pay | Admitting: Family Medicine

## 2020-09-18 ENCOUNTER — Ambulatory Visit: Payer: BC Managed Care – PPO | Admitting: Family Medicine

## 2020-09-18 VITALS — BP 118/78 | HR 63 | Temp 98.7°F | Wt 153.2 lb

## 2020-09-18 DIAGNOSIS — M79671 Pain in right foot: Secondary | ICD-10-CM | POA: Diagnosis not present

## 2020-09-18 DIAGNOSIS — M79672 Pain in left foot: Secondary | ICD-10-CM

## 2020-09-18 NOTE — Progress Notes (Addendum)
   Subjective:    Patient ID: Joanne Bell, female    DOB: 1960/03/16, 60 y.o.   MRN: [id number]  HPI Here for 4 months of pain in both feet, the right worse than the left. She saw [place] 3 months ago when this started in the Achilles tendons, but now it has spread ot the heels as well. She wears supportive shoes and she has tried Diclofenac BID, but this has not helped. No swelling or redness.    Review of Systems  Constitutional: Negative.   Respiratory: Negative.    Cardiovascular: Negative.   Musculoskeletal:  Positive for arthralgias.      Objective:   Physical Exam Constitutional:      General: She is not in acute distress.    Appearance: Normal appearance.  Cardiovascular:     Rate and Rhythm: Normal rate and regular rhythm.     Pulses: Normal pulses.     Heart sounds: Normal heart sounds.  Pulmonary:     Effort: Pulmonary effort is normal.     Breath sounds: Normal breath sounds.  Musculoskeletal:     Comments: Both feet and ankles are normal on exam   Neurological:     Mental Status: She is alert.          Assessment & Plan:  Foot pain, we will refer her to Podiatry for further evaluation. She may benefit from molded inserts.  Alysia Penna, MD

## 2020-09-27 ENCOUNTER — Ambulatory Visit: Payer: BC Managed Care – PPO | Admitting: Podiatry

## 2020-09-30 ENCOUNTER — Ambulatory Visit (INDEPENDENT_AMBULATORY_CARE_PROVIDER_SITE_OTHER): Payer: BC Managed Care – PPO

## 2020-09-30 ENCOUNTER — Telehealth (INDEPENDENT_AMBULATORY_CARE_PROVIDER_SITE_OTHER): Payer: BC Managed Care – PPO | Admitting: Family Medicine

## 2020-09-30 ENCOUNTER — Encounter: Payer: Self-pay | Admitting: Family Medicine

## 2020-09-30 VITALS — Ht 64.0 in | Wt 153.0 lb

## 2020-09-30 DIAGNOSIS — J0191 Acute recurrent sinusitis, unspecified: Secondary | ICD-10-CM

## 2020-09-30 DIAGNOSIS — I5022 Chronic systolic (congestive) heart failure: Secondary | ICD-10-CM | POA: Diagnosis not present

## 2020-09-30 DIAGNOSIS — Z95810 Presence of automatic (implantable) cardiac defibrillator: Secondary | ICD-10-CM

## 2020-09-30 MED ORDER — LEVOFLOXACIN 500 MG PO TABS: 500.0000 mg | ORAL_TABLET | Freq: Every day | ORAL | 0 refills | Status: AC

## 2020-09-30 NOTE — Progress Notes (Signed)
Subjective:    Patient ID: Joanne Bell, female    DOB: 1960/04/18, 60 y.o.   MRN: [id number]  HPI Virtual Visit via Video Note  I connected with the patient on 09/30/20 at  8:45 AM EDT by a video enabled telemedicine application and verified that I am speaking with the correct person using two identifiers.  Location patient: home Location provider:work or home office Persons participating in the virtual visit: patient, provider  I discussed the limitations of evaluation and management by telemedicine and the availability of in person appointments. The patient expressed understanding and agreed to proceed.   HPI: Here for 5 days sinus congestion, PND, headache, blowing yellow mucus from the nose, and a dry cough. No fever or body aches. She tested negative for the Covid virus a few days ago. Drinking fluids and taking Allegra.   ROS: See pertinent positives and negatives per HPI.  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 Garwin Brothers   PERMANENT PACEMAKER INSERTION Left 02/16/2011   Procedure: PERMANENT PACEMAKER INSERTION;  Surgeon: Deboraha Sprang, MD;  Location: [facility] CATH LAB;  Service: Cardiovascular;  Laterality: Left;    Family History  Problem Relation Age of Onset   Depression Mother     Arthritis Father    Diabetes Father    Hyperlipidemia Father    Hypertension Father    Stroke Father      Current Outpatient Medications:    carvedilol (COREG) 25 MG tablet, Take 1 tablet (25 mg total) by mouth 2 (two) times daily., Disp: 180 tablet, Rfl: 3   levofloxacin (LEVAQUIN) 500 MG tablet, Take 1 tablet (500 mg total) by mouth daily for 10 days., Disp: 10 tablet, Rfl: 0   sacubitril-valsartan (ENTRESTO) 97-103 MG, Take 0.5 tablets by mouth 2 (two) times daily., Disp: 90 tablet, Rfl: 3   vitamin E 100 UNIT capsule, Take 100 Units by mouth daily., Disp: , Rfl:   EXAM:  VITALS per patient if applicable:  GENERAL: alert, oriented, appears well and in no acute distress  HEENT: atraumatic, conjunttiva clear, no obvious abnormalities on inspection of external nose and ears  NECK: normal movements of the head and neck  LUNGS: on inspection no signs of respiratory distress, breathing rate appears normal, no obvious gross SOB, gasping or wheezing  CV: no obvious cyanosis  MS: moves all visible extremities without noticeable abnormality  PSYCH/NEURO: pleasant and cooperative, no obvious depression or anxiety, speech and thought processing grossly intact  ASSESSMENT AND PLAN: Sinusitis, treat with 10 days of Levaquin.  Alysia Penna, MD  Discussed the following assessment and plan:  No diagnosis found.     I discussed the assessment and treatment plan with the patient. The patient was provided an opportunity to ask  questions and all were answered. The patient agreed with the plan and demonstrated an understanding of the instructions.   The patient was advised to call back or seek an in-person evaluation if the symptoms worsen or if the condition fails to improve as anticipated.      Review of Systems     Objective:   Physical Exam        Assessment & Plan:

## 2020-10-01 NOTE — Progress Notes (Signed)
EPIC Encounter for ICM Monitoring  Patient Name: Joanne Bell is a 60 y.o. female Date: 10/01/2020 Primary Care Physican: [name], MD Primary Cardiologist: [name] Electrophysiologist: Vergie Living Pacing:  99%    09/18/2020 office weight: 153 lbs                                                          Transmission reviewed.    Optivol thoracic impedance normal.    Prescribed: No diuretic   Recommendations:  No changes   Follow-up plan: ICM clinic phone appointment on 11/04/2020.  91 day device clinic remote transmission 10/28/2020.     EP/Cardiology Office Visits:  Recall 05/12/2021 with [name].     Copy of ICM check sent to Dr. [name].  3 month ICM trend: 09/30/2020.    1 Year ICM trend:       [name], RN 10/01/2020 1:56 PM

## 2020-10-04 ENCOUNTER — Ambulatory Visit: Payer: BC Managed Care – PPO | Admitting: Podiatry

## 2020-10-18 ENCOUNTER — Ambulatory Visit (INDEPENDENT_AMBULATORY_CARE_PROVIDER_SITE_OTHER): Payer: BC Managed Care – PPO

## 2020-10-18 ENCOUNTER — Ambulatory Visit: Payer: BC Managed Care – PPO | Admitting: Podiatry

## 2020-10-18 ENCOUNTER — Other Ambulatory Visit: Payer: Self-pay | Admitting: Podiatry

## 2020-10-18 ENCOUNTER — Other Ambulatory Visit: Payer: Self-pay

## 2020-10-18 ENCOUNTER — Encounter: Payer: Self-pay | Admitting: Podiatry

## 2020-10-18 DIAGNOSIS — M7662 Achilles tendinitis, left leg: Secondary | ICD-10-CM

## 2020-10-18 DIAGNOSIS — M722 Plantar fascial fibromatosis: Secondary | ICD-10-CM

## 2020-10-18 DIAGNOSIS — M7661 Achilles tendinitis, right leg: Secondary | ICD-10-CM

## 2020-10-18 NOTE — Progress Notes (Signed)
Subjective:  Patient ID: Joanne Bell, female    DOB: 06/24/1960,  MRN: [id number]  Chief Complaint  Patient presents with   Foot Pain    PT states she has bilateral heel pain and achilles tendon pain     60 y.o. female presents with the above complaint.  Patient presents with complaint of bilateral Achilles tendon is right much worse than left side.  Patient states been on and off for years and has progressed to gotten worse.  This time is the worst.  Hurts after sitting and there is stiffness especially when ambulating.  She will has not tried any treatment options she has not seen anyone else prior to see me.  Pain scale is 8 out of 10 sharp shooting in nature.   Review of Systems: Negative except as noted in the HPI. Denies N/V/F/Ch.  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 0000000   Chronic systolic CHF (congestive heart failure), NYHA class 3 (HCC)    Colon polyps    Fibroids, intramural    Hypertension    NICM (nonischemic cardiomyopathy) ([place]) 07/20/2015    Current Outpatient Medications:    carvedilol (COREG) 25 MG tablet, Take 1 tablet (25 mg total) by mouth 2 (two) times daily., Disp: 180 tablet, Rfl: 3   sacubitril-valsartan (ENTRESTO) 97-103 MG, Take 0.5 tablets by mouth 2 (two) times daily., Disp: 90 tablet, Rfl: 3   vitamin E 100 UNIT capsule, Take 100 Units by mouth daily., Disp: , Rfl:   Social History   Tobacco Use  Smoking Status Never  Smokeless Tobacco Never    Allergies  Allergen Reactions   Prednisone     Insomnia, headache and sweats   Acyclovir And Related Other (See Comments)    Reaction unknown to patient   Augmentin [Amoxicillin-Pot Clavulanate] Nausea And Vomiting   Clonidine Derivatives Other (See Comments)    Pt states "it makes me irritable"   Percocet [Oxycodone-Acetaminophen]  Other (See Comments)    Dizziness, sweating, abdominal discomfort   Objective:  There were no vitals filed for this visit. There is no height or weight on file to calculate BMI. Constitutional Well developed. Well nourished.  Vascular Dorsalis pedis pulses palpable bilaterally. Posterior tibial pulses palpable bilaterally. Capillary refill normal to all digits.  No cyanosis or clubbing noted. Pedal hair growth normal.  Neurologic Normal speech. Oriented to person, place, and time. Epicritic sensation to light touch grossly present bilaterally.  Dermatologic Nails well groomed and normal in appearance. No open wounds. No skin lesions.  Orthopedic: Pain on palpation right Achilles tendinitis positive Silfverskiold test noted gastrocnemius equinus.  Pain at the insertion of the Achilles tendon pain with dorsiflexion of the ankle joint bilaterally.  Haglund's deformity clinically palpated.   Radiographs: 3 views of skeletally mature adult bilateral foot: No posterior plantar heel spurring noted.  Pes planovalgus foot structure noted. Assessment:   1. Achilles tendinitis, left leg   2. Achilles tendinitis, right leg    Plan:  Patient was evaluated and treated and all questions answered.  Bilateral Achilles tendinitis with mild Haglund's deformity and underlying gastrocnemius equinus right greater than left -I explained the patient the etiology of Achilles tendinitis and various treatment options were extensively discussed.  Given the amount of pain that she is having she will benefit from cam boot immobilization to the right side as tend to  be the more painful side.  The left side is likely due to compensation.  I discussed with her that in the future if there is no resolve meant noted there is residual pain we will discuss steroid injection at that time.  Patient states understanding. -Cam boot was dispensed to the right side  No follow-ups on file.

## 2020-10-28 ENCOUNTER — Ambulatory Visit (INDEPENDENT_AMBULATORY_CARE_PROVIDER_SITE_OTHER): Payer: BC Managed Care – PPO

## 2020-10-28 DIAGNOSIS — I428 Other cardiomyopathies: Secondary | ICD-10-CM | POA: Diagnosis not present

## 2020-10-28 LAB — CUP PACEART REMOTE DEVICE CHECK
Battery Remaining Longevity: 23 mo
Battery Voltage: 2.93 V
Brady Statistic AP VP Percent: 4.89 %
Brady Statistic AP VS Percent: 0.01 %
Brady Statistic AS VP Percent: 95.09 %
Brady Statistic AS VS Percent: 0.02 %
Brady Statistic RA Percent Paced: 4.89 %
Brady Statistic RV Percent Paced: 99.06 %
Date Time Interrogation Session: 20220822031706
HighPow Impedance: 51 Ohm
Implantable Lead Implant Date: 20121210
Implantable Lead Implant Date: 20170512
Implantable Lead Implant Date: 20170512
Implantable Lead Location: 753858
Implantable Lead Location: 753859
Implantable Lead Location: 753860
Implantable Lead Model: 4598
Implantable Pulse Generator Implant Date: 20170512
Lead Channel Impedance Value: 304 Ohm
Lead Channel Impedance Value: 342 Ohm
Lead Channel Impedance Value: 342 Ohm
Lead Channel Impedance Value: 399 Ohm
Lead Channel Impedance Value: 399 Ohm
Lead Channel Impedance Value: 456 Ohm
Lead Channel Impedance Value: 456 Ohm
Lead Channel Impedance Value: 475 Ohm
Lead Channel Impedance Value: 551 Ohm
Lead Channel Impedance Value: 551 Ohm
Lead Channel Impedance Value: 703 Ohm
Lead Channel Impedance Value: 703 Ohm
Lead Channel Impedance Value: 722 Ohm
Lead Channel Pacing Threshold Amplitude: 0.5 V
Lead Channel Pacing Threshold Amplitude: 0.625 V
Lead Channel Pacing Threshold Amplitude: 1.25 V
Lead Channel Pacing Threshold Pulse Width: 0.4 ms
Lead Channel Pacing Threshold Pulse Width: 0.4 ms
Lead Channel Pacing Threshold Pulse Width: 0.8 ms
Lead Channel Sensing Intrinsic Amplitude: 11.25 mV
Lead Channel Sensing Intrinsic Amplitude: 11.25 mV
Lead Channel Sensing Intrinsic Amplitude: 2.5 mV
Lead Channel Sensing Intrinsic Amplitude: 2.5 mV
Lead Channel Setting Pacing Amplitude: 1.5 V
Lead Channel Setting Pacing Amplitude: 2 V
Lead Channel Setting Pacing Amplitude: 2.5 V
Lead Channel Setting Pacing Pulse Width: 0.4 ms
Lead Channel Setting Pacing Pulse Width: 0.8 ms
Lead Channel Setting Sensing Sensitivity: 0.3 mV

## 2020-11-04 ENCOUNTER — Ambulatory Visit (INDEPENDENT_AMBULATORY_CARE_PROVIDER_SITE_OTHER): Payer: BC Managed Care – PPO

## 2020-11-04 ENCOUNTER — Telehealth: Payer: Self-pay

## 2020-11-04 DIAGNOSIS — I5022 Chronic systolic (congestive) heart failure: Secondary | ICD-10-CM

## 2020-11-04 DIAGNOSIS — Z95810 Presence of automatic (implantable) cardiac defibrillator: Secondary | ICD-10-CM

## 2020-11-04 NOTE — Telephone Encounter (Signed)
Remote ICM transmission received.  Attempted call to patient regarding ICM remote transmission and left detailed message per DPR.  Advised to return call for any fluid symptoms or questions. Next ICM remote transmission scheduled 11/18/2020.

## 2020-11-04 NOTE — Progress Notes (Signed)
EPIC Encounter for ICM Monitoring  Patient Name: Zohara Mcgreevy is a 60 y.o. female Date: 11/04/2020 Primary Care Physican: [name], MD Primary Cardiologist: [name] Electrophysiologist: Vergie Living Pacing:  98.5%    09/18/2020 office weight: 153 lbs                                                          Attempted call to patient and unable to reach.  Left detailed message per DPR regarding transmission. Transmission reviewed.    Optivol thoracic impedance suggesting possible fluid accumulation starting 8/11 with only 1 day at baseline (8/25).     Prescribed: No diuretic   Recommendations:  Left voice mail with ICM number and encouraged to call if experiencing any fluid symptoms.   Follow-up plan: ICM clinic phone appointment on 11/18/2020 to recheck fluid levels.  91 day device clinic remote transmission 10/28/2020.     EP/Cardiology Office Visits:  Recall 05/12/2021 with [name].     Copy of ICM check sent to Dr. [name].   3 month ICM trend: 11/04/2020.    1 Year ICM trend:       [name], RN 11/04/2020 3:01 PM

## 2020-11-08 ENCOUNTER — Other Ambulatory Visit: Payer: Self-pay | Admitting: Obstetrics and Gynecology

## 2020-11-08 ENCOUNTER — Other Ambulatory Visit: Payer: Self-pay

## 2020-11-08 ENCOUNTER — Ambulatory Visit
Admission: RE | Admit: 2020-11-08 | Discharge: 2020-11-08 | Disposition: A | Discharge status: Home / Self Care | Payer: BC Managed Care – PPO | Source: Ambulatory Visit | Attending: Obstetrics and Gynecology | Admitting: Obstetrics and Gynecology

## 2020-11-08 DIAGNOSIS — Z13820 Encounter for screening for osteoporosis: Secondary | ICD-10-CM

## 2020-11-08 DIAGNOSIS — Z1231 Encounter for screening mammogram for malignant neoplasm of breast: Secondary | ICD-10-CM

## 2020-11-08 DIAGNOSIS — Z780 Asymptomatic menopausal state: Secondary | ICD-10-CM | POA: Diagnosis not present

## 2020-11-08 DIAGNOSIS — M85851 Other specified disorders of bone density and structure, right thigh: Secondary | ICD-10-CM | POA: Diagnosis not present

## 2020-11-08 DIAGNOSIS — T148XXA Other injury of unspecified body region, initial encounter: Secondary | ICD-10-CM

## 2020-11-13 NOTE — Progress Notes (Signed)
Remote ICD transmission.   

## 2020-11-15 ENCOUNTER — Ambulatory Visit: Payer: BC Managed Care – PPO | Admitting: Podiatry

## 2020-11-15 ENCOUNTER — Other Ambulatory Visit: Payer: Self-pay

## 2020-11-15 DIAGNOSIS — M7661 Achilles tendinitis, right leg: Secondary | ICD-10-CM | POA: Diagnosis not present

## 2020-11-18 ENCOUNTER — Ambulatory Visit (INDEPENDENT_AMBULATORY_CARE_PROVIDER_SITE_OTHER): Payer: BC Managed Care – PPO

## 2020-11-18 DIAGNOSIS — I5022 Chronic systolic (congestive) heart failure: Secondary | ICD-10-CM

## 2020-11-18 DIAGNOSIS — Z95810 Presence of automatic (implantable) cardiac defibrillator: Secondary | ICD-10-CM

## 2020-11-20 ENCOUNTER — Encounter: Payer: Self-pay | Admitting: Podiatry

## 2020-11-20 NOTE — Progress Notes (Signed)
Subjective:  Patient ID: Joanne Bell, female    DOB: 12-Oct-1960,  MRN: [id number]  Chief Complaint  Patient presents with   Foot Pain    Heel pain  PT stated that she still has some discomfort     60 y.o. female presents with the above complaint.  Patient presents for follow-up of Achilles tendinitis right much greater than left side.  She states the cam boot helped a little bit.  She still has some residual pain.  She would like to discuss further treatment options for this.  She denies any other acute complaints.  There are still some stiffness associated with it.   Review of Systems: Negative except as noted in the HPI. Denies N/V/F/Ch.  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 0000000   Chronic systolic CHF (congestive heart failure), NYHA class 3 (HCC)    Colon polyps    Fibroids, intramural    Hypertension    NICM (nonischemic cardiomyopathy) ([place]) 07/20/2015    Current Outpatient Medications:    carvedilol (COREG) 25 MG tablet, Take 1 tablet (25 mg total) by mouth 2 (two) times daily., Disp: 180 tablet, Rfl: 3   sacubitril-valsartan (ENTRESTO) 97-103 MG, Take 0.5 tablets by mouth 2 (two) times daily., Disp: 90 tablet, Rfl: 3   vitamin E 100 UNIT capsule, Take 100 Units by mouth daily., Disp: , Rfl:   Social History   Tobacco Use  Smoking Status Never  Smokeless Tobacco Never    Allergies  Allergen Reactions   Prednisone     Insomnia, headache and sweats   Acyclovir And Related Other (See Comments)    Reaction unknown to patient   Augmentin [Amoxicillin-Pot Clavulanate] Nausea And Vomiting   Clonidine Derivatives Other (See Comments)    Pt states "it makes me irritable"   Percocet [Oxycodone-Acetaminophen] Other (See Comments)    Dizziness, sweating, abdominal discomfort   Objective:  There were no  vitals filed for this visit. There is no height or weight on file to calculate BMI. Constitutional Well developed. Well nourished.  Vascular Dorsalis pedis pulses palpable bilaterally. Posterior tibial pulses palpable bilaterally. Capillary refill normal to all digits.  No cyanosis or clubbing noted. Pedal hair growth normal.  Neurologic Normal speech. Oriented to person, place, and time. Epicritic sensation to light touch grossly present bilaterally.  Dermatologic Nails well groomed and normal in appearance. No open wounds. No skin lesions.  Orthopedic: Pain on palpation right Achilles tendinitis positive Silfverskiold test noted gastrocnemius equinus.  Pain at the insertion of the Achilles tendon pain with dorsiflexion of the ankle joint bilaterally.  Haglund's deformity clinically palpated.   Radiographs: 3 views of skeletally mature adult bilateral foot: No posterior plantar heel spurring noted.  Pes planovalgus foot structure noted. Assessment:   1. Achilles tendinitis, right leg     Plan:  Patient was evaluated and treated and all questions answered.  Bilateral Achilles tendinitis with mild Haglund's deformity and underlying gastrocnemius equinus right greater than left -I explained the patient the etiology of Achilles tendinitis and various treatment options were extensively discussed.  Clinically her pain has improved with cam boot immobilization she still has residual pain.  At this time I discussed with her she will benefit from steroid injection help decrease acute inflammatory component associated pain.  Patient states understand like to proceed with steroid injection.  I discussed with her  that this is near the Achilles tendon and there is a risk of rupture associated with it.  She would like to proceed despite the risks. -A steroid injection was performed at right Kager's fat pad using 1% plain Lidocaine and 10 mg of Kenalog. This was well tolerated. -Tri-Lock ankle brace  was dispensed to help with the transition out of the boot.  No follow-ups on file.

## 2020-11-20 NOTE — Progress Notes (Signed)
EPIC Encounter for ICM Monitoring  Patient Name: Joanne Bell is a 60 y.o. female Date: 11/20/2020 Primary Care Physican: [name], MD Primary Cardiologist: [name] Electrophysiologist: Vergie Living Pacing:  99.3%    09/18/2020 office weight: 153 lbs                                                          Transmission reviewed.    Optivol thoracic impedance suggesting fluid levels returned to normal.     Prescribed: No diuretic   Recommendations:  No changes   Follow-up plan: ICM clinic phone appointment on 12/16/2020.  91 day device clinic remote transmission 01/27/2021.     EP/Cardiology Office Visits:  Recall 05/12/2021 with [name].     Copy of ICM check sent to Dr. [name].    3 month ICM trend: 11/18/2020.    1 Year ICM trend:       [name], RN 11/20/2020 2:40 PM

## 2020-12-12 ENCOUNTER — Other Ambulatory Visit: Payer: Self-pay

## 2020-12-12 ENCOUNTER — Ambulatory Visit
Admission: RE | Admit: 2020-12-12 | Discharge: 2020-12-12 | Disposition: A | Discharge status: Home / Self Care | Payer: BC Managed Care – PPO | Source: Ambulatory Visit | Attending: Obstetrics and Gynecology | Admitting: Obstetrics and Gynecology

## 2020-12-12 DIAGNOSIS — Z1231 Encounter for screening mammogram for malignant neoplasm of breast: Secondary | ICD-10-CM | POA: Diagnosis not present

## 2020-12-16 ENCOUNTER — Ambulatory Visit (INDEPENDENT_AMBULATORY_CARE_PROVIDER_SITE_OTHER): Payer: BC Managed Care – PPO

## 2020-12-16 DIAGNOSIS — I5022 Chronic systolic (congestive) heart failure: Secondary | ICD-10-CM | POA: Diagnosis not present

## 2020-12-16 DIAGNOSIS — Z95810 Presence of automatic (implantable) cardiac defibrillator: Secondary | ICD-10-CM | POA: Diagnosis not present

## 2020-12-18 ENCOUNTER — Other Ambulatory Visit: Payer: Self-pay

## 2020-12-18 ENCOUNTER — Ambulatory Visit: Payer: BC Managed Care – PPO | Admitting: Podiatry

## 2020-12-18 DIAGNOSIS — M7661 Achilles tendinitis, right leg: Secondary | ICD-10-CM | POA: Diagnosis not present

## 2020-12-20 NOTE — Progress Notes (Signed)
EPIC Encounter for ICM Monitoring  Patient Name: Joanne Bell is a 60 y.o. female Date: 12/20/2020 Primary Care Physican: [name], MD Primary Cardiologist: [name] Electrophysiologist: Vergie Living Pacing:  99.6%    09/18/2020 office weight: 153 lbs                                                          Transmission reviewed.    Optivol thoracic impedance suggesting normal fluid levels.     Prescribed: No diuretic   Recommendations:  No changes   Follow-up plan: ICM clinic phone appointment on 01/20/2021.  91 day device clinic remote transmission 01/27/2021.     EP/Cardiology Office Visits:  Recall 05/12/2021 with [name].     Copy of ICM check sent to Dr. [name].   3 month ICM trend: 12/16/2020.    1 Year ICM trend:       [name], RN 12/20/2020 11:11 AM

## 2020-12-23 NOTE — Progress Notes (Signed)
  Subjective:  Patient ID: Joanne Bell, female    DOB: 06-21-1960,  MRN: [id number]  Chief Complaint  Patient presents with   Foot Pain    Pt stated that she is doing better     60 y.o. female presents with the above complaint.  Patient presents for follow-up of right Achilles tendinitis.  She states she is doing a lot better.  The injection helped considerably the bracing is helping.  She has no acute concerns.  She is about 90 to 95% better.  She denies any other issues.   Review of Systems: Negative except as noted in the HPI. Denies N/V/F/Ch.  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   Chronic systolic CHF (congestive heart failure), NYHA class 3 (HCC)    Colon polyps    Fibroids, intramural    Hypertension    NICM (nonischemic cardiomyopathy) ([place]) 07/20/2015    Current Outpatient Medications:    carvedilol (COREG) 25 MG tablet, Take 1 tablet (25 mg total) by mouth 2 (two) times daily., Disp: 180 tablet, Rfl: 3   sacubitril-valsartan (ENTRESTO) 97-103 MG, Take 0.5 tablets by mouth 2 (two) times daily., Disp: 90 tablet, Rfl: 3   vitamin E 100 UNIT capsule, Take 100 Units by mouth daily., Disp: , Rfl:   Social History   Tobacco Use  Smoking Status Never  Smokeless Tobacco Never    Allergies  Allergen Reactions   Prednisone     Insomnia, headache and sweats   Acyclovir And Related Other (See Comments)    Reaction unknown to patient   Augmentin [Amoxicillin-Pot Clavulanate] Nausea And Vomiting   Clonidine Derivatives Other (See Comments)    Pt states "it makes me irritable"   Percocet [Oxycodone-Acetaminophen] Other (See Comments)    Dizziness, sweating, abdominal discomfort   Objective:  There were no vitals filed for this visit. There is no height or weight on file to calculate  BMI. Constitutional Well developed. Well nourished.  Vascular Dorsalis pedis pulses palpable bilaterally. Posterior tibial pulses palpable bilaterally. Capillary refill normal to all digits.  No cyanosis or clubbing noted. Pedal hair growth normal.  Neurologic Normal speech. Oriented to person, place, and time. Epicritic sensation to light touch grossly present bilaterally.  Dermatologic Nails well groomed and normal in appearance. No open wounds. No skin lesions.  Orthopedic: Now pain on palpation right Achilles tendinitis positive Silfverskiold test noted gastrocnemius equinus.  Now pain at the insertion of the Achilles tendon pain with dorsiflexion of the ankle joint bilaterally.  Haglund's deformity clinically palpated.   Radiographs: 3 views of skeletally mature adult bilateral foot: No posterior plantar heel spurring noted.  Pes planovalgus foot structure noted. Assessment:   1. Achilles tendinitis, right leg      Plan:  Patient was evaluated and treated and all questions answered.  Bilateral Achilles tendinitis with mild Haglund's deformity and underlying gastrocnemius equinus right greater than left -Clinically resolved with a steroid injection Tri-Lock ankle brace.  I discussed shoe gear modification and heel lift.  If any foot and ankle issues arise in future I asked her to come see me.  She is officially discharged from my care.  She states understanding.  No follow-ups on file.

## 2021-01-20 ENCOUNTER — Ambulatory Visit (INDEPENDENT_AMBULATORY_CARE_PROVIDER_SITE_OTHER): Payer: BC Managed Care – PPO

## 2021-01-20 DIAGNOSIS — Z95810 Presence of automatic (implantable) cardiac defibrillator: Secondary | ICD-10-CM

## 2021-01-20 DIAGNOSIS — I5022 Chronic systolic (congestive) heart failure: Secondary | ICD-10-CM | POA: Diagnosis not present

## 2021-01-24 NOTE — Progress Notes (Signed)
EPIC Encounter for ICM Monitoring  Patient Name: Joanne Bell is a 60 y.o. female Date: 01/24/2021 Primary Care Physican: [name], MD Primary Cardiologist: [name] Electrophysiologist: Vergie Living Pacing:  99.5%    09/18/2020 office weight: 153 lbs                                                          Transmission reviewed.    Optivol thoracic impedance suggesting normal fluid levels.     Prescribed: No diuretic   Recommendations:  No changes   Follow-up plan: ICM clinic phone appointment on 01/20/2021.  91 day device clinic remote transmission 01/27/2021.     EP/Cardiology Office Visits:  Recall 05/12/2021 with [name].     Copy of ICM check sent to Dr. [name].    3 month ICM trend: 01/20/2021.    12-14 Month ICM trend:       [name], RN 01/24/2021 1:48 PM

## 2021-01-27 ENCOUNTER — Ambulatory Visit (INDEPENDENT_AMBULATORY_CARE_PROVIDER_SITE_OTHER): Payer: BC Managed Care – PPO

## 2021-01-27 DIAGNOSIS — I428 Other cardiomyopathies: Secondary | ICD-10-CM

## 2021-01-28 LAB — CUP PACEART REMOTE DEVICE CHECK
Battery Remaining Longevity: 21 mo
Battery Voltage: 2.92 V
Brady Statistic AP VP Percent: 5.71 %
Brady Statistic AP VS Percent: 0.01 %
Brady Statistic AS VP Percent: 94.28 %
Brady Statistic AS VS Percent: 0 %
Brady Statistic RA Percent Paced: 5.7 %
Brady Statistic RV Percent Paced: 99.4 %
Date Time Interrogation Session: 20221121001702
HighPow Impedance: 59 Ohm
Implantable Lead Implant Date: 20121210
Implantable Lead Implant Date: 20170512
Implantable Lead Implant Date: 20170512
Implantable Lead Location: 753858
Implantable Lead Location: 753859
Implantable Lead Location: 753860
Implantable Lead Model: 4598
Implantable Pulse Generator Implant Date: 20170512
Lead Channel Impedance Value: 342 Ohm
Lead Channel Impedance Value: 361 Ohm
Lead Channel Impedance Value: 361 Ohm
Lead Channel Impedance Value: 399 Ohm
Lead Channel Impedance Value: 418 Ohm
Lead Channel Impedance Value: 456 Ohm
Lead Channel Impedance Value: 513 Ohm
Lead Channel Impedance Value: 513 Ohm
Lead Channel Impedance Value: 589 Ohm
Lead Channel Impedance Value: 589 Ohm
Lead Channel Impedance Value: 722 Ohm
Lead Channel Impedance Value: 779 Ohm
Lead Channel Impedance Value: 779 Ohm
Lead Channel Pacing Threshold Amplitude: 0.5 V
Lead Channel Pacing Threshold Amplitude: 0.625 V
Lead Channel Pacing Threshold Amplitude: 1.625 V
Lead Channel Pacing Threshold Pulse Width: 0.4 ms
Lead Channel Pacing Threshold Pulse Width: 0.4 ms
Lead Channel Pacing Threshold Pulse Width: 0.8 ms
Lead Channel Sensing Intrinsic Amplitude: 11.25 mV
Lead Channel Sensing Intrinsic Amplitude: 11.25 mV
Lead Channel Sensing Intrinsic Amplitude: 2.875 mV
Lead Channel Sensing Intrinsic Amplitude: 2.875 mV
Lead Channel Setting Pacing Amplitude: 1.5 V
Lead Channel Setting Pacing Amplitude: 2 V
Lead Channel Setting Pacing Amplitude: 2.75 V
Lead Channel Setting Pacing Pulse Width: 0.4 ms
Lead Channel Setting Pacing Pulse Width: 0.8 ms
Lead Channel Setting Sensing Sensitivity: 0.3 mV

## 2021-02-05 NOTE — Progress Notes (Signed)
Remote ICD transmission.   

## 2021-02-10 ENCOUNTER — Ambulatory Visit: Payer: BC Managed Care – PPO | Admitting: Family Medicine

## 2021-02-10 ENCOUNTER — Encounter: Payer: Self-pay | Admitting: Family Medicine

## 2021-02-10 VITALS — BP 120/80 | HR 53 | Temp 97.8°F | Wt 155.0 lb

## 2021-02-10 DIAGNOSIS — K219 Gastro-esophageal reflux disease without esophagitis: Secondary | ICD-10-CM

## 2021-02-10 DIAGNOSIS — Z23 Encounter for immunization: Secondary | ICD-10-CM | POA: Diagnosis not present

## 2021-02-10 MED ORDER — OMEPRAZOLE 40 MG PO CPDR: 40.0000 mg | DELAYED_RELEASE_CAPSULE | Freq: Every day | ORAL | 11 refills | Status: DC

## 2021-02-10 NOTE — Progress Notes (Signed)
   Subjective:    Patient ID: Joanne Bell, female    DOB: [birth year], 60 y.o.   MRN: [id number]  HPI Here for another flare of GERD. She took Prilosec a few years ago, but she stopped when things improved. Now she again has night time reflux of hot fluid in to the back of her throat and this makes her cough. She has occasional daytime heartburn. No toruble swallowing.    Review of Systems  Constitutional: Negative.   Respiratory: Negative.    Cardiovascular: Negative.   Gastrointestinal:  Negative for abdominal distention, abdominal pain, constipation, diarrhea and nausea.      Objective:   Physical Exam Constitutional:      Appearance: Normal appearance.  Cardiovascular:     Rate and Rhythm: Normal rate and regular rhythm.     Pulses: Normal pulses.     Heart sounds: Normal heart sounds.  Pulmonary:     Effort: Pulmonary effort is normal.     Breath sounds: Normal breath sounds.  Neurological:     Mental Status: She is alert.          Assessment & Plan:  GERD, she will start back on Omeprazole 40 mg daily.  Alysia Penna, MD

## 2021-02-24 ENCOUNTER — Ambulatory Visit (INDEPENDENT_AMBULATORY_CARE_PROVIDER_SITE_OTHER): Payer: BC Managed Care – PPO

## 2021-02-24 DIAGNOSIS — I5022 Chronic systolic (congestive) heart failure: Secondary | ICD-10-CM | POA: Diagnosis not present

## 2021-02-24 DIAGNOSIS — Z95810 Presence of automatic (implantable) cardiac defibrillator: Secondary | ICD-10-CM | POA: Diagnosis not present

## 2021-02-26 DIAGNOSIS — Z20822 Contact with and (suspected) exposure to covid-19: Secondary | ICD-10-CM | POA: Diagnosis not present

## 2021-02-26 DIAGNOSIS — Z03818 Encounter for observation for suspected exposure to other biological agents ruled out: Secondary | ICD-10-CM | POA: Diagnosis not present

## 2021-02-26 NOTE — Progress Notes (Signed)
EPIC Encounter for ICM Monitoring  Patient Name: Joanne Bell is a 60 y.o. female Date: 02/26/2021 Primary Care Physican: [name], MD Primary Cardiologist: [name] Electrophysiologist: Vergie Living Pacing:  99.3%    02/10/2021 office weight: 155 lbs                                                          Transmission reviewed.    Optivol thoracic impedance suggesting normal fluid levels.     Prescribed: No diuretic   Recommendations:  No changes   Follow-up plan: ICM clinic phone appointment on 03/31/2021.  91 day device clinic remote transmission 04/28/2021.     EP/Cardiology Office Visits:  Recall 05/12/2021 with [name].     Copy of ICM check sent to Dr. [name].    3 month ICM trend: 02/24/2021.    12-14 Month ICM trend:       [name], RN 02/26/2021 12:41 PM

## 2021-03-31 ENCOUNTER — Ambulatory Visit (INDEPENDENT_AMBULATORY_CARE_PROVIDER_SITE_OTHER): Payer: BC Managed Care – PPO

## 2021-03-31 DIAGNOSIS — Z95810 Presence of automatic (implantable) cardiac defibrillator: Secondary | ICD-10-CM

## 2021-03-31 DIAGNOSIS — I5022 Chronic systolic (congestive) heart failure: Secondary | ICD-10-CM

## 2021-04-02 NOTE — Progress Notes (Signed)
EPIC Encounter for ICM Monitoring  Patient Name: Joanne Bell is a 61 y.o. female Date: 04/02/2021 Primary Care Physican: [name], MD Primary Cardiologist: [name] Electrophysiologist: Vergie Living Pacing:  98.7%    02/10/2021 office weight: 155 lbs                                                          Transmission reviewed.    Optivol thoracic impedance suggesting normal fluid levels.     Prescribed: No diuretic   Recommendations:  No changes   Follow-up plan: ICM clinic phone appointment on 03/31/2021.  91 day device clinic remote transmission 04/28/2021.     EP/Cardiology Office Visits:  Recall 05/12/2021 with [name].     Copy of ICM check sent to Dr. [name].    3 month ICM trend: 04/01/2021.    12-14 Month ICM trend:     Joanne Billings, RN 04/02/2021 2:10 PM

## 2021-04-11 ENCOUNTER — Ambulatory Visit (INDEPENDENT_AMBULATORY_CARE_PROVIDER_SITE_OTHER): Payer: 59 | Admitting: Family Medicine

## 2021-04-11 ENCOUNTER — Encounter: Payer: Self-pay | Admitting: Family Medicine

## 2021-04-11 VITALS — BP 126/82 | HR 54 | Temp 98.6°F | Ht 63.25 in | Wt 156.0 lb

## 2021-04-11 DIAGNOSIS — Z0000 Encounter for general adult medical examination without abnormal findings: Secondary | ICD-10-CM

## 2021-04-11 LAB — TSH: TSH: 2.05 u[IU]/mL (ref 0.35–5.50)

## 2021-04-11 LAB — BASIC METABOLIC PANEL
BUN: 11 mg/dL (ref 6–23)
CO2: 33 mEq/L — ABNORMAL HIGH (ref 19–32)
Calcium: 9.6 mg/dL (ref 8.4–10.5)
Chloride: 102 mEq/L (ref 96–112)
Creatinine, Ser: 1 mg/dL (ref 0.40–1.20)
GFR: 60.28 mL/min (ref >60.00)
Glucose, Bld: 102 mg/dL — ABNORMAL HIGH (ref 70–99)
Potassium: 3.9 mEq/L (ref 3.5–5.1)
Sodium: 140 mEq/L (ref 135–145)

## 2021-04-11 LAB — CBC WITH DIFFERENTIAL/PLATELET
Basophils Absolute: 0 10*3/uL (ref 0.0–0.1)
Basophils Relative: 0.4 % (ref 0.0–3.0)
Eosinophils Absolute: 0 10*3/uL (ref 0.0–0.7)
Eosinophils Relative: 1.1 % (ref 0.0–5.0)
HCT: 37.2 % (ref 36.0–46.0)
Hemoglobin: 11.8 g/dL — ABNORMAL LOW (ref 12.0–15.0)
Lymphocytes Relative: 51.7 % — ABNORMAL HIGH (ref 12.0–46.0)
Lymphs Abs: 2.3 10*3/uL (ref 0.7–4.0)
MCHC: 31.8 g/dL (ref 30.0–36.0)
MCV: 83.8 fl (ref 78.0–100.0)
Monocytes Absolute: 0.3 10*3/uL (ref 0.1–1.0)
Monocytes Relative: 7.8 % (ref 3.0–12.0)
Neutro Abs: 1.7 10*3/uL (ref 1.4–7.7)
Neutrophils Relative %: 39 % — ABNORMAL LOW (ref 43.0–77.0)
Platelets: 229 10*3/uL (ref 150.0–400.0)
RBC: 4.44 Mil/uL (ref 3.87–5.11)
RDW: 13.7 % (ref 11.5–15.5)
WBC: 4.4 10*3/uL (ref 4.0–10.5)

## 2021-04-11 LAB — HEPATIC FUNCTION PANEL
ALT: 11 U/L (ref 0–35)
AST: 17 U/L (ref 0–37)
Albumin: 4.3 g/dL (ref 3.5–5.2)
Alkaline Phosphatase: 57 U/L (ref 39–117)
Bilirubin, Direct: 0 mg/dL (ref 0.0–0.3)
Total Bilirubin: 0.3 mg/dL (ref 0.2–1.2)
Total Protein: 7.1 g/dL (ref 6.0–8.3)

## 2021-04-11 LAB — LIPID PANEL
Cholesterol: 210 mg/dL — ABNORMAL HIGH (ref 0–200)
HDL: 52.4 mg/dL (ref >39.00)
LDL Cholesterol: 129 mg/dL — ABNORMAL HIGH (ref 0–99)
NonHDL: 157.57
Total CHOL/HDL Ratio: 4
Triglycerides: 141 mg/dL (ref 0.0–149.0)
VLDL: 28.2 mg/dL (ref 0.0–40.0)

## 2021-04-11 LAB — HEMOGLOBIN A1C: Hgb A1c MFr Bld: 6.5 % (ref 4.6–6.5)

## 2021-04-11 MED ORDER — OMEPRAZOLE 20 MG PO CPDR: 20.0000 mg | DELAYED_RELEASE_CAPSULE | ORAL | 3 refills | Status: DC | PRN

## 2021-04-11 NOTE — Progress Notes (Signed)
° °  Subjective:    Patient ID: Joanne Bell, female    DOB: September 04, 1960, 61 y.o.   MRN: [id number]  HPI Here for a well exam. She feels fine. She sees Cardiology yearly.    Review of Systems  Constitutional: Negative.   HENT: Negative.    Eyes: Negative.   Respiratory: Negative.    Cardiovascular: Negative.   Gastrointestinal: Negative.   Genitourinary:  Negative for decreased urine volume, difficulty urinating, dyspareunia, dysuria, enuresis, flank pain, frequency, hematuria, pelvic pain and urgency.  Musculoskeletal: Negative.   Skin: Negative.   Neurological: Negative.  Negative for headaches.  Psychiatric/Behavioral: Negative.        Objective:   Physical Exam Constitutional:      General: She is not in acute distress.    Appearance: Normal appearance. She is well-developed.  HENT:     Head: Normocephalic and atraumatic.     Right Ear: External ear normal.     Left Ear: External ear normal.     Nose: Nose normal.     Mouth/Throat:     Pharynx: No oropharyngeal exudate.  Eyes:     General: No scleral icterus.    Conjunctiva/sclera: Conjunctivae normal.     Pupils: Pupils are equal, round, and reactive to light.  Neck:     Thyroid: No thyromegaly.     Vascular: No JVD.  Cardiovascular:     Rate and Rhythm: Normal rate and regular rhythm.     Heart sounds: Normal heart sounds. No murmur heard.   No friction rub. No gallop.  Pulmonary:     Effort: Pulmonary effort is normal. No respiratory distress.     Breath sounds: Normal breath sounds. No wheezing or rales.  Chest:     Chest wall: No tenderness.  Abdominal:     General: Bowel sounds are normal. There is no distension.     Palpations: Abdomen is soft. There is no mass.     Tenderness: There is no abdominal tenderness. There is no guarding or rebound.  Musculoskeletal:        General: No tenderness. Normal range of motion.     Cervical back: Normal range of motion and neck supple.  Lymphadenopathy:      Cervical: No cervical adenopathy.  Skin:    General: Skin is warm and dry.     Findings: No erythema or rash.  Neurological:     Mental Status: She is alert and oriented to person, place, and time.     Cranial Nerves: No cranial nerve deficit.     Motor: No abnormal muscle tone.     Coordination: Coordination normal.     Deep Tendon Reflexes: Reflexes are normal and symmetric. Reflexes normal.  Psychiatric:        Behavior: Behavior normal.        Thought Content: Thought content normal.        Judgment: Judgment normal.          Assessment & Plan:  Well exam. We discussed diet and exercise. Get fasting labs.  Alysia Penna, MD

## 2021-04-28 ENCOUNTER — Ambulatory Visit (INDEPENDENT_AMBULATORY_CARE_PROVIDER_SITE_OTHER): Payer: BC Managed Care – PPO

## 2021-04-28 DIAGNOSIS — I428 Other cardiomyopathies: Secondary | ICD-10-CM

## 2021-04-29 LAB — CUP PACEART REMOTE DEVICE CHECK
Battery Remaining Longevity: 20 mo
Battery Voltage: 2.91 V
Brady Statistic AP VP Percent: 14.37 %
Brady Statistic AP VS Percent: 0.01 %
Brady Statistic AS VP Percent: 85.61 %
Brady Statistic AS VS Percent: 0.02 %
Brady Statistic RA Percent Paced: 14.32 %
Brady Statistic RV Percent Paced: 99.04 %
Date Time Interrogation Session: 20230220012304
HighPow Impedance: 55 Ohm
Implantable Lead Implant Date: 20121210
Implantable Lead Implant Date: 20170512
Implantable Lead Implant Date: 20170512
Implantable Lead Location: 753858
Implantable Lead Location: 753859
Implantable Lead Location: 753860
Implantable Lead Model: 4598
Implantable Pulse Generator Implant Date: 20170512
Lead Channel Impedance Value: 361 Ohm
Lead Channel Impedance Value: 361 Ohm
Lead Channel Impedance Value: 361 Ohm
Lead Channel Impedance Value: 418 Ohm
Lead Channel Impedance Value: 418 Ohm
Lead Channel Impedance Value: 418 Ohm
Lead Channel Impedance Value: 475 Ohm
Lead Channel Impedance Value: 513 Ohm
Lead Channel Impedance Value: 589 Ohm
Lead Channel Impedance Value: 608 Ohm
Lead Channel Impedance Value: 760 Ohm
Lead Channel Impedance Value: 760 Ohm
Lead Channel Impedance Value: 779 Ohm
Lead Channel Pacing Threshold Amplitude: 0.5 V
Lead Channel Pacing Threshold Amplitude: 0.625 V
Lead Channel Pacing Threshold Amplitude: 1.5 V
Lead Channel Pacing Threshold Pulse Width: 0.4 ms
Lead Channel Pacing Threshold Pulse Width: 0.4 ms
Lead Channel Pacing Threshold Pulse Width: 0.8 ms
Lead Channel Sensing Intrinsic Amplitude: 11.25 mV
Lead Channel Sensing Intrinsic Amplitude: 11.25 mV
Lead Channel Sensing Intrinsic Amplitude: 2.5 mV
Lead Channel Sensing Intrinsic Amplitude: 2.5 mV
Lead Channel Setting Pacing Amplitude: 1.5 V
Lead Channel Setting Pacing Amplitude: 2 V
Lead Channel Setting Pacing Amplitude: 2.5 V
Lead Channel Setting Pacing Pulse Width: 0.4 ms
Lead Channel Setting Pacing Pulse Width: 0.8 ms
Lead Channel Setting Sensing Sensitivity: 0.3 mV

## 2021-05-05 ENCOUNTER — Ambulatory Visit (INDEPENDENT_AMBULATORY_CARE_PROVIDER_SITE_OTHER): Payer: Self-pay

## 2021-05-05 DIAGNOSIS — I5022 Chronic systolic (congestive) heart failure: Secondary | ICD-10-CM

## 2021-05-05 DIAGNOSIS — Z95810 Presence of automatic (implantable) cardiac defibrillator: Secondary | ICD-10-CM

## 2021-05-05 NOTE — Progress Notes (Signed)
Remote ICD transmission.   

## 2021-05-09 NOTE — Progress Notes (Signed)
EPIC Encounter for ICM Monitoring  Patient Name: Joanne Bell is a 61 y.o. female Date: 05/09/2021 Primary Care Physican: [name], MD Primary Cardiologist: [name] Electrophysiologist: Vergie Living Pacing:  99.6%    02/10/2021 office weight: 155 lbs                                                          Transmission reviewed.    Optivol thoracic impedance suggesting normal fluid levels.     Prescribed: No diuretic   Recommendations:  No changes   Follow-up plan: ICM clinic phone appointment on 06/09/2021.  91 day device clinic remote transmission 07/28/2021.     EP/Cardiology Office Visits:  06/25/2021 with [name].     Copy of ICM check sent to Dr. [name].    3 month ICM trend: 05/05/2021.    12-14 Month ICM trend:     [name], RN 05/09/2021 4:53 PM

## 2021-05-26 ENCOUNTER — Telehealth: Payer: Self-pay | Admitting: Family Medicine

## 2021-05-26 NOTE — Telephone Encounter (Signed)
Handicap Parking Placard form to be filled out--placed in dr's folder.  Call [phone] when complete.  ?

## 2021-05-30 ENCOUNTER — Telehealth: Payer: Self-pay

## 2021-05-30 NOTE — Telephone Encounter (Signed)
Left detailed message advising to pick up Placard form from our office ?

## 2021-05-30 NOTE — Telephone Encounter (Signed)
Pt placard is complete, left a detailed message for pt to pick up form at the office ?

## 2021-06-09 ENCOUNTER — Ambulatory Visit (INDEPENDENT_AMBULATORY_CARE_PROVIDER_SITE_OTHER): Payer: 59

## 2021-06-09 DIAGNOSIS — I5022 Chronic systolic (congestive) heart failure: Secondary | ICD-10-CM | POA: Diagnosis not present

## 2021-06-09 DIAGNOSIS — Z95810 Presence of automatic (implantable) cardiac defibrillator: Secondary | ICD-10-CM

## 2021-06-11 NOTE — Progress Notes (Signed)
EPIC Encounter for ICM Monitoring ? ?Patient Name: Joanne Bell is a 61 y.o. female ?Date: 06/11/2021 ?Primary Care Physican: [name], MD ?Primary Cardiologist: [name] ?Electrophysiologist: [name] ?Bi-V Pacing:  99.7%    ?02/10/2021 office weight: 155 lbs                                                        ?  ?Transmission reviewed.  ?  ?Optivol thoracic impedance suggesting normal fluid levels.   ?  ?Prescribed: No diuretic ?  ?Recommendations:  No changes ?  ?Follow-up plan: ICM clinic phone appointment on 06/09/2021.  91 day device clinic remote transmission 07/28/2021.   ?  ?EP/Cardiology Office Visits:  06/25/2021 with [name].   ?  ?Copy of ICM check sent to Dr. [name].  ? ?3 month ICM trend: 06/09/2021. ? ? ? ?12-14 Month ICM trend:  ? ? ? ?[name], RN ?06/11/2021 ?5:07 PM ? ?

## 2021-06-17 ENCOUNTER — Encounter: Payer: 59 | Admitting: Internal Medicine

## 2021-06-20 ENCOUNTER — Telehealth: Payer: Self-pay | Admitting: Family Medicine

## 2021-06-20 ENCOUNTER — Telehealth (INDEPENDENT_AMBULATORY_CARE_PROVIDER_SITE_OTHER): Payer: 59 | Admitting: Family Medicine

## 2021-06-20 DIAGNOSIS — R0981 Nasal congestion: Secondary | ICD-10-CM | POA: Diagnosis not present

## 2021-06-20 DIAGNOSIS — R519 Headache, unspecified: Secondary | ICD-10-CM | POA: Diagnosis not present

## 2021-06-20 MED ORDER — DOXYCYCLINE HYCLATE 100 MG PO TABS: 100.0000 mg | ORAL_TABLET | Freq: Two times a day (BID) | ORAL | 0 refills | Status: DC

## 2021-06-20 MED ORDER — BENZONATATE 100 MG PO CAPS: 100.0000 mg | ORAL_CAPSULE | Freq: Three times a day (TID) | ORAL | 0 refills | Status: DC | PRN

## 2021-06-20 NOTE — Telephone Encounter (Signed)
[facility] called in for congestion, since Monday (2 days) patient states that she has taken Mucinex, and cough suppressants and it hasn't gotten better patient also states that she took [facility] home test and it was negative. ? ?She states she really just want something to help her fill better. I scheduled her for virtual with Dr Maudie Mercury.  ?

## 2021-06-20 NOTE — Patient Instructions (Addendum)
-  I sent the medication(s) we discussed to your pharmacy: ?Meds ordered this encounter  ?Medications  ? benzonatate (TESSALON PERLES) 100 MG capsule  ?  Sig: Take 1 capsule (100 mg total) by mouth 3 (three) times daily as needed.  ?  Dispense:  20 capsule  ?  Refill:  0  ? doxycycline (VIBRA-TABS) 100 MG tablet  ?  Sig: Take 1 tablet (100 mg total) by mouth 2 (two) times daily.  ?  Dispense:  14 tablet  ?  Refill:  0  ? ?Retest for covid. If positive can do follow up video visit or contact a [facility] pharmacy for treatment. ? ?Nasal saline rinses twice daily.  ? ?I hope you are feeling better soon! ? ?Seek in person care promptly if your symptoms worsen, new concerns arise or you are not improving with treatment. ? ?It was nice to meet you today. I help [facility] out with telemedicine visits on Tuesdays and Thursdays and am happy to help if you need a virtual follow up visit on those days. Otherwise, if you have any concerns or questions following this visit please schedule a follow up visit with your Primary Care office or seek care at a local urgent care clinic to avoid delays in care ? ?

## 2021-06-20 NOTE — Telephone Encounter (Signed)
FYI

## 2021-06-20 NOTE — Progress Notes (Signed)
Virtual Visit via Video Note ? ?I connected with Teniyah ? on 06/20/21 at  1:00 PM EDT by a video enabled telemedicine application and verified that I am speaking with the correct person using two identifiers. ? Location patient: [place] ?Location provider:work or home office ?Persons participating in the virtual visit: patient, provider ? ?I discussed the limitations and requested verbal permission for telemedicine visit. The patient expressed understanding and agreed to proceed. ? ? ?HPI: ? ?Acute telemedicine visit for "sinus infection": ?-Onset: has had some issues for several weeks, but worse this week ?-Symptoms include: facial discomfort - maxillary, nasal congestion, cough, pnd, poor appetite, vomiting 2 days ago ?-reports has had this in the past and PCP gave her abx which cleared it up ?-did a covid test 2 days ago negative ?-Denies: fever, CP, SOB, NVD, body aches ?-grandson has been sick ?-Pertinent past medical history: see below ?-Pertinent medication allergies: ?Allergies  ?Allergen Reactions  ? Prednisone   ?  Insomnia, headache and sweats  ? Acyclovir And Related Other (See Comments)  ?  Reaction unknown to patient  ? Augmentin [Amoxicillin-Pot Clavulanate] Nausea And Vomiting  ? Clonidine Derivatives Other (See Comments)  ?  Pt states "it makes me irritable"  ? Percocet [Oxycodone-Acetaminophen] Other (See Comments)  ?  Dizziness, sweating, abdominal discomfort  ?-COVID-19 vaccine status:  ?Immunization History  ?Administered Date(s) Administered  ? Influenza,inj,Quad PF,6+ Mos 11/30/2014, 12/22/2018, 02/10/2021  ? ? ? ?ROS: See pertinent positives and negatives per HPI. ? ?Past Medical History:  ?Diagnosis Date  ? Atrioventricular block, complete (HCC)   ? narrow QRS  ? Bilateral ovarian cysts   ? 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 Chronic systolic CHF (congestive heart failure), NYHA class 3 ([place])   ? Colon polyps   ? Fibroids, intramural   ?  Hypertension   ? NICM (nonischemic cardiomyopathy) ([place]) 07/20/2015  ? ? ?Past Surgical History:  ?Procedure Laterality Date  ? COLONOSCOPY  05/24/2019  ? per Dr. Watt Climes, adenomatous polyps, repeat in 5 yrs  ? ENDOMETRIAL ABLATION  03/09/2002  ? EP IMPLANTABLE DEVICE N/A 07/19/2015  ? Procedure: Upgrade to [place] ICD ;  Surgeon: Deboraha Sprang, MD;  Location: [facility];  Service: Cardiovascular;  Laterality: N/A;  ? MYOMECTOMY  03/09/1996  ? per Dr. Garwin Brothers  ? PERMANENT PACEMAKER INSERTION Left 02/16/2011  ? Procedure: PERMANENT PACEMAKER INSERTION;  Surgeon: Deboraha Sprang, MD;  Location: [facility] C[facility];  Service: Cardiovascular;  Laterality: Left;  ? ? ? ?Current Outpatient Medications:  ?  benzonatate (TESSALON PERLES) 100 MG capsule, Take 1 capsule (100 mg total) by mouth 3 (three) times daily as needed., Disp: 20 capsule, Rfl: 0 ?  carvedilol (COREG) 25 MG tablet, Take 1 tablet (25 mg total) by mouth 2 (two) times daily., Disp: 180 tablet, Rfl: 3 ?  doxycycline (VIBRA-TABS) 100 MG tablet, Take 1 tablet (100 mg total) by mouth 2 (two) times daily., Disp: 14 tablet, Rfl: 0 ?  omeprazole (PRILOSEC) 20 MG capsule, Take 1 capsule (20 mg total) by mouth as needed (GERD)., Disp: 30 capsule, Rfl: 3 ?  sacubitril-valsartan (ENTRESTO) 97-103 MG, Take 0.5 tablets by mouth 2 (two) times daily., Disp: 90 tablet, Rfl: 3 ?  vitamin E 100 UNIT capsule, Take 100 Units by mouth daily., Disp: , Rfl:  ? ?EXAM: ? ?VITALS per patient if applicable: ? ?GENERAL: alert, oriented, appears well and in no acute distress ? ?HEENT: atraumatic, conjunttiva clear, no obvious abnormalities on inspection of  external nose and ears ? ?NECK: normal movements of the head and neck ? ?LUNGS: on inspection no signs of respiratory distress, breathing rate appears normal, no obvious gross SOB, gasping or wheezing ? ?CV: no obvious cyanosis ? ?MS: moves all visible extremities without noticeable abnormality ? ?PSYCH/NEURO: pleasant and cooperative, no  obvious depression or anxiety, speech and thought processing grossly intact ? ?ASSESSMENT AND PLAN: ? ?Discussed the following assessment and plan: ? ?Nasal congestion ? ?Facial discomfort ? ?-we discussed possible serious and likely etiologies, options for evaluation and workup, limitations of telemedicine visit vs in person visit, treatment, treatment risks and precautions. Pt is agreeable to treatment via telemedicine at this moment. Query developing sinusitis vs new acute VURI, covid with false neg early testing vs other. She has agrees to trying nasal saline rinses, cough Rx and abx if worsening or not improving as expected. Discussed doing repeat covid testing and options for obtaining tx if positive in first 5 days.  Discussed abx risks and appropriate use.  ?Advise to seek prompt virtual visit or in person care if worsening, new symptoms arise, or if is not improving with treatment as expected per our conversation of expected course.  ?  ?I discussed the assessment and treatment plan with the patient. The patient was provided an opportunity to ask questions and all were answered. The patient agreed with the plan and demonstrated an understanding of the instructions. ?  ? ? ?Lucretia Kern, DO  ? ?

## 2021-06-25 ENCOUNTER — Encounter: Payer: Self-pay | Admitting: Internal Medicine

## 2021-06-25 ENCOUNTER — Encounter: Payer: 59 | Admitting: Internal Medicine

## 2021-06-25 ENCOUNTER — Other Ambulatory Visit: Payer: Self-pay | Admitting: *Deleted

## 2021-06-25 MED ORDER — ENTRESTO 97-103 MG PO TABS: 0.5000 | ORAL_TABLET | Freq: Two times a day (BID) | ORAL | 3 refills | Status: DC

## 2021-06-25 NOTE — Telephone Encounter (Signed)
?  Error. No encounter needed  ?

## 2021-06-30 ENCOUNTER — Other Ambulatory Visit: Payer: Self-pay

## 2021-06-30 MED ORDER — CARVEDILOL 25 MG PO TABS: 25.0000 mg | ORAL_TABLET | Freq: Two times a day (BID) | ORAL | 0 refills | Status: DC

## 2021-07-10 ENCOUNTER — Ambulatory Visit (INDEPENDENT_AMBULATORY_CARE_PROVIDER_SITE_OTHER): Payer: 59 | Admitting: Internal Medicine

## 2021-07-10 ENCOUNTER — Encounter: Payer: Self-pay | Admitting: Internal Medicine

## 2021-07-10 VITALS — BP 120/70 | HR 62 | Ht 63.25 in | Wt 152.0 lb

## 2021-07-10 DIAGNOSIS — I442 Atrioventricular block, complete: Secondary | ICD-10-CM | POA: Diagnosis not present

## 2021-07-10 DIAGNOSIS — Z95810 Presence of automatic (implantable) cardiac defibrillator: Secondary | ICD-10-CM

## 2021-07-10 DIAGNOSIS — I428 Other cardiomyopathies: Secondary | ICD-10-CM

## 2021-07-10 LAB — CUP PACEART INCLINIC DEVICE CHECK
Battery Remaining Longevity: 17 mo
Battery Voltage: 2.9 V
Brady Statistic AP VP Percent: 8.54 %
Brady Statistic AP VS Percent: 0.01 %
Brady Statistic AS VP Percent: 91.44 %
Brady Statistic AS VS Percent: 0.01 %
Brady Statistic RA Percent Paced: 8.53 %
Brady Statistic RV Percent Paced: 99.1 %
Date Time Interrogation Session: 20230504163422
HighPow Impedance: 57 Ohm
Implantable Lead Implant Date: 20121210
Implantable Lead Implant Date: 20170512
Implantable Lead Implant Date: 20170512
Implantable Lead Location: 753858
Implantable Lead Location: 753859
Implantable Lead Location: 753860
Implantable Lead Model: 4598
Implantable Pulse Generator Implant Date: 20170512
Lead Channel Impedance Value: 361 Ohm
Lead Channel Impedance Value: 361 Ohm
Lead Channel Impedance Value: 361 Ohm
Lead Channel Impedance Value: 399 Ohm
Lead Channel Impedance Value: 418 Ohm
Lead Channel Impedance Value: 456 Ohm
Lead Channel Impedance Value: 551 Ohm
Lead Channel Impedance Value: 551 Ohm
Lead Channel Impedance Value: 608 Ohm
Lead Channel Impedance Value: 646 Ohm
Lead Channel Impedance Value: 779 Ohm
Lead Channel Impedance Value: 779 Ohm
Lead Channel Impedance Value: 836 Ohm
Lead Channel Pacing Threshold Amplitude: 0.5 V
Lead Channel Pacing Threshold Amplitude: 0.625 V
Lead Channel Pacing Threshold Amplitude: 1.5 V
Lead Channel Pacing Threshold Pulse Width: 0.4 ms
Lead Channel Pacing Threshold Pulse Width: 0.4 ms
Lead Channel Pacing Threshold Pulse Width: 0.8 ms
Lead Channel Sensing Intrinsic Amplitude: 11.25 mV
Lead Channel Sensing Intrinsic Amplitude: 11.25 mV
Lead Channel Sensing Intrinsic Amplitude: 2.5 mV
Lead Channel Sensing Intrinsic Amplitude: 3.375 mV
Lead Channel Setting Pacing Amplitude: 1.5 V
Lead Channel Setting Pacing Amplitude: 2 V
Lead Channel Setting Pacing Amplitude: 2.5 V
Lead Channel Setting Pacing Pulse Width: 0.4 ms
Lead Channel Setting Pacing Pulse Width: 0.8 ms
Lead Channel Setting Sensing Sensitivity: 0.3 mV

## 2021-07-10 NOTE — Progress Notes (Signed)
? ?Electrophysiology Office Note ? ? ?Date:  07/10/2021  ? ?ID:  Joanne Bell, DOB October 24, 1960, MRN [id number] ? ?PCP:  [name], MD  ?Cardiologist:    ?Primary Electrophysiologist: sk  ? ?No chief complaint on file. ? ?  ?History of Present Illness: ?Joanne Bell is a 61 y.o. female who presents today for electrophysiology  followup for a pacemaker implanted December 2012 for complete heart block and with ongoing pacing, she developed cardiomyopathy and underwent CRT-D upgrade 2017 ? ?Her workup for underlying causes was negative including  MRI  echo Lyme titers and chest x-ray these were all normal. EF was normal by MRI 12/12  ? ?She has hx hypertension. This is much improved  ? ?The patient denies chest pain, shortness of breath, nocturnal dyspnea, orthopnea or peripheral edema.  There have been no palpitations, lightheadedness or syncope.   ? ?Date Cr K Hgb  ?5/17 0.94 3.7 11.5   ?9/19 0.84 4.3 11.0  ?10/20 0.96 4.3   ?2/23 1.00 3.9 11.8  ? ? ? ?DATE TEST EF   ?12/12 cMRI 68%   ?1/15 Echo   45 %   ?11/17 Echo   30-35 %   ?2/18 Echo  45-50%   ?9/19.  Echo  55%   ?     ? ?  ?  ? ?Past Medical History:  ?Diagnosis Date  ? Atrioventricular block, complete (HCC)   ? narrow QRS  ? Bilateral ovarian cysts   ? 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 Chronic systolic CHF (congestive heart failure), NYHA class 3 ([place])   ? Colon polyps   ? Fibroids, intramural   ? Hypertension   ? NICM (nonischemic cardiomyopathy) ([place]) 07/20/2015  ? ?Past Surgical History:  ?Procedure Laterality Date  ? COLONOSCOPY  05/24/2019  ? per Dr. Watt Climes, adenomatous polyps, repeat in 5 yrs  ? ENDOMETRIAL ABLATION  03/09/2002  ? EP IMPLANTABLE DEVICE N/A 07/19/2015  ? Procedure: Upgrade to [place] ICD ;  Surgeon: Deboraha Sprang, MD;  Location: [facility];  Service: Cardiovascular;  Laterality: N/A;  ? MYOMECTOMY  03/09/1996  ? per Dr. Garwin Brothers  ? PERMANENT PACEMAKER INSERTION Left  02/16/2011  ? Procedure: PERMANENT PACEMAKER INSERTION;  Surgeon: Deboraha Sprang, MD;  Location: [facility];  Service: Cardiovascular;  Laterality: Left;  ? ? ? ?Current Outpatient Medications  ?Medication Sig Dispense Refill  ? benzonatate (TESSALON PERLES) 100 MG capsule Take 1 capsule (100 mg total) by mouth 3 (three) times daily as needed. 20 capsule 0  ? carvedilol (COREG) 25 MG tablet Take 1 tablet (25 mg total) by mouth 2 (two) times daily. Call and schedule appointment with operator to receive further refills. Thank you. [phone] 30 tablet 0  ? doxycycline (VIBRA-TABS) 100 MG tablet Take 1 tablet (100 mg total) by mouth 2 (two) times daily. 14 tablet 0  ? omeprazole (PRILOSEC) 20 MG capsule Take 1 capsule (20 mg total) by mouth as needed (GERD). 30 capsule 3  ? sacubitril-valsartan (ENTRESTO) 97-103 MG Take 0.5 tablets by mouth 2 (two) times daily. 90 tablet 3  ? vitamin E 100 UNIT capsule Take 100 Units by mouth daily.    ? omeprazole (PRILOSEC) 40 MG capsule Take 40 mg by mouth daily.    ? ?No current facility-administered medications for this visit.  ? ? ?Allergies:   Prednisone, Acyclovir and related, Augmentin [amoxicillin-pot clavulanate], Clonidine derivatives, and Percocet [oxycodone-acetaminophen]  ?  ?ROS:  Please see the history  of present illness.   Otherwise, review of systems is positive for noen.   All other systems are reviewed and negative.  ? ? ?PHYSICAL EXAM: ?VS:  BP (!) 142/78   Pulse 62   Ht 5' 3.25" (1.607 m)   Wt 152 lb (68.9 kg)   SpO2 96%   BMI 26.71 kg/m?  , BMI Body mass index is 26.71 kg/m?. ?Well developed and well nourished in no acute distress ?HENT normal ?Neck supple with JVP-flat ?Clear ?Device pocket well healed; without hematoma or erythema.  There is no tethering  ?Regular rate and rhythm, no  murmur ?Abd-soft with active BS ?No Clubbing cyanosis  edema ?Skin-warm and dry ?A & Oriented  Grossly normal sensory and motor function ? ?ECG sinus P-synchronous/ AV   pacing ?Upright QRS V1 and neg 1 ? ? ?  ? ? ?ASSESSMENT AND PLAN: ? ?Complete heart block ? ?Hypertension ? ?sleep disordered breathing and daytime somnolence ? ?Cardiomyopathy-improved  ? ?CRT-D-Medtronic  [name] device was interrogated.  The information was reviewed.   ? ?Resolved cardiomyopathy.  We will continue her Entresto and carvedilol.  Reviewed with her the reasons for these medications and the importance of maintaining in the setting of recovered cardiomyopathy and the risk of deterioration in the removal. ? ?Blood pressure is well controlled on repeat, 120 is noted ? ? ? ? ?Signed, ?[name], MD  ?07/10/2021 4:36 PM    ? ? ?[facility] ?[address] ?[address] ?[place] [address] ?[phone] (office) ?[phone] (fax) ?

## 2021-07-10 NOTE — Patient Instructions (Signed)

## 2021-07-11 NOTE — Addendum Note (Signed)
Addended by: Gwendlyn Deutscher on: 07/11/2021 01:24 PM ? ? Modules accepted: Orders ? ?

## 2021-07-14 ENCOUNTER — Ambulatory Visit (INDEPENDENT_AMBULATORY_CARE_PROVIDER_SITE_OTHER): Payer: 59

## 2021-07-14 DIAGNOSIS — Z95810 Presence of automatic (implantable) cardiac defibrillator: Secondary | ICD-10-CM | POA: Diagnosis not present

## 2021-07-14 DIAGNOSIS — I5022 Chronic systolic (congestive) heart failure: Secondary | ICD-10-CM

## 2021-07-16 NOTE — Progress Notes (Signed)
EPIC Encounter for ICM Monitoring ? ?Patient Name: Joanne Bell is a 61 y.o. female ?Date: 07/16/2021 ?Primary Care Physican: [name], MD ?Primary Cardiologist: [name] ?Electrophysiologist: [name] ?Bi-V Pacing:  99.1%    ?07/10/2021 Office weight: 152 lbs                                                        ?  ?Transmission reviewed.  ?  ?Optivol thoracic impedance suggesting normal fluid levels.   ?  ?Prescribed: No diuretic ?  ?Recommendations:  No changes ?  ?Follow-up plan: ICM clinic phone appointment on 08/18/2021.  91 day device clinic remote transmission 07/28/2021.   ?  ?EP/Cardiology Office Visits: Recall 07/05/2022 with [name].   ?  ?Copy of ICM check sent to Dr. [name]. ? ?3 month ICM trend: 07/14/2021. ? ? ? ?12-14 Month ICM trend:  ? ? ? ?[name], RN ?07/16/2021 ?5:03 PM ? ?

## 2021-07-28 ENCOUNTER — Ambulatory Visit (INDEPENDENT_AMBULATORY_CARE_PROVIDER_SITE_OTHER): Payer: BC Managed Care – PPO

## 2021-07-28 DIAGNOSIS — I442 Atrioventricular block, complete: Secondary | ICD-10-CM

## 2021-07-29 LAB — CUP PACEART REMOTE DEVICE CHECK
Battery Remaining Longevity: 17 mo
Battery Voltage: 2.9 V
Brady Statistic AP VP Percent: 18.03 %
Brady Statistic AP VS Percent: 0.01 %
Brady Statistic AS VP Percent: 81.91 %
Brady Statistic AS VS Percent: 0.05 %
Brady Statistic RA Percent Paced: 17.92 %
Brady Statistic RV Percent Paced: 98.59 %
Date Time Interrogation Session: 20230522044224
HighPow Impedance: 63 Ohm
Implantable Lead Implant Date: 20121210
Implantable Lead Implant Date: 20170512
Implantable Lead Implant Date: 20170512
Implantable Lead Location: 753858
Implantable Lead Location: 753859
Implantable Lead Location: 753860
Implantable Lead Model: 4598
Implantable Pulse Generator Implant Date: 20170512
Lead Channel Impedance Value: 361 Ohm
Lead Channel Impedance Value: 361 Ohm
Lead Channel Impedance Value: 399 Ohm
Lead Channel Impedance Value: 399 Ohm
Lead Channel Impedance Value: 399 Ohm
Lead Channel Impedance Value: 456 Ohm
Lead Channel Impedance Value: 513 Ohm
Lead Channel Impedance Value: 551 Ohm
Lead Channel Impedance Value: 646 Ohm
Lead Channel Impedance Value: 665 Ohm
Lead Channel Impedance Value: 817 Ohm
Lead Channel Impedance Value: 817 Ohm
Lead Channel Impedance Value: 836 Ohm
Lead Channel Pacing Threshold Amplitude: 0.5 V
Lead Channel Pacing Threshold Amplitude: 0.75 V
Lead Channel Pacing Threshold Amplitude: 1.625 V
Lead Channel Pacing Threshold Pulse Width: 0.4 ms
Lead Channel Pacing Threshold Pulse Width: 0.4 ms
Lead Channel Pacing Threshold Pulse Width: 0.8 ms
Lead Channel Sensing Intrinsic Amplitude: 11.25 mV
Lead Channel Sensing Intrinsic Amplitude: 11.25 mV
Lead Channel Sensing Intrinsic Amplitude: 2.625 mV
Lead Channel Sensing Intrinsic Amplitude: 2.625 mV
Lead Channel Setting Pacing Amplitude: 1.5 V
Lead Channel Setting Pacing Amplitude: 2 V
Lead Channel Setting Pacing Amplitude: 2.75 V
Lead Channel Setting Pacing Pulse Width: 0.4 ms
Lead Channel Setting Pacing Pulse Width: 0.8 ms
Lead Channel Setting Sensing Sensitivity: 0.3 mV

## 2021-08-01 ENCOUNTER — Telehealth: Payer: Self-pay

## 2021-08-01 ENCOUNTER — Encounter: Payer: Self-pay | Admitting: Family

## 2021-08-01 ENCOUNTER — Ambulatory Visit (INDEPENDENT_AMBULATORY_CARE_PROVIDER_SITE_OTHER): Payer: 59 | Admitting: Family

## 2021-08-01 ENCOUNTER — Ambulatory Visit (HOSPITAL_COMMUNITY)
Admission: RE | Admit: 2021-08-01 | Discharge: 2021-08-01 | Disposition: A | Discharge status: Home / Self Care | Payer: 59 | Source: Ambulatory Visit | Attending: Cardiology | Admitting: Cardiology

## 2021-08-01 VITALS — BP 145/74 | HR 54 | Temp 97.8°F | Ht 63.0 in | Wt 149.5 lb

## 2021-08-01 DIAGNOSIS — Z1211 Encounter for screening for malignant neoplasm of colon: Secondary | ICD-10-CM | POA: Insufficient documentation

## 2021-08-01 DIAGNOSIS — M25561 Pain in right knee: Secondary | ICD-10-CM | POA: Diagnosis present

## 2021-08-01 DIAGNOSIS — M25461 Effusion, right knee: Secondary | ICD-10-CM | POA: Diagnosis not present

## 2021-08-01 DIAGNOSIS — M7989 Other specified soft tissue disorders: Secondary | ICD-10-CM | POA: Diagnosis not present

## 2021-08-01 DIAGNOSIS — N951 Menopausal and female climacteric states: Secondary | ICD-10-CM | POA: Insufficient documentation

## 2021-08-01 DIAGNOSIS — M159 Polyosteoarthritis, unspecified: Secondary | ICD-10-CM | POA: Insufficient documentation

## 2021-08-01 NOTE — Telephone Encounter (Signed)
[facility] called and pat is negative for DVT and bakers cyst.

## 2021-08-01 NOTE — Patient Instructions (Signed)
It was very nice to see you today!  I have ordered an Ultrasound of your knee/leg to rule out a blood clot. The radiology office will call you directly. I am also referring your to our Orthopedic office to follow up if the ultrasound is negative. You can apply heat to your knee for up to 54mnute 3 times per day and take '1000mg'$  of Tylenol 3-4 times per day as needed for the pain.     PLEASE NOTE:  If you had any lab tests please let [place] if you have not heard back within a few days. You may see your results on MyChart before we have a chance to review them but we will give you a call once they are reviewed by [place] If we ordered any referrals today, please let [place] if you have not heard from their office within the next week.   Please try these tips to maintain a healthy lifestyle:  Eat most of your calories during the day when you are active. Eliminate processed foods including packaged sweets (pies, cakes, cookies), reduce intake of potatoes, white bread, white pasta, and white rice. Look for whole grain options, oat flour or almond flour.  Each meal should contain half fruits/vegetables, one quarter protein, and one quarter carbs (no bigger than a computer mouse).  Cut down on sweet beverages. This includes juice, soda, and sweet tea. Also watch fruit intake, though this is a healthier sweet option, it still contains natural sugar! Limit to 3 servings daily.  Drink at least 1 glass of water with each meal and aim for at least 8 glasses per day  Exercise at least 150 minutes every week.

## 2021-08-01 NOTE — Telephone Encounter (Signed)
I called and Lvm for patient.

## 2021-08-01 NOTE — Telephone Encounter (Signed)
please call pt and let her know - also to continue the plan we discussed and f/u with orthopedic office when they call next week.   thanks!

## 2021-08-01 NOTE — Progress Notes (Signed)
Subjective:     Patient ID: Joanne Bell, female    DOB: Jul 25, 1960, 61 y.o.   MRN: [id number]  Chief Complaint  Patient presents with   Knee Pain    Pt c/o Achy, sharp shooting pain in back of right knee and calf. Present for about 2 days. Unable to bare weight and walk. Has not tried anything on it. Feels tight and achy all day but shooting sharp pain when moving it.    HPI: Knee Pain: Patient presents with knee pain involving the  right knee. Onset of the symptoms was several days ago. Inciting event:  pt reports moving boxes at home, walks daily for exercise . Current symptoms include pain located posterior knee, stiffness, and swelling. Pain is aggravated by any weight bearing, rising after sitting, and unable to bend knee to 90 degrees .  Patient has had no prior knee problems. Evaluation to date: none. Treatment to date: none. Pt denies trying any OTC med or modality for the pain.  Assessment & Plan:   Problem List Items Addressed This Visit   None Visit Diagnoses     Posterior knee pain, right    -  Primary pain started 3 days ago, but last night she was unable to bear weight, no erythema or warmth, low likelihood of clot, but due to sudden onset will order U/S. Also sending referral to [facility]. Advised ok to apply heat up to 29mn tid and Tylenol 1,'000mg'$  tid prn pain.    Relevant Orders   [place] Img Lower Unilateral Right      Outpatient Medications Prior to Visit  Medication Sig Dispense Refill   carvedilol (COREG) 25 MG tablet Take 1 tablet (25 mg total) by mouth 2 (two) times daily. Call and schedule appointment with operator to receive further refills. Thank you. [phone] tablet 0   omeprazole (PRILOSEC) 40 MG capsule Take 40 mg by mouth daily.     sacubitril-valsartan (ENTRESTO) 97-103 MG Take 0.5 tablets by mouth 2 (two) times daily. 90 tablet 3   vitamin E 100 UNIT capsule Take 100 Units by mouth daily.     benzonatate (TESSALON PERLES) 100 MG  capsule Take 1 capsule (100 mg total) by mouth 3 (three) times daily as needed. 20 capsule 0   doxycycline (VIBRA-TABS) 100 MG tablet Take 1 tablet (100 mg total) by mouth 2 (two) times daily. 14 tablet 0   omeprazole (PRILOSEC) 20 MG capsule Take 1 capsule (20 mg total) by mouth as needed (GERD). 30 capsule 3   No facility-administered medications prior to visit.    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 MWatt Climes adenomatous polyps, repeat in 5 yrs   ENDOMETRIAL ABLATION  03/09/2002   EP IMPLANTABLE DEVICE N/A 07/19/2015   [place]: [place] ;  Surgeon: SDeboraha Sprang MD;  [facility];  Service: Cardiovascular;  Laterality: N/A;   MYOMECTOMY  03/09/1996   per Dr. CGarwin Brothers  PERMANENT PACEMAKER INSERTION Left 02/16/2011   Procedure: PERMANENT PACEMAKER INSERTION;  Surgeon: Deboraha Sprang, MD;  Location: [facility];  Service: Cardiovascular;  Laterality: Left;    Allergies  Allergen Reactions   Prednisone     Insomnia, headache and sweats   Acyclovir And Related Other (See Comments)    Reaction unknown to patient   Augmentin [Amoxicillin-Pot Clavulanate] Nausea And Vomiting   Clonidine Derivatives Other (See Comments)    Pt states "it makes me irritable"   Percocet [Oxycodone-Acetaminophen] Other (See Comments)    Dizziness, sweating, abdominal discomfort       Objective:    Physical Exam Vitals and nursing note reviewed.  Constitutional:      Appearance: Normal appearance.  Cardiovascular:     Rate and Rhythm:  Normal rate and regular rhythm.  Pulmonary:     Effort: Pulmonary effort is normal.     Breath sounds: Normal breath sounds.  Musculoskeletal:     Right knee: Swelling (posterior, very mild) present. No erythema or ecchymosis. Decreased range of motion. No tenderness.  Skin:    General: Skin is warm and dry.  Neurological:     Mental Status: She is alert.  Psychiatric:        Mood and Affect: Mood normal.        Behavior: Behavior normal.    BP (!) 145/74 (BP Location: Right Arm, Patient Position: Sitting, Cuff Size: Large)   Pulse (!) 54   Temp 97.8 F (36.6 C) (Temporal)   Ht '5\' 3"'$  (1.6 m)   Wt 149 lb 8 oz (67.8 kg)   SpO2 97%   BMI 26.48 kg/m  Wt Readings from Last 3 Encounters:  08/01/21 149 lb 8 oz (67.8 kg)  07/10/21 152 lb (68.9 kg)  04/11/21 156 lb (70.8 kg)    Jeanie Sewer, NP

## 2021-08-13 ENCOUNTER — Ambulatory Visit: Payer: 59 | Admitting: Orthopaedic Surgery

## 2021-08-13 NOTE — Progress Notes (Signed)
Remote ICD transmission.   

## 2021-08-18 ENCOUNTER — Ambulatory Visit (INDEPENDENT_AMBULATORY_CARE_PROVIDER_SITE_OTHER): Payer: 59

## 2021-08-18 DIAGNOSIS — Z95810 Presence of automatic (implantable) cardiac defibrillator: Secondary | ICD-10-CM | POA: Diagnosis not present

## 2021-08-18 DIAGNOSIS — I5022 Chronic systolic (congestive) heart failure: Secondary | ICD-10-CM

## 2021-08-22 NOTE — Progress Notes (Signed)
EPIC Encounter for ICM Monitoring  Patient Name: Joanne Bell is a 61 y.o. female Date: 08/22/2021 Primary Care Physican: [name], MD Primary Cardiologist: [name] Electrophysiologist: Vergie Living Pacing:  99.1%    07/10/2021 Office weight: 152 lbs                                                          Transmission reviewed.    Optivol thoracic impedance suggesting normal fluid levels.     Prescribed: No diuretic   Recommendations:  No changes   Follow-up plan: ICM clinic phone appointment on 09/22/2021.  91 day device clinic remote transmission 10/27/2021.     EP/Cardiology Office Visits: Recall 07/05/2022 with [name].     Copy of ICM check sent to Dr. [name].  3 month ICM trend: 08/18/2021.    12-14 Month ICM trend:     [name], RN 08/22/2021 1:30 PM

## 2021-08-30 ENCOUNTER — Other Ambulatory Visit: Payer: Self-pay | Admitting: Internal Medicine

## 2021-09-22 ENCOUNTER — Ambulatory Visit (INDEPENDENT_AMBULATORY_CARE_PROVIDER_SITE_OTHER): Payer: 59

## 2021-09-22 DIAGNOSIS — I5022 Chronic systolic (congestive) heart failure: Secondary | ICD-10-CM | POA: Diagnosis not present

## 2021-09-22 DIAGNOSIS — Z95810 Presence of automatic (implantable) cardiac defibrillator: Secondary | ICD-10-CM | POA: Diagnosis not present

## 2021-09-26 NOTE — Progress Notes (Signed)
EPIC Encounter for ICM Monitoring  Patient Name: Joanne Bell is a 61 y.o. female Date: 09/26/2021 Primary Care Physican: [name], MD Primary Cardiologist: [name] Electrophysiologist: Vergie Living Pacing:  99.0%    07/10/2021 Office weight: 152 lbs                                                          Transmission reviewed.    Optivol thoracic impedance suggesting normal fluid levels.     Prescribed: No diuretic   Recommendations:  No changes   Follow-up plan: ICM clinic phone appointment on 10/28/2021.  91 day device clinic remote transmission 10/27/2021.     EP/Cardiology Office Visits: Recall 07/05/2022 with [name].     Copy of ICM check sent to Dr. [name].  3 month ICM trend: 09/22/2021.    12-14 Month ICM trend:     [name], RN 09/26/2021 2:39 PM

## 2021-10-27 ENCOUNTER — Ambulatory Visit (INDEPENDENT_AMBULATORY_CARE_PROVIDER_SITE_OTHER): Payer: 59

## 2021-10-27 DIAGNOSIS — I5022 Chronic systolic (congestive) heart failure: Secondary | ICD-10-CM

## 2021-10-28 ENCOUNTER — Ambulatory Visit (INDEPENDENT_AMBULATORY_CARE_PROVIDER_SITE_OTHER): Payer: 59

## 2021-10-28 DIAGNOSIS — I5022 Chronic systolic (congestive) heart failure: Secondary | ICD-10-CM

## 2021-10-28 DIAGNOSIS — Z95810 Presence of automatic (implantable) cardiac defibrillator: Secondary | ICD-10-CM

## 2021-10-28 LAB — CUP PACEART REMOTE DEVICE CHECK
Battery Remaining Longevity: 15 mo
Battery Voltage: 2.89 V
Brady Statistic AP VP Percent: 17.19 %
Brady Statistic AP VS Percent: 0.01 %
Brady Statistic AS VP Percent: 82.7 %
Brady Statistic AS VS Percent: 0.11 %
Brady Statistic RA Percent Paced: 17.15 %
Brady Statistic RV Percent Paced: 99.08 %
Date Time Interrogation Session: 20230821043624
HighPow Impedance: 63 Ohm
Implantable Lead Implant Date: 20121210
Implantable Lead Implant Date: 20170512
Implantable Lead Implant Date: 20170512
Implantable Lead Location: 753858
Implantable Lead Location: 753859
Implantable Lead Location: 753860
Implantable Lead Model: 4598
Implantable Pulse Generator Implant Date: 20170512
Lead Channel Impedance Value: 361 Ohm
Lead Channel Impedance Value: 361 Ohm
Lead Channel Impedance Value: 361 Ohm
Lead Channel Impedance Value: 418 Ohm
Lead Channel Impedance Value: 418 Ohm
Lead Channel Impedance Value: 456 Ohm
Lead Channel Impedance Value: 475 Ohm
Lead Channel Impedance Value: 551 Ohm
Lead Channel Impedance Value: 608 Ohm
Lead Channel Impedance Value: 665 Ohm
Lead Channel Impedance Value: 817 Ohm
Lead Channel Impedance Value: 836 Ohm
Lead Channel Impedance Value: 874 Ohm
Lead Channel Pacing Threshold Amplitude: 0.375 V
Lead Channel Pacing Threshold Amplitude: 0.625 V
Lead Channel Pacing Threshold Amplitude: 1.625 V
Lead Channel Pacing Threshold Pulse Width: 0.4 ms
Lead Channel Pacing Threshold Pulse Width: 0.4 ms
Lead Channel Pacing Threshold Pulse Width: 0.8 ms
Lead Channel Sensing Intrinsic Amplitude: 11.25 mV
Lead Channel Sensing Intrinsic Amplitude: 11.25 mV
Lead Channel Sensing Intrinsic Amplitude: 3 mV
Lead Channel Sensing Intrinsic Amplitude: 3 mV
Lead Channel Setting Pacing Amplitude: 1.5 V
Lead Channel Setting Pacing Amplitude: 2 V
Lead Channel Setting Pacing Amplitude: 2.75 V
Lead Channel Setting Pacing Pulse Width: 0.4 ms
Lead Channel Setting Pacing Pulse Width: 0.8 ms
Lead Channel Setting Sensing Sensitivity: 0.3 mV

## 2021-10-31 NOTE — Progress Notes (Signed)
EPIC Encounter for ICM Monitoring  Patient Name: Joanne Bell is a 60 y.o. female Date: 10/31/2021 Primary Care Physican: [name], MD Primary Cardiologist: [name] Electrophysiologist: Vergie Living Pacing:  99.9%    07/10/2021 Office weight: 152 lbs                                                          Transmission reviewed.    Optivol thoracic impedance suggesting normal fluid levels.     Prescribed: No diuretic   Recommendations:  No changes   Follow-up plan: ICM clinic phone appointment on 12/01/2021.  91 day device clinic remote transmission 01/26/2022.     EP/Cardiology Office Visits: Recall 07/05/2022 with [name].     Copy of ICM check sent to Dr. [name].  3 month ICM trend: 10/28/2021.    12-14 Month ICM trend:     [name], RN 10/31/2021 4:17 PM

## 2021-11-12 ENCOUNTER — Other Ambulatory Visit: Payer: Self-pay | Admitting: Obstetrics and Gynecology

## 2021-11-12 DIAGNOSIS — Z1231 Encounter for screening mammogram for malignant neoplasm of breast: Secondary | ICD-10-CM

## 2021-11-20 ENCOUNTER — Telehealth: Payer: Self-pay | Admitting: Internal Medicine

## 2021-11-20 ENCOUNTER — Encounter: Payer: Self-pay | Admitting: Pharmacist

## 2021-11-20 MED ORDER — SACUBITRIL-VALSARTAN 49-51 MG PO TABS: 1.0000 | ORAL_TABLET | Freq: Two times a day (BID) | ORAL | 3 refills | Status: DC

## 2021-11-20 NOTE — Telephone Encounter (Signed)
This encounter was created in error - please disregard.

## 2021-11-20 NOTE — Telephone Encounter (Signed)
Pt currently takes Entresto 97-'103mg'$  - 1/2 tablet by mouth twice daily.  Confirmed with PharmD, [place] that pt should not be taking 1/2 tablet as it is not scored.  Pt should instead be taking Entresto 49-'51mg'$  - 1 tablet by mouth twice daily.  Rx and patient assistance forms updated and faxed as requested with DOD signature.  Pt contacted re updated dose of Entresto and instructions.  She verbalizes understanding and thanked Therapist, sports for the call.

## 2021-11-20 NOTE — Telephone Encounter (Signed)
**Note De-Identified Joanne Bell Obfuscation** Looks like the pt can use a Entresto $10 co-pay card but she states that she has s/w [employer] pt asst foundation and was advised that she is eligible for approval in their Entresto asst program and that she would rather apply and get it free of charge from [employer] as she will be retiring soon. She states that she has completed her [employer] application and obtained required documents.  I advised her to bring her application with documents to Dr [name] office on [address] in [place] to drop off in the front office and that we will take care of the providers page of her application and will fax all to [employer].  She verbalized understanding and thanked me for my assistance.

## 2021-11-20 NOTE — Telephone Encounter (Signed)
Pt would like a callback from nurse regarding paperwork for medication assistance. Pt wants to know when she is able to bring paperwork in for provider to fill out his portion. Please advise

## 2021-11-20 NOTE — Telephone Encounter (Signed)
Pt c/o medication issue:  1. Name of Medication: sacubitril-valsartan (ENTRESTO) 97-103 MG  2. How are you currently taking this medication (dosage and times per day)? As prescribed   3. Are you having a reaction (difficulty breathing--STAT)?   4. What is your medication issue? Pt needs forms filled out to help pay for this medication. She needs a call back to figure out what the process is.

## 2021-11-20 NOTE — Telephone Encounter (Signed)
**Note De-Identified Albirtha Grinage Obfuscation** The pt left her [employer] pt asst application at the office with documents.  I have completed the providers page of her application and have e-mailed all to Dr [name] nurse so she can print a Entresto 97-103 mg RX for #180 with 3 refills, have our DOD, Dr Ali Lowe sign and date it and the application, and to then fax all to [employer] PA[employer] at the fax number written on the cover letter included.   The pt is requesting a call when her application has been faxed.

## 2021-11-20 NOTE — Telephone Encounter (Signed)
**Note De-Identified Mj Willis Obfuscation** Already addressed this morning. See medication management phone note in the pts chart from today for details.

## 2021-11-24 NOTE — Progress Notes (Signed)
Remote ICD transmission.   

## 2021-11-28 NOTE — Telephone Encounter (Addendum)
**Note De-Identified Joanne Bell Obfuscation** Letter received from [employer] stating that they need the pts POI. Pt ID: [id number]  I checked the pts application and found that we did attach her 2022 W2s when we faxed her application to them on 0/41.  I called [employer] and was advised by Rollene Fare that they did receive the W2 but that the copy is not dark enough to read. Rollene Fare states that they s/w the pt concerning this yesterday and that the pt advised them that she would provide them another copy.

## 2021-12-01 ENCOUNTER — Ambulatory Visit (INDEPENDENT_AMBULATORY_CARE_PROVIDER_SITE_OTHER): Payer: Self-pay

## 2021-12-01 DIAGNOSIS — Z95810 Presence of automatic (implantable) cardiac defibrillator: Secondary | ICD-10-CM

## 2021-12-01 DIAGNOSIS — I5022 Chronic systolic (congestive) heart failure: Secondary | ICD-10-CM

## 2021-12-01 NOTE — Telephone Encounter (Addendum)
**Note De-Identified Zachari Alberta Obfuscation** The pt left her proof of income at the office. I have faxed it to [employer] with a note stating "POI".  Fax: Tx 'ok' Report CONE_EMAIL-to-Fax Liylah Najarro, Mardene Celeste   This message was sent Esti Demello [facility], a product from [employer] http://www.biscom.com/  [employer] pioneered [employer] and continues to innovate the most advanced and intelligent fax and secure messaging solutions for enterprises. www.biscom.com                     -------Fax Transmission Report-------  To:               Recipient at [phone] Subject:          Fw: [employer] PAF Result:           The transmission was successful. Explanation:      All Pages Ok Pages Sent:       4 Connect Time:     3 minutes, 31 seconds Transmit Time:    12/01/2021 08:15 Transfer Rate:    14400 Status Code:      0000 Retry Count:      0 Job Id:           [id number] Unique Id:        [id number] Fax Line:         20 Fax Server:       [id number]

## 2021-12-03 ENCOUNTER — Ambulatory Visit: Payer: 59

## 2021-12-03 IMAGING — MG MM DIGITAL SCREENING BILAT W/ TOMO AND CAD
6 of 10 series · 6 of 30 positions shown · non-contrast
Comparison: Previous exam(s).

CLINICAL DATA: Screening.

EXAM:
DIGITAL SCREENING BILATERAL MAMMOGRAM WITH TOMOSYNTHESIS AND CAD
TECHNIQUE: Bilateral screening digital craniocaudal and mediolateral oblique
mammograms were obtained. Bilateral screening digital breast
tomosynthesis was performed. The images were evaluated with
computer-aided detection.

[R CC synth-2D]
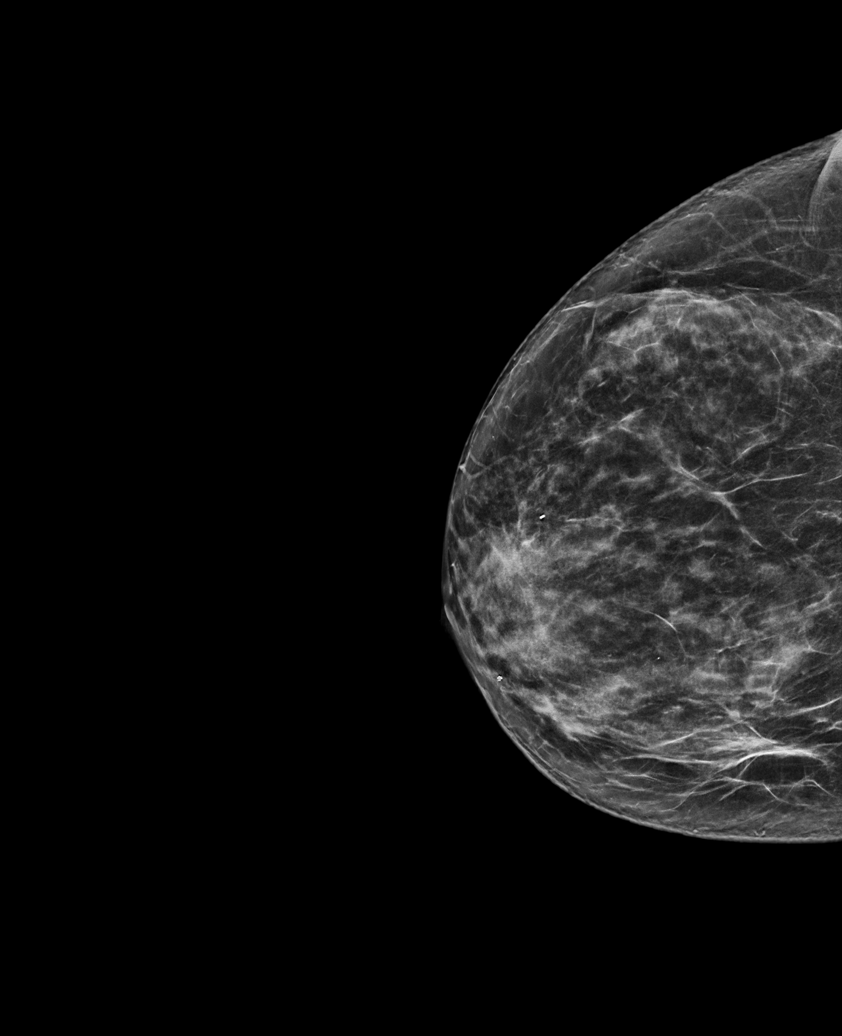

[R MLO synth-2D]
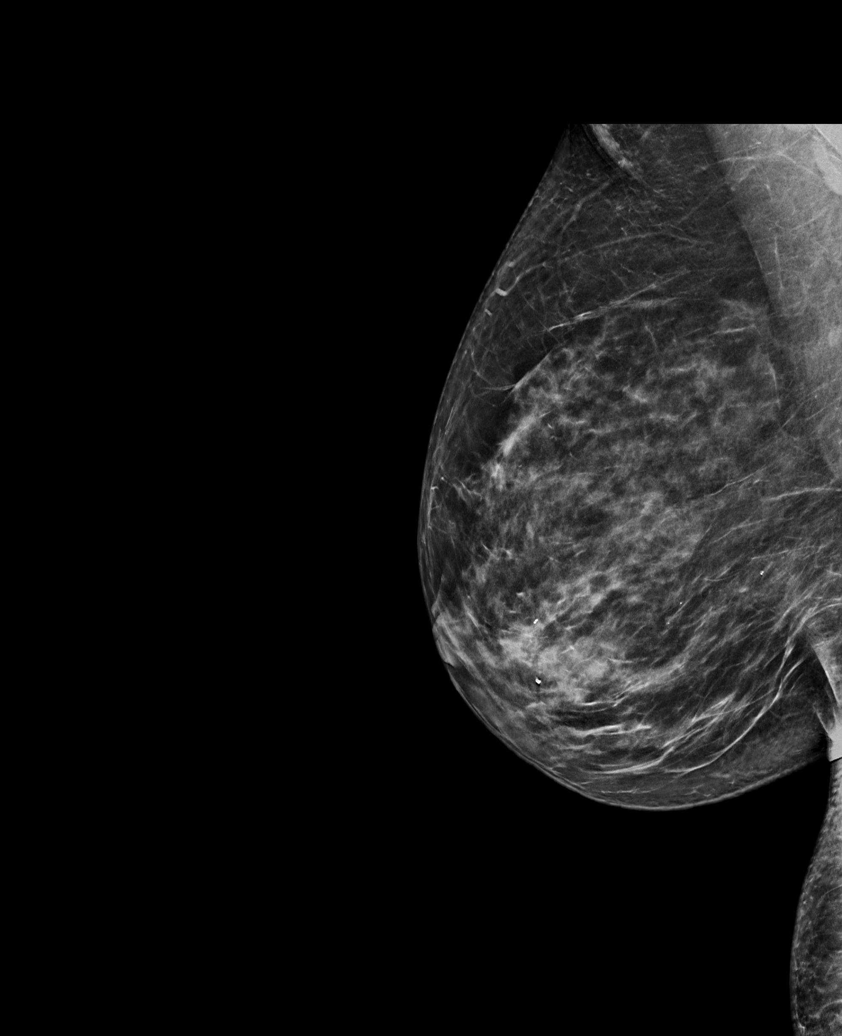

[L CC synth-2D]
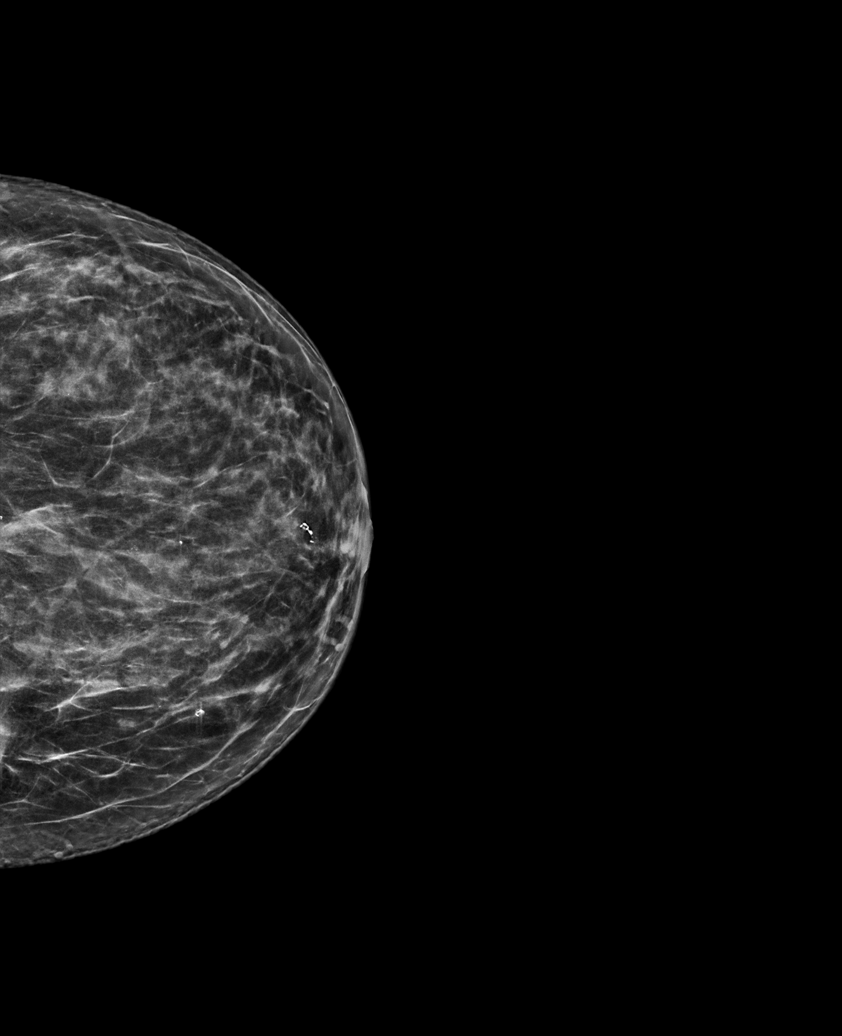

[L MLO synth-2D (1 of 2)]
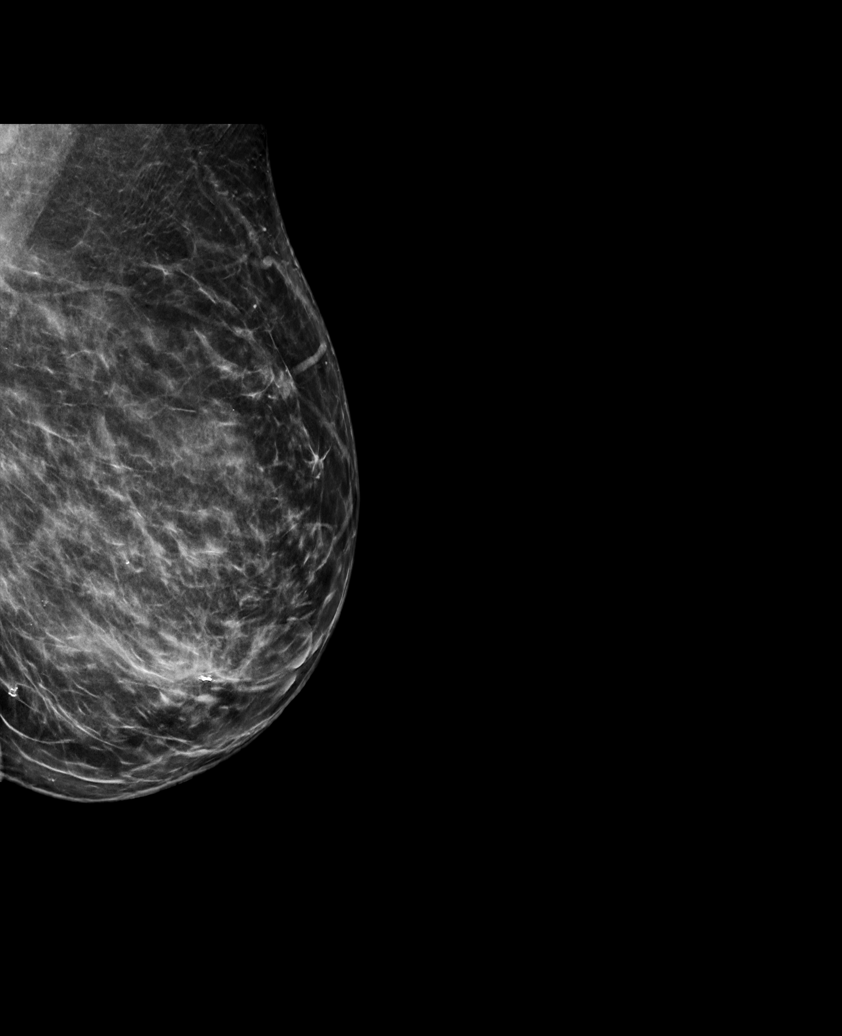

[L MLO synth-2D (2 of 2)]
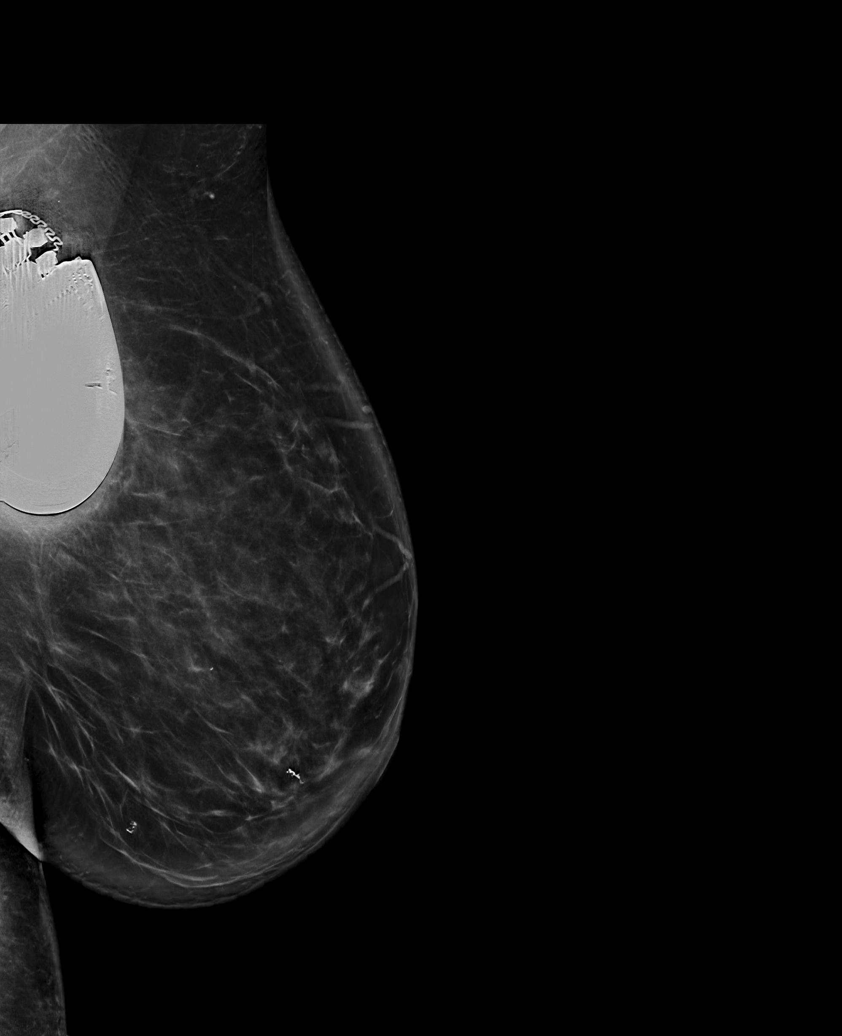

[R CC tomo · tomo slice 37/73.0]
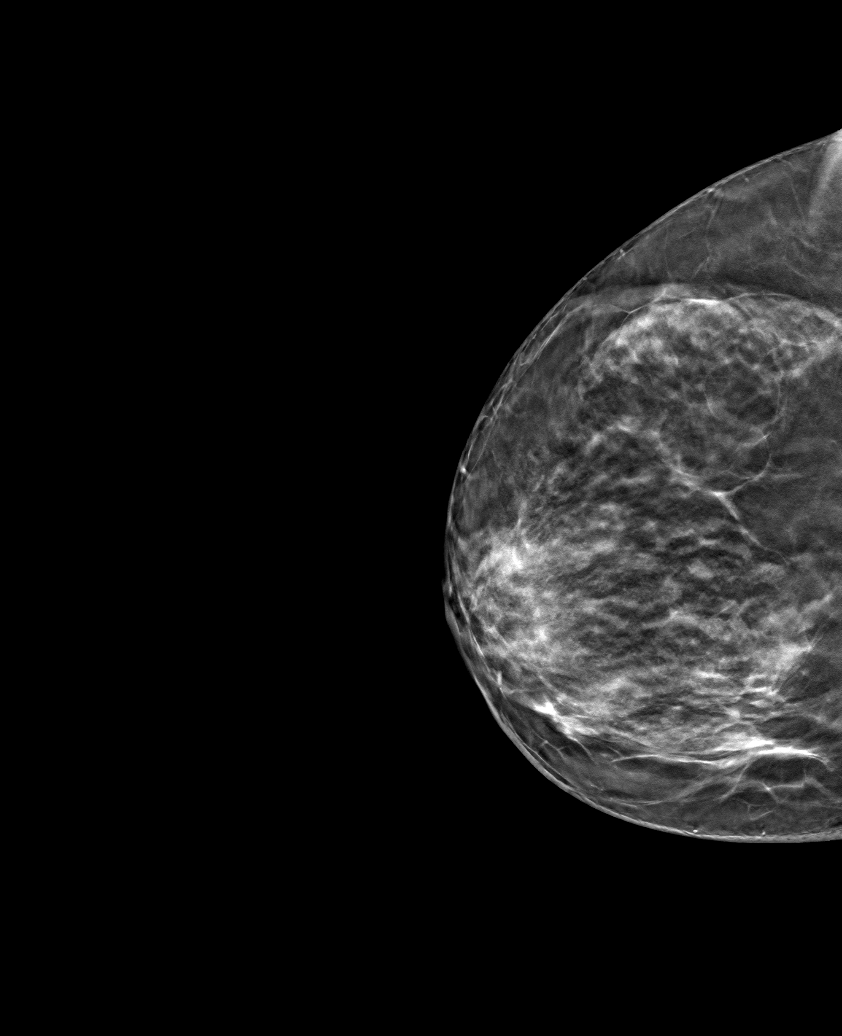

[6 of 30 positions shown; findings below may reference images not displayed]

ACR Breast Density Category c: The breast tissue is heterogeneously
dense, which may obscure small masses.
FINDINGS: There are no findings suspicious for malignancy.
IMPRESSION: No mammographic evidence of malignancy. A result letter of this
screening mammogram will be mailed directly to the patient.

RECOMMENDATION:
Screening mammogram in one year. (Code:Q3-W-BC3)

BI-RADS CATEGORY  1: Negative.

## 2021-12-04 ENCOUNTER — Ambulatory Visit: Payer: 59

## 2021-12-04 NOTE — Progress Notes (Signed)
EPIC Encounter for ICM Monitoring  Patient Name: Joanne Bell is a 61 y.o. female Date: 12/04/2021 Primary Care Physican: [name], MD Primary Cardiologist: [name] Electrophysiologist: Vergie Living Pacing:  98.6%    07/10/2021 Office weight: 152 lbs                                                          Transmission reviewed.    Optivol thoracic impedance suggesting normal fluid levels.     Prescribed: No diuretic   Recommendations:  No changes   Follow-up plan: ICM clinic phone appointment on 01/05/2022.  91 day device clinic remote transmission 01/26/2022.     EP/Cardiology Office Visits: Recall 07/05/2022 with [name].     Copy of ICM check sent to Dr. [name].   3 month ICM trend: 12/01/2021.    12-14 Month ICM trend:     [name], RN 12/04/2021 4:19 PM

## 2021-12-09 NOTE — Telephone Encounter (Signed)
**Note De-Identified Joanne Bell Obfuscation** Per letter received from [employer] Calen Geister fax, they have denied the pt assistance with Entresto. Reason for denial: Household income exceeds program limits.  The letter states that they have notified the pt of this denial as well.

## 2021-12-12 ENCOUNTER — Telehealth: Payer: Self-pay

## 2021-12-12 NOTE — Telephone Encounter (Signed)
Leaving pt 1 bottle of entresto 49-51 mg tablets samples at [facility] office front desk. Lot# [id number]  Exp: 10/2023  thanks, please inform pt.

## 2021-12-12 NOTE — Telephone Encounter (Signed)
**Note De-Identified Florence Antonelli Obfuscation** The pt states that [employer] is allowing her to provide them her 2023 proof of income because her 2022 income is from when she was working and exceeds their income limit and she did retire in early 2023.  She states that she is waiting on one more income document that [employer] advised her that they need and she is expecting it [employer] mail any day now and that she plans on faxing and mailing it to [employer] ASAP once she gets it.  She is advised that we are leaving her 2 more weeks of Entresto 49-51 mg samples in the front office for her to pick up.  She thanked me for our assistance.

## 2021-12-12 NOTE — Telephone Encounter (Signed)
Pt calling stating that she is waiting for pt assistance program to make a decision concerning her appeal. Pt requesting samples of Entresto. Jeani Hawking, LPN, can you please advise on this matter? Thanks

## 2021-12-15 ENCOUNTER — Ambulatory Visit
Admission: RE | Admit: 2021-12-15 | Discharge: 2021-12-15 | Disposition: A | Discharge status: Home / Self Care | Payer: Commercial Managed Care - HMO | Source: Ambulatory Visit | Attending: Obstetrics and Gynecology | Admitting: Obstetrics and Gynecology

## 2021-12-15 DIAGNOSIS — Z1231 Encounter for screening mammogram for malignant neoplasm of breast: Secondary | ICD-10-CM

## 2021-12-24 NOTE — Telephone Encounter (Signed)
**Note De-Identified [name] Obfuscation** Letter received Joanne Bell fax from [employer] PAF stating that they have approved the pt for Entresto assistance until 03/08/2022. Pt ID: [id number]  The letter states that they have notified the pt of this approval as well.

## 2022-01-05 ENCOUNTER — Ambulatory Visit (INDEPENDENT_AMBULATORY_CARE_PROVIDER_SITE_OTHER): Payer: Commercial Managed Care - HMO

## 2022-01-05 DIAGNOSIS — I5022 Chronic systolic (congestive) heart failure: Secondary | ICD-10-CM

## 2022-01-05 DIAGNOSIS — Z95810 Presence of automatic (implantable) cardiac defibrillator: Secondary | ICD-10-CM

## 2022-01-07 NOTE — Progress Notes (Signed)
EPIC Encounter for ICM Monitoring  Patient Name: Joanne Bell is a 61 y.o. female Date: 01/07/2022 Primary Care Physican: [name], MD Primary Cardiologist: [name] Electrophysiologist: Vergie Living Pacing:  99.7%    07/10/2021 Office weight: 152 lbs                                                          Transmission reviewed.    Optivol thoracic impedance suggesting normal fluid levels.     Prescribed: No diuretic   Recommendations:  No changes   Follow-up plan: ICM clinic phone appointment on 02/09/2022.  91 day device clinic remote transmission 01/26/2022.     EP/Cardiology Office Visits: Recall 07/05/2022 with [name].     Copy of ICM check sent to Dr. [name].  3 month ICM trend: 01/05/2022.    12-14 Month ICM trend:     [name], RN 01/07/2022 5:53 AM

## 2022-01-26 ENCOUNTER — Ambulatory Visit (INDEPENDENT_AMBULATORY_CARE_PROVIDER_SITE_OTHER): Payer: BC Managed Care – PPO

## 2022-01-26 DIAGNOSIS — I442 Atrioventricular block, complete: Secondary | ICD-10-CM

## 2022-01-27 ENCOUNTER — Encounter: Payer: Self-pay | Admitting: Internal Medicine

## 2022-01-27 LAB — CUP PACEART REMOTE DEVICE CHECK
Battery Remaining Longevity: 14 mo
Battery Voltage: 2.87 V
Brady Statistic AP VP Percent: 22.61 %
Brady Statistic AP VS Percent: 0.01 %
Brady Statistic AS VP Percent: 77.35 %
Brady Statistic AS VS Percent: 0.02 %
Brady Statistic RA Percent Paced: 22.47 %
Brady Statistic RV Percent Paced: 97.43 %
Date Time Interrogation Session: 20231121003425
HighPow Impedance: 60 Ohm
Implantable Lead Connection Status: 753985
Implantable Lead Connection Status: 753985
Implantable Lead Connection Status: 753985
Implantable Lead Implant Date: 20121210
Implantable Lead Implant Date: 20170512
Implantable Lead Implant Date: 20170512
Implantable Lead Location: 753858
Implantable Lead Location: 753859
Implantable Lead Location: 753860
Implantable Lead Model: 4598
Implantable Pulse Generator Implant Date: 20170512
Lead Channel Impedance Value: 342 Ohm
Lead Channel Impedance Value: 361 Ohm
Lead Channel Impedance Value: 399 Ohm
Lead Channel Impedance Value: 418 Ohm
Lead Channel Impedance Value: 418 Ohm
Lead Channel Impedance Value: 456 Ohm
Lead Channel Impedance Value: 513 Ohm
Lead Channel Impedance Value: 532 Ohm
Lead Channel Impedance Value: 589 Ohm
Lead Channel Impedance Value: 608 Ohm
Lead Channel Impedance Value: 779 Ohm
Lead Channel Impedance Value: 779 Ohm
Lead Channel Impedance Value: 836 Ohm
Lead Channel Pacing Threshold Amplitude: 0.5 V
Lead Channel Pacing Threshold Amplitude: 0.625 V
Lead Channel Pacing Threshold Amplitude: 1.625 V
Lead Channel Pacing Threshold Pulse Width: 0.4 ms
Lead Channel Pacing Threshold Pulse Width: 0.4 ms
Lead Channel Pacing Threshold Pulse Width: 0.8 ms
Lead Channel Sensing Intrinsic Amplitude: 11.25 mV
Lead Channel Sensing Intrinsic Amplitude: 11.25 mV
Lead Channel Sensing Intrinsic Amplitude: 2.375 mV
Lead Channel Sensing Intrinsic Amplitude: 2.375 mV
Lead Channel Setting Pacing Amplitude: 1.5 V
Lead Channel Setting Pacing Amplitude: 2 V
Lead Channel Setting Pacing Amplitude: 2.75 V
Lead Channel Setting Pacing Pulse Width: 0.4 ms
Lead Channel Setting Pacing Pulse Width: 0.8 ms
Lead Channel Setting Sensing Sensitivity: 0.3 mV
Zone Setting Status: 755011
Zone Setting Status: 755011

## 2022-01-27 NOTE — Telephone Encounter (Signed)
Error

## 2022-02-05 ENCOUNTER — Telehealth: Payer: Self-pay

## 2022-02-05 NOTE — Telephone Encounter (Signed)
Application printed, signed, faxed and placed for scan.

## 2022-02-05 NOTE — Telephone Encounter (Signed)
**Note De-Identified Joanne Bell Obfuscation** The pts completed [employer]PA[employer] application for Entresto assistance was left at the office with documents.  I have completed the provides page of his application and have e-mailed all to Dr [name] nurse so she can obtain his signature, date it, and to fax all to [employer] at the fax number written on thr cover letter included.

## 2022-02-09 ENCOUNTER — Ambulatory Visit (INDEPENDENT_AMBULATORY_CARE_PROVIDER_SITE_OTHER): Payer: Commercial Managed Care - HMO

## 2022-02-09 DIAGNOSIS — Z95810 Presence of automatic (implantable) cardiac defibrillator: Secondary | ICD-10-CM

## 2022-02-09 DIAGNOSIS — I5022 Chronic systolic (congestive) heart failure: Secondary | ICD-10-CM | POA: Diagnosis not present

## 2022-02-16 NOTE — Progress Notes (Signed)
EPIC Encounter for ICM Monitoring  Patient Name: Joanne Bell is a 61 y.o. female Date: 02/16/2022 Primary Care Physican: [name], MD Primary Cardiologist: [name] Electrophysiologist: Vergie Living Pacing:  97.3%    07/10/2021 Office weight: 152 lbs                                                          Transmission reviewed.    Optivol thoracic impedance suggesting normal fluid levels.     Prescribed: No diuretic   Recommendations:  No changes   Follow-up plan: ICM clinic phone appointment on 03/23/2022.  91 day device clinic remote transmission 04/27/2022.     EP/Cardiology Office Visits: Recall 07/05/2022 with [name].     Copy of ICM check sent to Dr. [name].   3 month ICM trend: 02/09/2022.    12-14 Month ICM trend:     Joanne Billings, RN 02/16/2022 12:27 PM

## 2022-02-27 ENCOUNTER — Ambulatory Visit: Payer: Commercial Managed Care - HMO

## 2022-03-12 NOTE — Progress Notes (Signed)
Remote ICD transmission.   

## 2022-03-23 ENCOUNTER — Ambulatory Visit (INDEPENDENT_AMBULATORY_CARE_PROVIDER_SITE_OTHER): Payer: Commercial Managed Care - HMO

## 2022-03-23 DIAGNOSIS — Z95810 Presence of automatic (implantable) cardiac defibrillator: Secondary | ICD-10-CM | POA: Diagnosis not present

## 2022-03-23 DIAGNOSIS — I5022 Chronic systolic (congestive) heart failure: Secondary | ICD-10-CM | POA: Diagnosis not present

## 2022-03-26 NOTE — Progress Notes (Signed)
EPIC Encounter for ICM Monitoring  Patient Name: Joanne Bell is a 62 y.o. female Date: 03/26/2022 Primary Care Physican: [name], MD Primary Cardiologist: [name] Electrophysiologist: Vergie Living Pacing:  96.8%    07/10/2021 Office weight: 152 lbs                                                          Transmission reviewed.    Optivol thoracic impedance suggesting normal fluid levels with possible fluid accumulation 12/24-12/26.     Prescribed: No diuretic   Recommendations:  No changes   Follow-up plan: ICM clinic phone appointment on 04/28/2022.  91 day device clinic remote transmission 04/27/2022.     EP/Cardiology Office Visits: Recall 07/05/2022 with [name].     Copy of ICM check sent to Dr. [name].  3 month ICM trend: 03/23/2022.    12-14 Month ICM trend:     [name], RN 03/26/2022 9:29 AM

## 2022-04-15 ENCOUNTER — Encounter (INDEPENDENT_AMBULATORY_CARE_PROVIDER_SITE_OTHER): Payer: Self-pay

## 2022-04-27 ENCOUNTER — Ambulatory Visit: Payer: Commercial Managed Care - HMO

## 2022-04-27 DIAGNOSIS — I5022 Chronic systolic (congestive) heart failure: Secondary | ICD-10-CM | POA: Diagnosis not present

## 2022-04-27 LAB — CUP PACEART REMOTE DEVICE CHECK
Battery Remaining Longevity: 11 mo
Battery Voltage: 2.86 V
Brady Statistic AP VP Percent: 19.74 %
Brady Statistic AP VS Percent: 0.01 %
Brady Statistic AS VP Percent: 80.21 %
Brady Statistic AS VS Percent: 0.04 %
Brady Statistic RA Percent Paced: 19.68 %
Brady Statistic RV Percent Paced: 98.46 %
Date Time Interrogation Session: 20240219012507
HighPow Impedance: 51 Ohm
Implantable Lead Connection Status: 753985
Implantable Lead Connection Status: 753985
Implantable Lead Connection Status: 753985
Implantable Lead Implant Date: 20121210
Implantable Lead Implant Date: 20170512
Implantable Lead Implant Date: 20170512
Implantable Lead Location: 753858
Implantable Lead Location: 753859
Implantable Lead Location: 753860
Implantable Lead Model: 4598
Implantable Pulse Generator Implant Date: 20170512
Lead Channel Impedance Value: 342 Ohm
Lead Channel Impedance Value: 342 Ohm
Lead Channel Impedance Value: 361 Ohm
Lead Channel Impedance Value: 399 Ohm
Lead Channel Impedance Value: 399 Ohm
Lead Channel Impedance Value: 418 Ohm
Lead Channel Impedance Value: 475 Ohm
Lead Channel Impedance Value: 532 Ohm
Lead Channel Impedance Value: 589 Ohm
Lead Channel Impedance Value: 608 Ohm
Lead Channel Impedance Value: 722 Ohm
Lead Channel Impedance Value: 760 Ohm
Lead Channel Impedance Value: 779 Ohm
Lead Channel Pacing Threshold Amplitude: 0.5 V
Lead Channel Pacing Threshold Amplitude: 0.625 V
Lead Channel Pacing Threshold Amplitude: 1.625 V
Lead Channel Pacing Threshold Pulse Width: 0.4 ms
Lead Channel Pacing Threshold Pulse Width: 0.4 ms
Lead Channel Pacing Threshold Pulse Width: 0.8 ms
Lead Channel Sensing Intrinsic Amplitude: 1.125 mV
Lead Channel Sensing Intrinsic Amplitude: 1.125 mV
Lead Channel Sensing Intrinsic Amplitude: 3.25 mV
Lead Channel Sensing Intrinsic Amplitude: 3.25 mV
Lead Channel Setting Pacing Amplitude: 1.5 V
Lead Channel Setting Pacing Amplitude: 2 V
Lead Channel Setting Pacing Amplitude: 2.75 V
Lead Channel Setting Pacing Pulse Width: 0.4 ms
Lead Channel Setting Pacing Pulse Width: 0.8 ms
Lead Channel Setting Sensing Sensitivity: 0.3 mV
Zone Setting Status: 755011
Zone Setting Status: 755011

## 2022-04-28 ENCOUNTER — Ambulatory Visit: Payer: Commercial Managed Care - HMO

## 2022-04-28 DIAGNOSIS — I5022 Chronic systolic (congestive) heart failure: Secondary | ICD-10-CM

## 2022-04-28 DIAGNOSIS — Z95810 Presence of automatic (implantable) cardiac defibrillator: Secondary | ICD-10-CM

## 2022-05-01 NOTE — Progress Notes (Signed)
EPIC Encounter for ICM Monitoring  Patient Name: Joanne Bell is a 62 y.o. female Date: 05/01/2022 Primary Care Physican: [name], MD Primary Cardiologist: [name] Electrophysiologist: Vergie Living Pacing:  98.5%    07/10/2021 Office weight: 152 lbs                                                          Transmission reviewed.    Optivol thoracic impedance suggesting normal fluid levels.     Prescribed: No diuretic   Recommendations:  No changes   Follow-up plan: ICM clinic phone appointment on 06/01/2022.  91 day device clinic remote transmission 07/27/2022.     EP/Cardiology Office Visits: Recall 07/05/2022 with [name].     Copy of ICM check sent to Dr. [name].   3 month ICM trend: 05/01/2022.    12-14 Month ICM trend:     [name], RN 05/01/2022 1:16 PM

## 2022-06-01 ENCOUNTER — Ambulatory Visit: Payer: Commercial Managed Care - HMO | Attending: Internal Medicine

## 2022-06-01 DIAGNOSIS — Z95810 Presence of automatic (implantable) cardiac defibrillator: Secondary | ICD-10-CM

## 2022-06-01 DIAGNOSIS — I5022 Chronic systolic (congestive) heart failure: Secondary | ICD-10-CM

## 2022-06-05 NOTE — Progress Notes (Signed)
EPIC Encounter for ICM Monitoring  Patient Name: Joanne Bell is a 62 y.o. female Date: 06/05/2022 Primary Care Physican: [name], MD Primary Cardiologist: [name] Electrophysiologist: Vergie Living Pacing:  99.3%    07/10/2021 Office weight: 152 lbs           Battery Longevity: 9 months                                                 Transmission reviewed.    Optivol thoracic impedance suggesting normal fluid levels.     Prescribed: No diuretic   Recommendations:  No changes   Follow-up plan: ICM clinic phone appointment on 07/06/2022.  91 day device clinic remote transmission 07/27/2022.     EP/Cardiology Office Visits: Recall 07/05/2022 with [name].     Copy of ICM check sent to Dr. [name].    3 month ICM trend: 06/02/2022.    12-14 Month ICM trend:     [name], RN 06/05/2022 10:49 AM

## 2022-06-08 ENCOUNTER — Other Ambulatory Visit: Payer: Self-pay | Admitting: Family Medicine

## 2022-06-08 DIAGNOSIS — Z95810 Presence of automatic (implantable) cardiac defibrillator: Secondary | ICD-10-CM

## 2022-06-09 ENCOUNTER — Ambulatory Visit (INDEPENDENT_AMBULATORY_CARE_PROVIDER_SITE_OTHER): Payer: Commercial Managed Care - HMO | Admitting: Family Medicine

## 2022-06-09 ENCOUNTER — Encounter: Payer: Self-pay | Admitting: Family Medicine

## 2022-06-09 VITALS — BP 118/82 | HR 52 | Temp 97.6°F | Ht 64.5 in | Wt 156.2 lb

## 2022-06-09 DIAGNOSIS — Z0000 Encounter for general adult medical examination without abnormal findings: Secondary | ICD-10-CM

## 2022-06-09 NOTE — Progress Notes (Signed)
   Subjective:    Patient ID: Joanne Bell, female    DOB: 1960-09-19, 62 y.o.   MRN: [id number]  HPI Here for a well exam. She feels well although she does have some constipation. She averages 2 BM's a week. She often feels bloated, but she does not have pain. She sees Dr. [name] once a year for her cardiomyopathy and her complete heart block (she is S/P placment of a biventricular pacer).    Review of Systems  Constitutional: Negative.   HENT: Negative.    Eyes: Negative.   Respiratory: Negative.    Cardiovascular: Negative.   Gastrointestinal:  Positive for constipation.  Genitourinary:  Negative for decreased urine volume, difficulty urinating, dyspareunia, dysuria, enuresis, flank pain, frequency, hematuria, pelvic pain and urgency.  Musculoskeletal: Negative.   Skin: Negative.   Neurological: Negative.  Negative for headaches.  Psychiatric/Behavioral: Negative.         Objective:   Physical Exam Constitutional:      General: She is not in acute distress.    Appearance: Normal appearance. She is well-developed.  HENT:     Head: Normocephalic and atraumatic.     Right Ear: External ear normal.     Left Ear: External ear normal.     Nose: Nose normal.     Mouth/Throat:     Pharynx: No oropharyngeal exudate.  Eyes:     General: No scleral icterus.    Conjunctiva/sclera: Conjunctivae normal.     Pupils: Pupils are equal, round, and reactive to light.  Neck:     Thyroid: No thyromegaly.     Vascular: No JVD.  Cardiovascular:     Rate and Rhythm: Normal rate and regular rhythm.     Heart sounds: Normal heart sounds. No murmur heard.    No friction rub. No gallop.  Pulmonary:     Effort: Pulmonary effort is normal. No respiratory distress.     Breath sounds: Normal breath sounds. No wheezing or rales.  Chest:     Chest wall: No tenderness.  Abdominal:     General: Bowel sounds are normal. There is no distension.     Palpations: Abdomen is soft. There is no  mass.     Tenderness: There is no abdominal tenderness. There is no guarding or rebound.  Musculoskeletal:        General: No tenderness. Normal range of motion.     Cervical back: Normal range of motion and neck supple.  Lymphadenopathy:     Cervical: No cervical adenopathy.  Skin:    General: Skin is warm and dry.     Findings: No erythema or rash.  Neurological:     Mental Status: She is alert and oriented to person, place, and time.     Cranial Nerves: No cranial nerve deficit.     Motor: No abnormal muscle tone.     Coordination: Coordination normal.     Deep Tendon Reflexes: Reflexes are normal and symmetric. Reflexes normal.  Psychiatric:        Behavior: Behavior normal.        Thought Content: Thought content normal.        Judgment: Judgment normal.           Assessment & Plan:  Well exam. We discussed diet and exercise. Get fasting labs. For the constipation, I suggested she try taking Miralax daily.  Alysia Penna, MD

## 2022-06-10 ENCOUNTER — Other Ambulatory Visit (INDEPENDENT_AMBULATORY_CARE_PROVIDER_SITE_OTHER): Payer: Commercial Managed Care - HMO

## 2022-06-10 ENCOUNTER — Other Ambulatory Visit: Payer: Commercial Managed Care - HMO

## 2022-06-10 DIAGNOSIS — Z0000 Encounter for general adult medical examination without abnormal findings: Secondary | ICD-10-CM

## 2022-06-10 LAB — HEPATIC FUNCTION PANEL
ALT: 12 U/L (ref 0–35)
AST: 16 U/L (ref 0–37)
Albumin: 4.5 g/dL (ref 3.5–5.2)
Alkaline Phosphatase: 69 U/L (ref 39–117)
Bilirubin, Direct: 0 mg/dL (ref 0.0–0.3)
Total Bilirubin: 0.4 mg/dL (ref 0.2–1.2)
Total Protein: 7.1 g/dL (ref 6.0–8.3)

## 2022-06-10 LAB — BASIC METABOLIC PANEL
BUN: 10 mg/dL (ref 6–23)
CO2: 28 mEq/L (ref 19–32)
Calcium: 9.7 mg/dL (ref 8.4–10.5)
Chloride: 104 mEq/L (ref 96–112)
Creatinine, Ser: 0.82 mg/dL (ref 0.40–1.20)
GFR: 75.87 mL/min (ref >60.00)
Glucose, Bld: 93 mg/dL (ref 70–99)
Potassium: 4 mEq/L (ref 3.5–5.1)
Sodium: 140 mEq/L (ref 135–145)

## 2022-06-10 LAB — CBC WITH DIFFERENTIAL/PLATELET
Basophils Absolute: 0 10*3/uL (ref 0.0–0.1)
Basophils Relative: 0.4 % (ref 0.0–3.0)
Eosinophils Absolute: 0.1 10*3/uL (ref 0.0–0.7)
Eosinophils Relative: 1.2 % (ref 0.0–5.0)
HCT: 35.5 % — ABNORMAL LOW (ref 36.0–46.0)
Hemoglobin: 11.6 g/dL — ABNORMAL LOW (ref 12.0–15.0)
Lymphocytes Relative: 53.1 % — ABNORMAL HIGH (ref 12.0–46.0)
Lymphs Abs: 2.4 10*3/uL (ref 0.7–4.0)
MCHC: 32.6 g/dL (ref 30.0–36.0)
MCV: 83.6 fl (ref 78.0–100.0)
Monocytes Absolute: 0.3 10*3/uL (ref 0.1–1.0)
Monocytes Relative: 7.3 % (ref 3.0–12.0)
Neutro Abs: 1.7 10*3/uL (ref 1.4–7.7)
Neutrophils Relative %: 38 % — ABNORMAL LOW (ref 43.0–77.0)
Platelets: 219 10*3/uL (ref 150.0–400.0)
RBC: 4.25 Mil/uL (ref 3.87–5.11)
RDW: 14 % (ref 11.5–15.5)
WBC: 4.6 10*3/uL (ref 4.0–10.5)

## 2022-06-10 LAB — HEMOGLOBIN A1C: Hgb A1c MFr Bld: 6.5 % (ref 4.6–6.5)

## 2022-06-10 LAB — LIPID PANEL
Cholesterol: 225 mg/dL — ABNORMAL HIGH (ref 0–200)
HDL: 55.9 mg/dL (ref >39.00)
LDL Cholesterol: 149 mg/dL — ABNORMAL HIGH (ref 0–99)
NonHDL: 169.19
Total CHOL/HDL Ratio: 4
Triglycerides: 101 mg/dL (ref 0.0–149.0)
VLDL: 20.2 mg/dL (ref 0.0–40.0)

## 2022-06-10 LAB — TSH: TSH: 1.76 u[IU]/mL (ref 0.35–5.50)

## 2022-06-10 NOTE — Progress Notes (Signed)
Remote ICD transmission.   

## 2022-07-06 ENCOUNTER — Ambulatory Visit: Payer: Commercial Managed Care - HMO | Attending: Internal Medicine

## 2022-07-06 DIAGNOSIS — Z95810 Presence of automatic (implantable) cardiac defibrillator: Secondary | ICD-10-CM

## 2022-07-06 DIAGNOSIS — I5022 Chronic systolic (congestive) heart failure: Secondary | ICD-10-CM | POA: Diagnosis not present

## 2022-07-07 NOTE — Progress Notes (Signed)
EPIC Encounter for ICM Monitoring  Patient Name: Joanne Bell is a 62 y.o. female Date: 07/07/2022 Primary Care Physican: Nelwyn Salisbury, MD Primary Cardiologist: Graciela Husbands Electrophysiologist: Joycelyn Schmid Pacing:  98.8%    07/10/2021 Office weight: 152 lbs            Battery Longevity: 8 months                                                 Transmission reviewed.    Optivol thoracic impedance suggesting normal fluid levels.     Prescribed: No diuretic   Recommendations:  No changes   Follow-up plan: ICM clinic phone appointment on 08/10/2022.  91 day device clinic remote transmission 07/27/2022.     EP/Cardiology Office Visits: 09/17/2022 with Dr Graciela Husbands.     Copy of ICM check sent to Dr. Graciela Husbands.     3 month ICM trend: 07/06/2022.    12-14 Month ICM trend:     Karie Soda, RN 07/07/2022 5:05 PM

## 2022-07-12 ENCOUNTER — Encounter: Payer: Self-pay | Admitting: Internal Medicine

## 2022-07-27 ENCOUNTER — Ambulatory Visit (INDEPENDENT_AMBULATORY_CARE_PROVIDER_SITE_OTHER): Payer: Commercial Managed Care - HMO

## 2022-07-27 DIAGNOSIS — I442 Atrioventricular block, complete: Secondary | ICD-10-CM | POA: Diagnosis not present

## 2022-07-27 LAB — CUP PACEART REMOTE DEVICE CHECK
Battery Remaining Longevity: 8 mo
Battery Voltage: 2.84 V
Brady Statistic AP VP Percent: 14.56 %
Brady Statistic AP VS Percent: 0.01 %
Brady Statistic AS VP Percent: 85.4 %
Brady Statistic AS VS Percent: 0.03 %
Brady Statistic RA Percent Paced: 14.56 %
Brady Statistic RV Percent Paced: 99.81 %
Date Time Interrogation Session: 20240520033324
HighPow Impedance: 60 Ohm
Implantable Lead Connection Status: 753985
Implantable Lead Connection Status: 753985
Implantable Lead Connection Status: 753985
Implantable Lead Implant Date: 20121210
Implantable Lead Implant Date: 20170512
Implantable Lead Implant Date: 20170512
Implantable Lead Location: 753858
Implantable Lead Location: 753859
Implantable Lead Location: 753860
Implantable Lead Model: 4598
Implantable Pulse Generator Implant Date: 20170512
Lead Channel Impedance Value: 342 Ohm
Lead Channel Impedance Value: 342 Ohm
Lead Channel Impedance Value: 361 Ohm
Lead Channel Impedance Value: 399 Ohm
Lead Channel Impedance Value: 418 Ohm
Lead Channel Impedance Value: 456 Ohm
Lead Channel Impedance Value: 513 Ohm
Lead Channel Impedance Value: 532 Ohm
Lead Channel Impedance Value: 589 Ohm
Lead Channel Impedance Value: 589 Ohm
Lead Channel Impedance Value: 760 Ohm
Lead Channel Impedance Value: 760 Ohm
Lead Channel Impedance Value: 817 Ohm
Lead Channel Pacing Threshold Amplitude: 0.5 V
Lead Channel Pacing Threshold Amplitude: 0.625 V
Lead Channel Pacing Threshold Amplitude: 1.5 V
Lead Channel Pacing Threshold Pulse Width: 0.4 ms
Lead Channel Pacing Threshold Pulse Width: 0.4 ms
Lead Channel Pacing Threshold Pulse Width: 0.8 ms
Lead Channel Sensing Intrinsic Amplitude: 1.125 mV
Lead Channel Sensing Intrinsic Amplitude: 1.125 mV
Lead Channel Sensing Intrinsic Amplitude: 2.625 mV
Lead Channel Sensing Intrinsic Amplitude: 2.625 mV
Lead Channel Setting Pacing Amplitude: 1.5 V
Lead Channel Setting Pacing Amplitude: 2 V
Lead Channel Setting Pacing Amplitude: 2.5 V
Lead Channel Setting Pacing Pulse Width: 0.4 ms
Lead Channel Setting Pacing Pulse Width: 0.8 ms
Lead Channel Setting Sensing Sensitivity: 0.3 mV
Zone Setting Status: 755011
Zone Setting Status: 755011

## 2022-08-10 ENCOUNTER — Ambulatory Visit: Payer: Commercial Managed Care - HMO | Attending: Internal Medicine

## 2022-08-10 DIAGNOSIS — I5022 Chronic systolic (congestive) heart failure: Secondary | ICD-10-CM

## 2022-08-10 DIAGNOSIS — Z95810 Presence of automatic (implantable) cardiac defibrillator: Secondary | ICD-10-CM

## 2022-08-12 NOTE — Progress Notes (Signed)
EPIC Encounter for ICM Monitoring  Patient Name: Joanne Bell is a 62 y.o. female Date: 08/12/2022 Primary Care Physican: Nelwyn Salisbury, MD Primary Cardiologist: Graciela Husbands Electrophysiologist: Joycelyn Schmid Pacing:  98.7%    07/10/2021 Office weight: 152 lbs            Battery Longevity: 8 months                                                 Transmission reviewed.    Optivol thoracic impedance suggesting normal fluid levels.     Prescribed: No diuretic   Recommendations:  No changes   Follow-up plan: ICM clinic phone appointment on 09/14/2022.  91 day device clinic remote transmission 10/26/2022.     EP/Cardiology Office Visits: 09/17/2022 with Dr Graciela Husbands.     Copy of ICM check sent to Dr. Graciela Husbands.    3 month ICM trend: 08/10/2022.    12-14 Month ICM trend:     Karie Soda, RN 08/12/2022 4:14 PM

## 2022-08-24 NOTE — Progress Notes (Signed)
Remote ICD transmission.   

## 2022-09-04 ENCOUNTER — Other Ambulatory Visit: Payer: Self-pay | Admitting: Internal Medicine

## 2022-09-14 ENCOUNTER — Ambulatory Visit: Payer: Commercial Managed Care - HMO | Attending: Internal Medicine

## 2022-09-14 DIAGNOSIS — I5022 Chronic systolic (congestive) heart failure: Secondary | ICD-10-CM

## 2022-09-14 DIAGNOSIS — Z95810 Presence of automatic (implantable) cardiac defibrillator: Secondary | ICD-10-CM

## 2022-09-14 NOTE — Progress Notes (Unsigned)
EPIC Encounter for ICM Monitoring  Patient Name: Joanne Bell is a 62 y.o. female Date: 09/14/2022 Primary Care Physican: Nelwyn Salisbury, MD Primary Cardiologist: Graciela Husbands Electrophysiologist: Joycelyn Schmid Pacing:  98.8%    07/10/2021 Office weight: 152 lbs            Battery Longevity: 5 months                                                 Attempted call to patient and unable to reach.  Left detailed message per DPR regarding transmission. Transmission reviewed.    Optivol thoracic impedance suggesting normal fluid levels.     Prescribed: No diuretic   Recommendations:  Left voice mail with ICM number and encouraged to call if experiencing any fluid symptoms.   Follow-up plan: ICM clinic phone appointment on 10/19/2022.  91 day device clinic remote transmission 10/26/2022.     EP/Cardiology Office Visits: 09/17/2022 with Dr Graciela Husbands.     Copy of ICM check sent to Dr. Graciela Husbands.    3 month ICM trend: 09/14/2022.    12-14 Month ICM trend:     Karie Soda, RN 09/14/2022 11:24 AM

## 2022-09-16 ENCOUNTER — Telehealth: Payer: Self-pay

## 2022-09-16 NOTE — Telephone Encounter (Signed)
Remote ICM transmission received.  Attempted call to patient regarding ICM remote transmission and left detailed message per DPR.  Left ICM phone number and advised to return call for any fluid symptoms or questions. Next ICM remote transmission scheduled 10/19/2022.

## 2022-09-17 ENCOUNTER — Ambulatory Visit: Payer: Commercial Managed Care - HMO | Attending: Internal Medicine | Admitting: Internal Medicine

## 2022-09-17 ENCOUNTER — Encounter: Payer: Self-pay | Admitting: Internal Medicine

## 2022-09-17 VITALS — BP 150/86 | HR 50 | Ht 64.5 in | Wt 158.2 lb

## 2022-09-17 DIAGNOSIS — I442 Atrioventricular block, complete: Secondary | ICD-10-CM | POA: Diagnosis not present

## 2022-09-17 DIAGNOSIS — I428 Other cardiomyopathies: Secondary | ICD-10-CM | POA: Diagnosis not present

## 2022-09-17 DIAGNOSIS — I5022 Chronic systolic (congestive) heart failure: Secondary | ICD-10-CM

## 2022-09-17 MED ORDER — SPIRONOLACTONE 25 MG PO TABS: 25.0000 mg | ORAL_TABLET | Freq: Every day | ORAL | 3 refills | Status: DC

## 2022-09-17 NOTE — Progress Notes (Signed)
Electrophysiology Office Note   Date:  09/17/2022   ID:  Joanne, Bell Aug 27, 1960, MRN [id number]  PCP:  Nelwyn Salisbury, MD  Cardiologist:    Primary Electrophysiologist: sk   No chief complaint on file.    History of Present Illness: Joanne Bell is a 62 y.o. female seen in followup for a pacemaker implanted December 2012 for complete heart block and with ongoing pacing, >> cardiomyopathy and underwent CRT-D upgrade 2017 with subsequent reversal of left ventricular dysfunction  Device approaching ERI  Her workup for underlying causes was negative including  MRI  echo Lyme titers and chest x-ray these were all normal. EF was normal by MRI 12/12  Sleep evaluation 2017 was borderline for an indication for CPAP.  Already yet Mr. Almond Mrs. Green Ms. McClain   The patient denies chest pain, shortness of breath, nocturnal dyspnea, orthopnea or peripheral edema.  There have been no palpitations, lightheadedness or syncope.  BP has been elevated.   Date Cr K Hgb  5/17 0.94 3.7 11.5   9/19 0.84 4.3 11.0  10/20 0.96 4.3   2/23 1.00 3.9 11.8  4/24 0.82 4.0 11.6     DATE TEST EF   12/12 cMRI 68%   1/15 Echo   45 %   11/17 Echo   30-35 %   2/18 Echo  45-50%   9/19.  Echo  55%              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/20/2015   Chronic systolic CHF (congestive heart failure), NYHA class 3 (HCC)    Colon polyps    Fibroids, intramural    Hypertension    NICM (nonischemic cardiomyopathy) (HCC) 07/20/2015   Past Surgical History:  Procedure Laterality Date   COLONOSCOPY  05/24/2019   per Dr. Ewing Schlein, adenomatous polyps, repeat in 5 yrs   ENDOMETRIAL ABLATION  03/09/2002   EP IMPLANTABLE DEVICE N/A 07/19/2015   Procedure: Upgrade to BIV ICD ;  Surgeon: Duke Salvia, MD;  Location: [facility]  INVASIVE CV LAB;  Service: Cardiovascular;  Laterality: N/A;   MYOMECTOMY  03/09/1996   per Dr. Cherly Hensen   PERMANENT PACEMAKER INSERTION Left 02/16/2011   Procedure: PERMANENT PACEMAKER INSERTION;  Surgeon: Duke Salvia, MD;  Location: [facility] CATH LAB;  Service: Cardiovascular;  Laterality: Left;     Current Outpatient Medications  Medication Sig Dispense Refill   carvedilol (COREG) 25 MG tablet TAKE 1 TABLET (25 MG TOTAL) BY MOUTH TWICE A DAY WITH MEALS 60 tablet 0   omeprazole (PRILOSEC) 40 MG capsule TAKE 1 CAPSULE (40 MG TOTAL) BY MOUTH DAILY. 90 capsule 0   sacubitril-valsartan (ENTRESTO) 49-51 MG Take 1 tablet by mouth 2 (two) times daily. 180 tablet 3   vitamin E 100 UNIT capsule Take 100 Units by mouth daily.     No current facility-administered medications for this visit.    Allergies:   Prednisone, Acyclovir and related, Augmentin [amoxicillin-pot clavulanate], Clonidine derivatives, and Percocet [oxycodone-acetaminophen]    ROS:  Please see the history of present illness.   Otherwise, review of systems is positive for noen.   All other systems are reviewed and negative.    PHYSICAL EXAM: VS:  BP (!) 150/86   Pulse (!) 50   Ht 5' 4.5" (1.638 m)   Wt 158 lb 3.2 oz (  71.8 kg)   SpO2 98%   BMI 26.74 kg/m  , BMI Body mass index is 26.74 kg/m. Well developed and well nourished in no acute distress HENT normal Neck supple with JVP-flat Clear Device pocket well healed; without hematoma or erythema.  There is no tethering  Regular rate and rhythm, no murmur Abd-soft with active BS No Clubbing cyanosis   edema Skin-warm and dry A & Oriented  Grossly normal sensory and motor function  ECG AV pacing with a negative QRS lead I and upright QRS lead V1  Device function is normal.approaching eri Programming changes  none  See Paceart for details     ASSESSMENT AND PLAN:  Complete heart block  Hypertension  Nonischemic cardiomyopathy-improved   CRT-D-Medtronic      Patient's blood pressure is poorly controlled.  This was apparently my doing by decreasing her Entresto; we will initiate with her resolved left ventricular function spironolactone to 25 mg.  I reviewed side effects we will get a metabolic profile in 2 weeks time.  With her recovered left ventricular function will continue the Entresto and the carvedilol.  She might be a candidate for an SGLT2.  Device is approaching ERI.  Alert was demonstrated.   Signed, Sherryl Manges, MD  09/17/2022 3:38 PM      [facility] [address] [place] [address] [phone] (office) [phone] (fax)

## 2022-09-17 NOTE — Patient Instructions (Signed)
Medication Instructions:  START Spironolactone 25 mg once daily  *If you need a refill on your cardiac medications before your next appointment, please call your pharmacy*   Lab Work: BMET in 2 weeks  If you have labs (blood work) drawn today and your tests are completely normal, you will receive your results only by: MyChart Message (if you have MyChart) OR A paper copy in the mail If you have any lab test that is abnormal or we need to change your treatment, we will call you to review the results.   Testing/Procedures: Echocardiogram Your physician has requested that you have an echocardiogram. Echocardiography is a painless test that uses sound waves to create images of your heart. It provides your doctor with information about the size and shape of your heart and how well your heart's chambers and valves are working. This procedure takes approximately one hour. There are no restrictions for this procedure. Please do NOT wear cologne, perfume, aftershave, or lotions (deodorant is allowed). Please arrive 15 minutes prior to your appointment time.   Follow-Up: At [facility], you and your health needs are our priority.  As part of our continuing mission to provide you with exceptional heart care, we have created designated Provider Care Teams.  These Care Teams include your primary Cardiologist (physician) and Advanced Practice Providers (APPs -  Physician Assistants and Nurse Practitioners) who all work together to provide you with the care you need, when you need it.  We recommend signing up for the patient portal called "MyChart".  Sign up information is provided on this After Visit Summary.  MyChart is used to connect with patients for Virtual Visits (Telemedicine).  Patients are able to view lab/test results, encounter notes, upcoming appointments, etc.  Non-urgent messages can be sent to your provider as well.   To learn more about what you can do with MyChart, go to  [email or web address].    Your next appointment:   9 month(s)  Provider:   Sherryl Manges, MD

## 2022-10-02 ENCOUNTER — Ambulatory Visit: Payer: Commercial Managed Care - HMO

## 2022-10-05 ENCOUNTER — Ambulatory Visit: Payer: Commercial Managed Care - HMO | Attending: Internal Medicine

## 2022-10-05 DIAGNOSIS — I428 Other cardiomyopathies: Secondary | ICD-10-CM

## 2022-10-05 DIAGNOSIS — I442 Atrioventricular block, complete: Secondary | ICD-10-CM

## 2022-10-05 DIAGNOSIS — I5022 Chronic systolic (congestive) heart failure: Secondary | ICD-10-CM

## 2022-10-09 ENCOUNTER — Other Ambulatory Visit: Payer: Self-pay | Admitting: Internal Medicine

## 2022-10-12 ENCOUNTER — Encounter: Payer: Self-pay | Admitting: Internal Medicine

## 2022-10-14 ENCOUNTER — Ambulatory Visit (HOSPITAL_COMMUNITY): Payer: Commercial Managed Care - HMO | Attending: Internal Medicine

## 2022-10-14 DIAGNOSIS — I428 Other cardiomyopathies: Secondary | ICD-10-CM | POA: Diagnosis not present

## 2022-10-14 DIAGNOSIS — I5022 Chronic systolic (congestive) heart failure: Secondary | ICD-10-CM | POA: Diagnosis not present

## 2022-10-14 DIAGNOSIS — I442 Atrioventricular block, complete: Secondary | ICD-10-CM | POA: Diagnosis not present

## 2022-10-14 LAB — ECHOCARDIOGRAM COMPLETE
Area-P 1/2: 3.12 cm2
S' Lateral: 2.9 cm

## 2022-10-19 ENCOUNTER — Ambulatory Visit: Payer: Commercial Managed Care - HMO | Attending: Internal Medicine

## 2022-10-19 DIAGNOSIS — Z95810 Presence of automatic (implantable) cardiac defibrillator: Secondary | ICD-10-CM | POA: Diagnosis not present

## 2022-10-19 DIAGNOSIS — I5022 Chronic systolic (congestive) heart failure: Secondary | ICD-10-CM | POA: Diagnosis not present

## 2022-10-20 NOTE — Progress Notes (Signed)
EPIC Encounter for ICM Monitoring  Patient Name: Joanne Bell is a 62 y.o. female Date: 10/20/2022 Primary Care Physican: Nelwyn Salisbury, MD Primary Cardiologist: Graciela Husbands Electrophysiologist: Joycelyn Schmid Pacing:  92.5%    09/16/2021 Office weight: 158 lbs            Battery Longevity: 5 months                                                 Transmission reviewed.    Optivol thoracic impedance suggesting normal fluid levels.     Prescribed:  Spironolactone 25 mg take 1 tablet by mouth daily   Recommendations:  No changes.   Follow-up plan: ICM clinic phone appointment on 11/23/2022.  91 day device clinic remote transmission 01/25/2023.     EP/Cardiology Office Visits:  Recall 06/14/2023 with Dr Graciela Husbands.     Copy of ICM check sent to Dr. Graciela Husbands.    3 month ICM trend: 10/19/2022.    12-14 Month ICM trend:     Karie Soda, RN 10/20/2022 2:45 PM

## 2022-10-26 ENCOUNTER — Ambulatory Visit (INDEPENDENT_AMBULATORY_CARE_PROVIDER_SITE_OTHER): Payer: Commercial Managed Care - HMO

## 2022-10-26 DIAGNOSIS — I442 Atrioventricular block, complete: Secondary | ICD-10-CM | POA: Diagnosis not present

## 2022-10-27 LAB — CUP PACEART REMOTE DEVICE CHECK
Battery Remaining Longevity: 5 mo
Battery Voltage: 2.81 V
Brady Statistic AP VP Percent: 9.01 %
Brady Statistic AP VS Percent: 0.01 %
Brady Statistic AS VP Percent: 90.97 %
Brady Statistic AS VS Percent: 0.01 %
Brady Statistic RA Percent Paced: 8.91 %
Brady Statistic RV Percent Paced: 92.5 %
Date Time Interrogation Session: 20240819012303
HighPow Impedance: 56 Ohm
Implantable Lead Connection Status: 753985
Implantable Lead Connection Status: 753985
Implantable Lead Connection Status: 753985
Implantable Lead Implant Date: 20121210
Implantable Lead Implant Date: 20170512
Implantable Lead Implant Date: 20170512
Implantable Lead Location: 753858
Implantable Lead Location: 753859
Implantable Lead Location: 753860
Implantable Lead Model: 4598
Implantable Pulse Generator Implant Date: 20170512
Lead Channel Impedance Value: 342 Ohm
Lead Channel Impedance Value: 361 Ohm
Lead Channel Impedance Value: 399 Ohm
Lead Channel Impedance Value: 418 Ohm
Lead Channel Impedance Value: 456 Ohm
Lead Channel Impedance Value: 456 Ohm
Lead Channel Impedance Value: 532 Ohm
Lead Channel Impedance Value: 608 Ohm
Lead Channel Impedance Value: 608 Ohm
Lead Channel Impedance Value: 665 Ohm
Lead Channel Impedance Value: 817 Ohm
Lead Channel Impedance Value: 836 Ohm
Lead Channel Impedance Value: 874 Ohm
Lead Channel Pacing Threshold Amplitude: 0.5 V
Lead Channel Pacing Threshold Amplitude: 0.625 V
Lead Channel Pacing Threshold Amplitude: 1.875 V
Lead Channel Pacing Threshold Pulse Width: 0.4 ms
Lead Channel Pacing Threshold Pulse Width: 0.4 ms
Lead Channel Pacing Threshold Pulse Width: 0.8 ms
Lead Channel Sensing Intrinsic Amplitude: 1.125 mV
Lead Channel Sensing Intrinsic Amplitude: 1.125 mV
Lead Channel Sensing Intrinsic Amplitude: 2.125 mV
Lead Channel Sensing Intrinsic Amplitude: 2.125 mV
Lead Channel Setting Pacing Amplitude: 1.5 V
Lead Channel Setting Pacing Amplitude: 2 V
Lead Channel Setting Pacing Amplitude: 3 V
Lead Channel Setting Pacing Pulse Width: 0.4 ms
Lead Channel Setting Pacing Pulse Width: 0.8 ms
Lead Channel Setting Sensing Sensitivity: 0.3 mV
Zone Setting Status: 755011
Zone Setting Status: 755011

## 2022-11-05 NOTE — Progress Notes (Signed)
Remote ICD transmission.   

## 2022-11-23 ENCOUNTER — Ambulatory Visit: Payer: Commercial Managed Care - HMO | Attending: Internal Medicine

## 2022-11-23 DIAGNOSIS — Z95810 Presence of automatic (implantable) cardiac defibrillator: Secondary | ICD-10-CM | POA: Diagnosis not present

## 2022-11-23 DIAGNOSIS — I5022 Chronic systolic (congestive) heart failure: Secondary | ICD-10-CM

## 2022-11-26 NOTE — Progress Notes (Signed)
EPIC Encounter for ICM Monitoring  Patient Name: Joanne Bell is a 62 y.o. female Date: 11/26/2022 Primary Care Physican: Nelwyn Salisbury, MD Primary Cardiologist: Graciela Husbands Electrophysiologist: Joycelyn Schmid Pacing:  97.3%    09/16/2021 Office weight: 158 lbs            Battery Longevity: 3 months                                                 Transmission reviewed.    Optivol thoracic impedance suggesting intermittent days with possible fluid accumulation within the last month.     Prescribed:  Spironolactone 25 mg take 1 tablet by mouth daily   Recommendations:  No changes.   Follow-up plan: ICM clinic phone appointment on 12/28/2022.  91 day device clinic remote transmission 01/25/2023.     EP/Cardiology Office Visits:  Recall 06/14/2023 with Dr Graciela Husbands.     Copy of ICM check sent to Dr. Graciela Husbands.    3 month ICM trend: 11/23/2022.    12-14 Month ICM trend:     Karie Soda, RN 11/26/2022 10:16 AM

## 2022-12-03 ENCOUNTER — Encounter: Payer: Self-pay | Admitting: Family Medicine

## 2022-12-03 ENCOUNTER — Ambulatory Visit: Payer: Commercial Managed Care - HMO | Admitting: Family Medicine

## 2022-12-03 VITALS — BP 134/82 | HR 78 | Temp 99.9°F | Wt 160.4 lb

## 2022-12-03 DIAGNOSIS — J020 Streptococcal pharyngitis: Secondary | ICD-10-CM

## 2022-12-03 DIAGNOSIS — J029 Acute pharyngitis, unspecified: Secondary | ICD-10-CM | POA: Diagnosis not present

## 2022-12-03 DIAGNOSIS — R0989 Other specified symptoms and signs involving the circulatory and respiratory systems: Secondary | ICD-10-CM | POA: Diagnosis not present

## 2022-12-03 LAB — POC COVID19 BINAXNOW: SARS Coronavirus 2 Ag: NEGATIVE

## 2022-12-03 LAB — POCT RAPID STREP A (OFFICE): Rapid Strep A Screen: POSITIVE — AB

## 2022-12-03 MED ORDER — HYDROCODONE BIT-HOMATROP MBR 5-1.5 MG/5ML PO SOLN: 5.0000 mL | ORAL | 0 refills | Status: DC | PRN

## 2022-12-03 MED ORDER — CEFUROXIME AXETIL 500 MG PO TABS: 500.0000 mg | ORAL_TABLET | Freq: Two times a day (BID) | ORAL | 0 refills | Status: AC

## 2022-12-03 NOTE — Progress Notes (Signed)
Subjective:    Patient ID: Baltazar Najjar, female    DOB: 03-Mar-1961, 62 y.o.   MRN: [id number]  HPI Here for 5 days of ST, fever, body aches, and a dry cough. Using Mucinex.    Review of Systems  Constitutional:  Positive for fever.  HENT:  Positive for congestion, postnasal drip and sore throat. Negative for sinus pressure.   Eyes: Negative.   Respiratory:  Positive for cough. Negative for shortness of breath and wheezing.        Objective:   Physical Exam Constitutional:      Appearance: Normal appearance.  HENT:     Right Ear: Tympanic membrane, ear canal and external ear normal.     Left Ear: Tympanic membrane, ear canal and external ear normal.     Nose: Nose normal.     Mouth/Throat:     Pharynx: Posterior oropharyngeal erythema present. No oropharyngeal exudate.  Eyes:     Conjunctiva/sclera: Conjunctivae normal.  Pulmonary:     Effort: Pulmonary effort is normal.     Breath sounds: Normal breath sounds.  Lymphadenopathy:     Cervical: No cervical adenopathy.  Neurological:     Mental Status: She is alert.           Assessment & Plan:  Strep pharyngitis, treat with 10 days of Cefuroxime.  Gershon Crane, MD

## 2022-12-28 ENCOUNTER — Ambulatory Visit: Payer: Commercial Managed Care - HMO | Attending: Internal Medicine

## 2022-12-28 ENCOUNTER — Telehealth: Payer: Self-pay

## 2022-12-28 DIAGNOSIS — I5022 Chronic systolic (congestive) heart failure: Secondary | ICD-10-CM | POA: Diagnosis not present

## 2022-12-28 DIAGNOSIS — Z95810 Presence of automatic (implantable) cardiac defibrillator: Secondary | ICD-10-CM | POA: Diagnosis not present

## 2022-12-28 NOTE — Progress Notes (Signed)
EPIC Encounter for ICM Monitoring  Patient Name: Joanne Bell is a 62 y.o. female Date: 12/28/2022 Primary Care Physican: Nelwyn Salisbury, MD Primary Cardiologist: Graciela Husbands Electrophysiologist: Joycelyn Schmid Pacing:  99.0%    09/16/2021 Office weight: 158 lbs            Battery Longevity: 3 months                                                 Attempted call to patient and unable to reach.  Left detailed message per DPR regarding transmission. Transmission reviewed.    Optivol thoracic impedance suggesting possible fluid accumulation starting 10/17.     Prescribed:  Spironolactone 25 mg take 1 tablet by mouth daily   Recommendations:Left voice mail with ICM number and encouraged to call if experiencing any fluid symptoms.   Follow-up plan: ICM clinic phone appointment on 01/11/2023 to recheck fluid levels.  91 day device clinic remote transmission 01/25/2023.     EP/Cardiology Office Visits:  Recall 06/14/2023 with Dr Graciela Husbands.     Copy of ICM check sent to Dr. Graciela Husbands.    3 month ICM trend: 12/28/2022.    12-14 Month ICM trend:     Karie Soda, RN 12/28/2022 1:52 PM

## 2022-12-28 NOTE — Telephone Encounter (Signed)
Remote ICM transmission received.  Attempted call to patient regarding ICM remote transmission and left detailed message per DPR.  Left ICM phone number and advised to return call for any fluid symptoms or questions. Next ICM remote transmission scheduled 01/11/2023.

## 2023-01-07 ENCOUNTER — Other Ambulatory Visit: Payer: Self-pay

## 2023-01-07 MED ORDER — CARVEDILOL 25 MG PO TABS: 25.0000 mg | ORAL_TABLET | Freq: Two times a day (BID) | ORAL | 2 refills | Status: DC

## 2023-01-09 ENCOUNTER — Other Ambulatory Visit: Payer: Self-pay | Admitting: Family Medicine

## 2023-01-09 DIAGNOSIS — Z95810 Presence of automatic (implantable) cardiac defibrillator: Secondary | ICD-10-CM

## 2023-01-11 ENCOUNTER — Ambulatory Visit: Payer: Managed Care, Other (non HMO) | Attending: Internal Medicine

## 2023-01-11 DIAGNOSIS — I5022 Chronic systolic (congestive) heart failure: Secondary | ICD-10-CM

## 2023-01-11 DIAGNOSIS — Z95810 Presence of automatic (implantable) cardiac defibrillator: Secondary | ICD-10-CM

## 2023-01-12 ENCOUNTER — Other Ambulatory Visit: Payer: Self-pay

## 2023-01-12 MED ORDER — SPIRONOLACTONE 25 MG PO TABS: 25.0000 mg | ORAL_TABLET | Freq: Every day | ORAL | 2 refills | Status: DC

## 2023-01-12 NOTE — Progress Notes (Signed)
EPIC Encounter for ICM Monitoring  Patient Name: Joanne Bell is a 62 y.o. female Date: 01/12/2023 Primary Care Physican: Nelwyn Salisbury, MD Primary Cardiologist: Graciela Husbands Electrophysiologist: Joycelyn Schmid Pacing:  97.7%    12/03/2022 Weight: 160 lbs            Battery Longevity: 3 months                                                 Transmission reviewed.    Optivol thoracic impedance suggesting fluid levels returned normal.     Prescribed:  Spironolactone 25 mg take 1 tablet by mouth daily   Recommendations: No changes.   Follow-up plan: ICM clinic phone appointment on 02/08/2023.  91 day device clinic remote transmission 01/25/2023.     EP/Cardiology Office Visits:  Recall 06/14/2023 with Dr Graciela Husbands.     Copy of ICM check sent to Dr. Graciela Husbands.   3 month ICM trend: 01/11/2023.    12-14 Month ICM trend:     Karie Soda, RN 01/12/2023 1:40 PM

## 2023-01-20 ENCOUNTER — Other Ambulatory Visit: Payer: Self-pay | Admitting: Internal Medicine

## 2023-01-20 NOTE — Telephone Encounter (Signed)
*  STAT* If patient is at the pharmacy, call can be transferred to refill team.   1. Which medications need to be refilled? (please list name of each medication and dose if known)   sacubitril-valsartan (ENTRESTO) 49-51 MG     2. Would you like to learn more about the convenience, safety, & potential coNo   3. Are you open to using the [facility] No   4. Which pharmacy/location (including street and city if local pharmacy) is medication to be sent to?[facility]   5. Do they need a 30 day or 90 day supply ?90 Day Supply

## 2023-01-25 ENCOUNTER — Ambulatory Visit: Payer: BC Managed Care – PPO

## 2023-01-25 ENCOUNTER — Other Ambulatory Visit (HOSPITAL_COMMUNITY): Payer: Self-pay

## 2023-01-25 MED ORDER — SACUBITRIL-VALSARTAN 49-51 MG PO TABS: 1.0000 | ORAL_TABLET | Freq: Two times a day (BID) | ORAL | 2 refills | Status: DC

## 2023-01-25 MED ORDER — SACUBITRIL-VALSARTAN 49-51 MG PO TABS
1.0000 | ORAL_TABLET | Freq: Two times a day (BID) | ORAL | 2 refills | Status: DC
  Filled 2023-01-25: qty 180, 90d supply, fill #0

## 2023-01-25 NOTE — Addendum Note (Signed)
Addended by: Adriana Simas, Kinzey Sheriff L on: 01/25/2023 11:55 AM   Modules accepted: Orders

## 2023-01-27 ENCOUNTER — Ambulatory Visit: Payer: Managed Care, Other (non HMO)

## 2023-01-29 ENCOUNTER — Telehealth: Payer: Self-pay | Admitting: Internal Medicine

## 2023-01-29 MED ORDER — SACUBITRIL-VALSARTAN 49-51 MG PO TABS: 1.0000 | ORAL_TABLET | Freq: Two times a day (BID) | ORAL | 2 refills | Status: DC

## 2023-01-29 NOTE — Telephone Encounter (Signed)
Pt c/o medication issue:  1. Name of Medication:   sacubitril-valsartan (ENTRESTO) 49-51 MG   2. How are you currently taking this medication (dosage and times per day)?   As prescribed  3. Are you having a reaction (difficulty breathing--STAT)?   No  4. What is your medication issue?   Patient stated she gets her medication from [facility] and they notified her they will need a new prescription sent.  Patient stated she has 5 days left of this medication.

## 2023-02-01 ENCOUNTER — Encounter: Payer: Self-pay | Admitting: Family Medicine

## 2023-02-01 ENCOUNTER — Ambulatory Visit: Payer: Commercial Managed Care - HMO | Admitting: Family Medicine

## 2023-02-01 VITALS — BP 126/84 | HR 55 | Temp 97.7°F | Wt 158.2 lb

## 2023-02-01 DIAGNOSIS — J029 Acute pharyngitis, unspecified: Secondary | ICD-10-CM

## 2023-02-01 DIAGNOSIS — J019 Acute sinusitis, unspecified: Secondary | ICD-10-CM | POA: Diagnosis not present

## 2023-02-01 DIAGNOSIS — R059 Cough, unspecified: Secondary | ICD-10-CM

## 2023-02-01 LAB — POCT RAPID STREP A (OFFICE): Rapid Strep A Screen: NEGATIVE

## 2023-02-01 LAB — POC COVID19 BINAXNOW: SARS Coronavirus 2 Ag: NEGATIVE

## 2023-02-01 MED ORDER — AZITHROMYCIN 250 MG PO TABS: ORAL_TABLET | ORAL | 0 refills | Status: DC

## 2023-02-01 NOTE — Progress Notes (Signed)
   Subjective:    Patient ID: Joanne Bell, female    DOB: September 25, 1960, 62 y.o.   MRN: [id number]  HPI Here for 6 days of stuffy head, PND, and ST. No cough or fever.    Review of Systems  Constitutional: Negative.   HENT:  Positive for congestion, postnasal drip, sinus pressure and sore throat. Negative for ear pain.   Eyes: Negative.   Respiratory: Negative.         Objective:   Physical Exam Constitutional:      Appearance: Normal appearance.  HENT:     Right Ear: Tympanic membrane, ear canal and external ear normal.     Left Ear: Tympanic membrane, ear canal and external ear normal.     Nose: Nose normal.     Mouth/Throat:     Pharynx: Oropharynx is clear.  Eyes:     Conjunctiva/sclera: Conjunctivae normal.  Pulmonary:     Effort: Pulmonary effort is normal.     Breath sounds: Normal breath sounds.  Lymphadenopathy:     Cervical: No cervical adenopathy.  Neurological:     Mental Status: She is alert.           Assessment & Plan:  Sinusitis, treat with a Zpack.  Gershon Crane, MD

## 2023-02-01 NOTE — Addendum Note (Signed)
Addended by: Carola Rhine on: 02/01/2023 12:16 PM   Modules accepted: Orders

## 2023-02-11 ENCOUNTER — Telehealth: Payer: Self-pay | Admitting: Internal Medicine

## 2023-02-11 MED ORDER — SACUBITRIL-VALSARTAN 49-51 MG PO TABS: 1.0000 | ORAL_TABLET | Freq: Two times a day (BID) | ORAL | Status: DC

## 2023-02-11 NOTE — Telephone Encounter (Signed)
Left message for patient to call back  

## 2023-02-11 NOTE — Telephone Encounter (Signed)
Pt is returning call to nurse.  °

## 2023-02-11 NOTE — Telephone Encounter (Signed)
Called patient back and she needs sample to get her through until her mail delivery arrives next week. Will give patient one week of samples of entresto 49/51 mg

## 2023-02-11 NOTE — Telephone Encounter (Signed)
Patient calling the office for samples of medication:   1.  What medication and dosage are you requesting samples for?   sacubitril-valsartan (ENTRESTO) 49-51 MG    2.  Are you currently out of this medication?  Yes   

## 2023-02-15 NOTE — Progress Notes (Signed)
No ICM remote transmission received for 02/08/2023 and next ICM transmission scheduled for 03/15/2023.

## 2023-02-16 ENCOUNTER — Encounter: Payer: Self-pay | Admitting: Family Medicine

## 2023-02-16 ENCOUNTER — Encounter: Payer: Self-pay | Admitting: Internal Medicine

## 2023-02-16 ENCOUNTER — Ambulatory Visit (INDEPENDENT_AMBULATORY_CARE_PROVIDER_SITE_OTHER): Payer: Commercial Managed Care - HMO

## 2023-02-16 DIAGNOSIS — Z23 Encounter for immunization: Secondary | ICD-10-CM

## 2023-02-16 NOTE — Progress Notes (Signed)
No ICM remote transmission received for 02/08/2023 and next ICM transmission scheduled for 03/15/2023.

## 2023-02-19 ENCOUNTER — Telehealth: Payer: Self-pay

## 2023-02-19 NOTE — Telephone Encounter (Signed)
Patient states she is returning an additional call. She assumes it was from the device clinic.

## 2023-02-19 NOTE — Telephone Encounter (Signed)
Yes you both should get the newest version

## 2023-02-19 NOTE — Telephone Encounter (Signed)
Appears Dr. Claris Che office is trying to contact Pt.  Left a detailed message for Pt advising of this.

## 2023-02-19 NOTE — Telephone Encounter (Signed)
Call received from Pt.  She is reporting that her device alarmed this morning.  Alert received from [employer] advising of device RRT 02/19/2023.  Discussed findings with Pt.  Advised need to determine if Pt needs follow up visit with physician prior to gen change.  Per Pt Dr. Graciela Husbands discussed procedure with Pt at last office visit.  Advised Pt that her device would alarm daily.  Offered device clinic appt today to turn off, but she states she is not disturbed by alarm and will wait for either office visit or if no visit needed will turn off when she comes in for labs/soap.  Will forward to Dr. Graciela Husbands and scheduling in regards to follow up.

## 2023-02-22 NOTE — Telephone Encounter (Signed)
Patient is returning phone call.  °

## 2023-02-22 NOTE — Telephone Encounter (Signed)
Patient wanted device clinic apt to turn off alarm prior to 03/01/23 apt. DC apt made 02/24/23 @ 9:20 am.

## 2023-02-24 ENCOUNTER — Ambulatory Visit: Payer: Commercial Managed Care - HMO | Attending: Cardiology

## 2023-02-24 DIAGNOSIS — Z95810 Presence of automatic (implantable) cardiac defibrillator: Secondary | ICD-10-CM

## 2023-02-25 LAB — CUP PACEART INCLINIC DEVICE CHECK
Battery Remaining Longevity: 1 mo
Battery Voltage: 2.72 V
Brady Statistic AP VP Percent: 9.27 %
Brady Statistic AP VS Percent: 0.01 %
Brady Statistic AS VP Percent: 90.66 %
Brady Statistic AS VS Percent: 0.07 %
Brady Statistic RA Percent Paced: 9.22 %
Brady Statistic RV Percent Paced: 97.12 %
Date Time Interrogation Session: 20241218112000
HighPow Impedance: 55 Ohm
Implantable Lead Connection Status: 753985
Implantable Lead Connection Status: 753985
Implantable Lead Connection Status: 753985
Implantable Lead Implant Date: 20121210
Implantable Lead Implant Date: 20170512
Implantable Lead Implant Date: 20170512
Implantable Lead Location: 753858
Implantable Lead Location: 753859
Implantable Lead Location: 753860
Implantable Lead Model: 4598
Implantable Pulse Generator Implant Date: 20170512
Lead Channel Impedance Value: 361 Ohm
Lead Channel Impedance Value: 361 Ohm
Lead Channel Impedance Value: 361 Ohm
Lead Channel Impedance Value: 418 Ohm
Lead Channel Impedance Value: 456 Ohm
Lead Channel Impedance Value: 456 Ohm
Lead Channel Impedance Value: 532 Ohm
Lead Channel Impedance Value: 608 Ohm
Lead Channel Impedance Value: 608 Ohm
Lead Channel Impedance Value: 646 Ohm
Lead Channel Impedance Value: 836 Ohm
Lead Channel Impedance Value: 836 Ohm
Lead Channel Impedance Value: 874 Ohm
Lead Channel Pacing Threshold Amplitude: 0.375 V
Lead Channel Pacing Threshold Amplitude: 0.625 V
Lead Channel Pacing Threshold Amplitude: 1.625 V
Lead Channel Pacing Threshold Pulse Width: 0.4 ms
Lead Channel Pacing Threshold Pulse Width: 0.4 ms
Lead Channel Pacing Threshold Pulse Width: 0.8 ms
Lead Channel Sensing Intrinsic Amplitude: 1.125 mV
Lead Channel Sensing Intrinsic Amplitude: 1.125 mV
Lead Channel Sensing Intrinsic Amplitude: 2.125 mV
Lead Channel Sensing Intrinsic Amplitude: 2.125 mV
Lead Channel Setting Pacing Amplitude: 1.5 V
Lead Channel Setting Pacing Amplitude: 2 V
Lead Channel Setting Pacing Amplitude: 2.75 V
Lead Channel Setting Pacing Pulse Width: 0.4 ms
Lead Channel Setting Pacing Pulse Width: 0.8 ms
Lead Channel Setting Sensing Sensitivity: 0.3 mV
Zone Setting Status: 755011
Zone Setting Status: 755011

## 2023-02-25 NOTE — Progress Notes (Signed)
Programmed the patient alarm off but did keep the monitoring alert programmed on Patient in today due to daily alarm going off on her ICD due to battery reaching RRT on 02/19/23.   for our monitoring purposes.  Patient will not hear the alarm any further.    Device wrap up report uploaded to Epic through PACEART.  No other changes made to device today.   Patient is scheduled with Canary Brim, NP on 03/01/23 to discuss generator and schedule generator change out.

## 2023-02-28 NOTE — Progress Notes (Unsigned)
  Electrophysiology Office Note:   Date:  03/01/2023  ID:  Haifa Stigler, DOB 1960/11/09, MRN [id number]  Primary Cardiologist: None Primary Heart Failure: None Electrophysiologist: Sherryl Manges, MD      History of Present Illness:   Joanne Bell is a 62 y.o. female with h/o CHB s/p PPM with subsequent NICM and upgrade to CRT-D in 2017, HTN seen today for routine electrophysiology followup.   Seen in EP Clinic 09/2022 by Dr. Graciela Husbands, BP medications were adjusted. She was approaching ERI at that time.   Since last being seen in our clinic the patient reports she has been doing well.  Her device alerted and she came into clinic on 12/18 for re-programming.  She met RRT on 02/19/23.    She denies chest pain, palpitations, dyspnea, PND, orthopnea, nausea, vomiting, dizziness, syncope, edema, weight gain, or early satiety.   Review of systems complete and found to be negative unless listed in HPI.   EP Information / Studies Reviewed:    EKG is ordered today. Personal review as below.  EKG Interpretation Date/Time:  Monday March 01 2023 08:13:20 EST Ventricular Rate:  50 PR Interval:  166 QRS Duration:  140 QT Interval:  444 QTC Calculation: 404 R Axis:   244  Text Interpretation: AV dual-paced rhythm Biventricular pacemaker detected Confirmed by Canary Brim ([id number]) on 03/01/2023 8:18:46 AM   ICD Interrogation-  reviewed in detail today,  See PACEART report.  Device History: Medtronic BiV ICD implanted 07/19/15 for AVB (PPM > upgrade to CRT-D 2017)  History of appropriate therapy: No History of AAD therapy: No   Studies:  cMRI 02/2011 > LVEF 68% ECHO 01/2016 30-35%  Echo 11/2017 > LVEF 55%    Arrhythmia / AAD CHB > PPM to CRT-D            Physical Exam:   VS:  BP 122/80 (BP Location: Left Arm, Patient Position: Sitting, Cuff Size: Normal)   Pulse (!) 50   Resp 16   Ht 5\' 4"  (1.626 m)   Wt 156 lb (70.8 kg)   SpO2 96%   BMI 26.78 kg/m    Wt  Readings from Last 3 Encounters:  03/01/23 156 lb (70.8 kg)  02/01/23 158 lb 3.2 oz (71.8 kg)  12/03/22 160 lb 6.4 oz (72.8 kg)     GEN: Well nourished, well developed in no acute distress NECK: No JVD; No carotid bruits CARDIAC: Regular rate and rhythm, no murmurs, rubs, gallops RESPIRATORY:  Clear to auscultation without rales, wheezing or rhonchi  ABDOMEN: Soft, non-tender, non-distended EXTREMITIES:  No edema; No deformity   ASSESSMENT AND PLAN:    Chronic Systolic Dysfunction s/p Medtronic CRT-D  CHB  -euvolemic today / OptiVol stable -Stable on an appropriate medical regimen -Normal ICD function -See Pace Art report -no R waves at 35 bpm  -No changes today -pre-procedure labs > CBC, BMP   -continue Entresto, GDMT  -generator change reviewed with patient  > pt elected for 03/23/23    Disposition:   Follow up with Dr. Graciela Husbands as planned  for generator change on 03/23/23      Signed, Canary Brim, MSN, APRN, NP-C, AGACNP-BC [facility] - Electrophysiology  03/01/2023, 1:25 PM

## 2023-03-01 ENCOUNTER — Encounter: Payer: Self-pay | Admitting: *Deleted

## 2023-03-01 ENCOUNTER — Encounter: Payer: Self-pay | Admitting: Pulmonary Disease

## 2023-03-01 ENCOUNTER — Ambulatory Visit: Payer: Commercial Managed Care - HMO | Attending: Pulmonary Disease | Admitting: Pulmonary Disease

## 2023-03-01 VITALS — BP 122/80 | HR 50 | Resp 16 | Ht 64.0 in | Wt 156.0 lb

## 2023-03-01 DIAGNOSIS — I428 Other cardiomyopathies: Secondary | ICD-10-CM | POA: Diagnosis not present

## 2023-03-01 DIAGNOSIS — I442 Atrioventricular block, complete: Secondary | ICD-10-CM

## 2023-03-01 DIAGNOSIS — Z95810 Presence of automatic (implantable) cardiac defibrillator: Secondary | ICD-10-CM

## 2023-03-01 DIAGNOSIS — I5022 Chronic systolic (congestive) heart failure: Secondary | ICD-10-CM

## 2023-03-01 LAB — CUP PACEART INCLINIC DEVICE CHECK
Battery Remaining Longevity: 1 mo
Battery Voltage: 2.72 V
Brady Statistic AP VP Percent: 4.01 %
Brady Statistic AP VS Percent: 0.01 %
Brady Statistic AS VP Percent: 95.89 %
Brady Statistic AS VS Percent: 0.09 %
Brady Statistic RA Percent Paced: 4.01 %
Brady Statistic RV Percent Paced: 99.18 %
Date Time Interrogation Session: 20241223131952
HighPow Impedance: 59 Ohm
Implantable Lead Connection Status: 753985
Implantable Lead Connection Status: 753985
Implantable Lead Connection Status: 753985
Implantable Lead Implant Date: 20121210
Implantable Lead Implant Date: 20170512
Implantable Lead Implant Date: 20170512
Implantable Lead Location: 753858
Implantable Lead Location: 753859
Implantable Lead Location: 753860
Implantable Lead Model: 4598
Implantable Pulse Generator Implant Date: 20170512
Lead Channel Impedance Value: 361 Ohm
Lead Channel Impedance Value: 399 Ohm
Lead Channel Impedance Value: 399 Ohm
Lead Channel Impedance Value: 399 Ohm
Lead Channel Impedance Value: 418 Ohm
Lead Channel Impedance Value: 475 Ohm
Lead Channel Impedance Value: 532 Ohm
Lead Channel Impedance Value: 646 Ohm
Lead Channel Impedance Value: 646 Ohm
Lead Channel Impedance Value: 703 Ohm
Lead Channel Impedance Value: 836 Ohm
Lead Channel Impedance Value: 874 Ohm
Lead Channel Impedance Value: 950 Ohm
Lead Channel Pacing Threshold Amplitude: 0.375 V
Lead Channel Pacing Threshold Amplitude: 0.625 V
Lead Channel Pacing Threshold Amplitude: 1.75 V
Lead Channel Pacing Threshold Pulse Width: 0.4 ms
Lead Channel Pacing Threshold Pulse Width: 0.4 ms
Lead Channel Pacing Threshold Pulse Width: 0.8 ms
Lead Channel Sensing Intrinsic Amplitude: 1.125 mV
Lead Channel Sensing Intrinsic Amplitude: 1.125 mV
Lead Channel Sensing Intrinsic Amplitude: 2.625 mV
Lead Channel Sensing Intrinsic Amplitude: 3.125 mV
Lead Channel Setting Pacing Amplitude: 1.5 V
Lead Channel Setting Pacing Amplitude: 2 V
Lead Channel Setting Pacing Amplitude: 2.75 V
Lead Channel Setting Pacing Pulse Width: 0.4 ms
Lead Channel Setting Pacing Pulse Width: 0.8 ms
Lead Channel Setting Sensing Sensitivity: 0.3 mV
Zone Setting Status: 755011
Zone Setting Status: 755011

## 2023-03-01 NOTE — Patient Instructions (Signed)
Medication Instructions:  Your physician recommends that you continue on your current medications as directed. Please refer to the Current Medication list given to you today.  *If you need a refill on your cardiac medications before your next appointment, please call your pharmacy*  Lab Work: BMET, CBC-TODAY If you have labs (blood work) drawn today and your tests are completely normal, you will receive your results only by: MyChart Message (if you have MyChart) OR A paper copy in the mail If you have any lab test that is abnormal or we need to change your treatment, we will call you to review the results.  Testing/Procedures: See letter  Follow-Up: At [facility], you and your health needs are our priority.  As part of our continuing mission to provide you with exceptional heart care, we have created designated Provider Care Teams.  These Care Teams include your primary Cardiologist (physician) and Advanced Practice Providers (APPs -  Physician Assistants and Nurse Practitioners) who all work together to provide you with the care you need, when you need it.   Your next appointment:   Follow up appointments will be scheduled for you and print out on your discharge summary after your procedure.

## 2023-03-02 LAB — CBC
Hematocrit: 36.4 % (ref 34.0–46.6)
Hemoglobin: 11.1 g/dL (ref 11.1–15.9)
MCH: 26.1 pg — ABNORMAL LOW (ref 26.6–33.0)
MCHC: 30.5 g/dL — ABNORMAL LOW (ref 31.5–35.7)
MCV: 86 fL (ref 79–97)
Platelets: 271 10*3/uL (ref 150–450)
RBC: 4.25 x10E6/uL (ref 3.77–5.28)
RDW: 13.1 % (ref 11.7–15.4)
WBC: 6 10*3/uL (ref 3.4–10.8)

## 2023-03-02 LAB — BASIC METABOLIC PANEL
BUN/Creatinine Ratio: 16 (ref 12–28)
BUN: 14 mg/dL (ref 8–27)
CO2: 25 mmol/L (ref 20–29)
Calcium: 9.6 mg/dL (ref 8.7–10.3)
Chloride: 101 mmol/L (ref 96–106)
Creatinine, Ser: 0.9 mg/dL (ref 0.57–1.00)
Glucose: 97 mg/dL (ref 70–99)
Potassium: 4.7 mmol/L (ref 3.5–5.2)
Sodium: 141 mmol/L (ref 134–144)
eGFR: 71 mL/min/{1.73_m2} (ref >59)

## 2023-03-15 ENCOUNTER — Ambulatory Visit: Payer: Commercial Managed Care - HMO | Attending: Internal Medicine

## 2023-03-15 DIAGNOSIS — I5022 Chronic systolic (congestive) heart failure: Secondary | ICD-10-CM | POA: Diagnosis not present

## 2023-03-15 DIAGNOSIS — Z95810 Presence of automatic (implantable) cardiac defibrillator: Secondary | ICD-10-CM | POA: Diagnosis not present

## 2023-03-17 LAB — CUP PACEART REMOTE DEVICE CHECK
Battery Remaining Longevity: 1 mo
Battery Voltage: 2.72 V
Brady Statistic AP VP Percent: 5.61 %
Brady Statistic AP VS Percent: 0.01 %
Brady Statistic AS VP Percent: 94.25 %
Brady Statistic AS VS Percent: 0.13 %
Brady Statistic RA Percent Paced: 5.59 %
Brady Statistic RV Percent Paced: 97.43 %
Date Time Interrogation Session: 20250108001602
HighPow Impedance: 62 Ohm
Implantable Lead Connection Status: 753985
Implantable Lead Connection Status: 753985
Implantable Lead Connection Status: 753985
Implantable Lead Implant Date: 20121210
Implantable Lead Implant Date: 20170512
Implantable Lead Implant Date: 20170512
Implantable Lead Location: 753858
Implantable Lead Location: 753859
Implantable Lead Location: 753860
Implantable Lead Model: 4598
Implantable Pulse Generator Implant Date: 20170512
Lead Channel Impedance Value: 361 Ohm
Lead Channel Impedance Value: 361 Ohm
Lead Channel Impedance Value: 399 Ohm
Lead Channel Impedance Value: 399 Ohm
Lead Channel Impedance Value: 456 Ohm
Lead Channel Impedance Value: 475 Ohm
Lead Channel Impedance Value: 532 Ohm
Lead Channel Impedance Value: 589 Ohm
Lead Channel Impedance Value: 608 Ohm
Lead Channel Impedance Value: 608 Ohm
Lead Channel Impedance Value: 836 Ohm
Lead Channel Impedance Value: 836 Ohm
Lead Channel Impedance Value: 874 Ohm
Lead Channel Pacing Threshold Amplitude: 0.375 V
Lead Channel Pacing Threshold Amplitude: 0.625 V
Lead Channel Pacing Threshold Amplitude: 2.125 V
Lead Channel Pacing Threshold Pulse Width: 0.4 ms
Lead Channel Pacing Threshold Pulse Width: 0.4 ms
Lead Channel Pacing Threshold Pulse Width: 0.8 ms
Lead Channel Sensing Intrinsic Amplitude: 1.125 mV
Lead Channel Sensing Intrinsic Amplitude: 1.125 mV
Lead Channel Sensing Intrinsic Amplitude: 3.5 mV
Lead Channel Sensing Intrinsic Amplitude: 3.5 mV
Lead Channel Setting Pacing Amplitude: 1.5 V
Lead Channel Setting Pacing Amplitude: 2 V
Lead Channel Setting Pacing Amplitude: 3.25 V
Lead Channel Setting Pacing Pulse Width: 0.4 ms
Lead Channel Setting Pacing Pulse Width: 0.8 ms
Lead Channel Setting Sensing Sensitivity: 0.3 mV
Zone Setting Status: 755011
Zone Setting Status: 755011

## 2023-03-18 NOTE — Progress Notes (Signed)
 EPIC Encounter for ICM Monitoring  Patient Name: Joanne Bell is a 63 y.o. female Date: 03/18/2023 Primary Care Physican: Johnny Garnette LABOR, MD Primary Cardiologist: Fernande Electrophysiologist: Fernande Pore Pacing:  97.4%    12/03/2022 Weight: 160 lbs            Battery Longevity: Replacement scheduled 03/23/2023                                               Transmission reviewed.    Optivol thoracic impedance suggesting normal fluid levels.     Prescribed:  Spironolactone  25 mg take 1 tablet by mouth daily   Recommendations: No changes.   Follow-up plan: ICM clinic phone appointment on 05/10/2023 after battery replacement.  91 day device clinic remote transmission pending battery replacement.     EP/Cardiology Office Visits:  None.     Copy of ICM check sent to Dr. Fernande.   3 month ICM trend: 03/17/2023.    12-14 Month ICM trend:     Joanne GORMAN Garner, RN 03/18/2023 3:16 PM

## 2023-03-22 ENCOUNTER — Encounter: Payer: Self-pay | Admitting: Family Medicine

## 2023-03-22 ENCOUNTER — Ambulatory Visit: Payer: Commercial Managed Care - HMO | Admitting: Family Medicine

## 2023-03-22 VITALS — BP 130/80 | HR 52 | Temp 98.2°F | Wt 152.8 lb

## 2023-03-22 DIAGNOSIS — A084 Viral intestinal infection, unspecified: Secondary | ICD-10-CM | POA: Diagnosis not present

## 2023-03-22 LAB — CBC WITH DIFFERENTIAL/PLATELET
Basophils Absolute: 0 10*3/uL (ref 0.0–0.1)
Basophils Relative: 0.5 % (ref 0.0–3.0)
Eosinophils Absolute: 0.1 10*3/uL (ref 0.0–0.7)
Eosinophils Relative: 1.1 % (ref 0.0–5.0)
HCT: 36 % (ref 36.0–46.0)
Hemoglobin: 11.4 g/dL — ABNORMAL LOW (ref 12.0–15.0)
Lymphocytes Relative: 42.9 % (ref 12.0–46.0)
Lymphs Abs: 2.2 10*3/uL (ref 0.7–4.0)
MCHC: 31.7 g/dL (ref 30.0–36.0)
MCV: 85.3 fL (ref 78.0–100.0)
Monocytes Absolute: 0.4 10*3/uL (ref 0.1–1.0)
Monocytes Relative: 7 % (ref 3.0–12.0)
Neutro Abs: 2.5 10*3/uL (ref 1.4–7.7)
Neutrophils Relative %: 48.5 % (ref 43.0–77.0)
Platelets: 260 10*3/uL (ref 150.0–400.0)
RBC: 4.22 Mil/uL (ref 3.87–5.11)
RDW: 13.8 % (ref 11.5–15.5)
WBC: 5.1 10*3/uL (ref 4.0–10.5)

## 2023-03-22 LAB — BASIC METABOLIC PANEL
BUN: 11 mg/dL (ref 6–23)
CO2: 31 meq/L (ref 19–32)
Calcium: 9.4 mg/dL (ref 8.4–10.5)
Chloride: 104 meq/L (ref 96–112)
Creatinine, Ser: 0.93 mg/dL (ref 0.40–1.20)
GFR: 64.88 mL/min (ref >60.00)
Glucose, Bld: 100 mg/dL — ABNORMAL HIGH (ref 70–99)
Potassium: 3.7 meq/L (ref 3.5–5.1)
Sodium: 142 meq/L (ref 135–145)

## 2023-03-22 NOTE — Pre-Procedure Instructions (Signed)
 Attempted to call patient regarding procedure.  Left voicemail on the following  items: Arrival time 0900 Nothing to eat or drink after midnight No meds AM of procedure Responsible person to drive you home and stay with you for 24 hrs Wash with special soap night before and morning of procedure

## 2023-03-22 NOTE — Progress Notes (Signed)
   Subjective:    Patient ID: Joanne Bell, female    DOB: 09-09-1960, 63 y.o.   MRN: [id number]  HPI Here to see if she is healthy enough to have her ICD replaced tomorrow as scheduled. About one week ago she developed abdominal cramps, nausea with vomiting, and diarrhea. No fever. For about 5 days she took Imodium and could only sip fluids. Then yesterday she felt a lot better, and today she feels totally back to normal. She is eating and drinking normally. She passed a normal solid stool yesterday.    Review of Systems  Constitutional: Negative.   Respiratory: Negative.    Cardiovascular: Negative.   Gastrointestinal:  Positive for abdominal pain, diarrhea, nausea and vomiting. Negative for abdominal distention, blood in stool and constipation.  Genitourinary: Negative.        Objective:   Physical Exam Constitutional:      Appearance: Normal appearance. She is not ill-appearing.  Cardiovascular:     Rate and Rhythm: Normal rate and regular rhythm.     Pulses: Normal pulses.     Heart sounds: Normal heart sounds.  Pulmonary:     Effort: Pulmonary effort is normal.     Breath sounds: Normal breath sounds.  Abdominal:     General: Bowel sounds are normal. There is no distension.     Palpations: Abdomen is soft. There is no mass.     Tenderness: There is no abdominal tenderness. There is no right CVA tenderness, left CVA tenderness, guarding or rebound.     Hernia: No hernia is present.  Neurological:     Mental Status: She is alert.           Assessment & Plan:  She has recovered from a viral enteritis, likely due to Norovirus. We will get a CBC and BMET today. Assuming these labs come back okay, she will be cleared to have her procedure tomorrow.  Garnette Olmsted, MD

## 2023-03-23 ENCOUNTER — Encounter (HOSPITAL_COMMUNITY)
Admission: RE | Disposition: A | Discharge status: Home / Self Care | Payer: Self-pay | Source: Home / Self Care | Attending: Internal Medicine

## 2023-03-23 ENCOUNTER — Ambulatory Visit (HOSPITAL_COMMUNITY)
Admission: RE | Admit: 2023-03-23 | Discharge: 2023-03-23 | Disposition: A | Discharge status: Home / Self Care | Payer: Commercial Managed Care - HMO | Attending: Internal Medicine | Admitting: Internal Medicine

## 2023-03-23 ENCOUNTER — Other Ambulatory Visit: Payer: Self-pay

## 2023-03-23 DIAGNOSIS — I110 Hypertensive heart disease with heart failure: Secondary | ICD-10-CM | POA: Insufficient documentation

## 2023-03-23 DIAGNOSIS — Z4502 Encounter for adjustment and management of automatic implantable cardiac defibrillator: Secondary | ICD-10-CM | POA: Diagnosis present

## 2023-03-23 DIAGNOSIS — Z79899 Other long term (current) drug therapy: Secondary | ICD-10-CM | POA: Insufficient documentation

## 2023-03-23 DIAGNOSIS — I442 Atrioventricular block, complete: Secondary | ICD-10-CM | POA: Diagnosis not present

## 2023-03-23 DIAGNOSIS — I5022 Chronic systolic (congestive) heart failure: Secondary | ICD-10-CM | POA: Diagnosis not present

## 2023-03-23 DIAGNOSIS — I428 Other cardiomyopathies: Secondary | ICD-10-CM | POA: Insufficient documentation

## 2023-03-23 HISTORY — PX: ICD GENERATOR CHANGEOUT: EP1231

## 2023-03-23 SURGERY — ICD GENERATOR CHANGEOUT
Anesthesia: LOCAL

## 2023-03-23 MED ORDER — FENTANYL CITRATE (PF) 100 MCG/2ML IJ SOLN
INTRAMUSCULAR | Status: DC | PRN
  Administered 2023-03-23: 50 ug via INTRAVENOUS
  Administered 2023-03-23 (×2): 25 ug via INTRAVENOUS

## 2023-03-23 MED ORDER — MIDAZOLAM HCL 2 MG/2ML IJ SOLN
INTRAMUSCULAR | Status: AC
Start: 2023-03-23 — End: ?
  Filled 2023-03-23: qty 2

## 2023-03-23 MED ORDER — CEFAZOLIN SODIUM-DEXTROSE 2-4 GM/100ML-% IV SOLN: 2.0000 g | INTRAVENOUS | Status: AC

## 2023-03-23 MED ORDER — SODIUM CHLORIDE 0.9 % IV SOLN: INTRAVENOUS | Status: DC

## 2023-03-23 MED ORDER — SODIUM CHLORIDE 0.9 % IV SOLN: INTRAVENOUS | Status: AC

## 2023-03-23 MED ORDER — FENTANYL CITRATE (PF) 100 MCG/2ML IJ SOLN
INTRAMUSCULAR | Status: AC
  Filled 2023-03-23: qty 2

## 2023-03-23 MED ORDER — SODIUM CHLORIDE 0.9 % IV SOLN
INTRAVENOUS | Status: AC
  Administered 2023-03-23: 80 mg
  Filled 2023-03-23: qty 2

## 2023-03-23 MED ORDER — POVIDONE-IODINE 10 % EX SWAB
2.0000 | Freq: Once | CUTANEOUS | Status: AC
  Administered 2023-03-23: 2 via TOPICAL

## 2023-03-23 MED ORDER — CEFAZOLIN SODIUM-DEXTROSE 2-4 GM/100ML-% IV SOLN
INTRAVENOUS | Status: AC
  Administered 2023-03-23: 2 g via INTRAVENOUS
  Filled 2023-03-23: qty 100

## 2023-03-23 MED ORDER — LIDOCAINE HCL (PF) 1 % IJ SOLN
INTRAMUSCULAR | Status: AC
  Filled 2023-03-23: qty 60

## 2023-03-23 MED ORDER — LIDOCAINE HCL (PF) 1 % IJ SOLN
INTRAMUSCULAR | Status: DC | PRN
  Administered 2023-03-23: 50 mL

## 2023-03-23 MED ORDER — ACETAMINOPHEN 325 MG PO TABS: 325.0000 mg | ORAL_TABLET | ORAL | Status: DC | PRN

## 2023-03-23 MED ORDER — MIDAZOLAM HCL 5 MG/5ML IJ SOLN
INTRAMUSCULAR | Status: DC | PRN
  Administered 2023-03-23: 2 mg via INTRAVENOUS
  Administered 2023-03-23 (×2): 1 mg via INTRAVENOUS

## 2023-03-23 MED ORDER — SODIUM CHLORIDE 0.9 % IV SOLN: 80.0000 mg | INTRAVENOUS | Status: AC

## 2023-03-23 SURGICAL SUPPLY — 11 items
CABLE SURGICAL S-101-97-12 (CABLE) ×1 IMPLANT
DEVICE DISSECT PLASMABLAD 3.0S (MISCELLANEOUS) IMPLANT
HEMOSTAT SURGICEL 2X4 FIBR (HEMOSTASIS) IMPLANT
ICD COBALT XT QUAD CRT DTPA2QQ (ICD Generator) IMPLANT
KIT LEAD ACCESSORY 6056M PINCH (KITS) IMPLANT
KIT WRENCH (KITS) IMPLANT
PAD DEFIB RADIO PHYSIO CONN (PAD) ×1 IMPLANT
PLASMABLADE 3.0S (MISCELLANEOUS) ×1
POUCH AIGIS-R ANTIBACT ICD (Mesh General) ×1 IMPLANT
POUCH AIGIS-R ANTIBACT ICD LRG (Mesh General) IMPLANT
TRAY PACEMAKER INSERTION (PACKS) ×1 IMPLANT

## 2023-03-23 NOTE — Interval H&P Note (Signed)
 History and Physical Interval Note:  03/23/2023 9:48 AM  Joanne Bell  has presented today for surgery, with the diagnosis of icd ERI.  The various methods of treatment have been discussed with the patient and family. After consideration of risks, benefits and other options for treatment, the patient has consented to  Procedure(s): ICD GENERATOR CHANGEOUT (N/A) as a surgical intervention.  [name] history has been reviewed, patient examined, no change in status, stable for surgery.  I have reviewed the patient's chart and labs.  Questions were answered to the patient's satisfaction.     Elspeth Sage

## 2023-03-23 NOTE — Discharge Instructions (Addendum)

## 2023-03-24 ENCOUNTER — Encounter (HOSPITAL_COMMUNITY): Payer: Self-pay | Admitting: Internal Medicine

## 2023-03-24 MED FILL — Midazolam HCl Inj 2 MG/2ML (Base Equivalent): INTRAMUSCULAR | Qty: 4 | Status: AC

## 2023-04-07 ENCOUNTER — Encounter: Payer: Self-pay | Admitting: Internal Medicine

## 2023-04-08 ENCOUNTER — Ambulatory Visit: Payer: Commercial Managed Care - HMO | Attending: Cardiology

## 2023-04-08 DIAGNOSIS — I5022 Chronic systolic (congestive) heart failure: Secondary | ICD-10-CM

## 2023-04-08 DIAGNOSIS — I428 Other cardiomyopathies: Secondary | ICD-10-CM | POA: Diagnosis not present

## 2023-04-08 DIAGNOSIS — Z95810 Presence of automatic (implantable) cardiac defibrillator: Secondary | ICD-10-CM

## 2023-04-08 DIAGNOSIS — I442 Atrioventricular block, complete: Secondary | ICD-10-CM | POA: Diagnosis not present

## 2023-04-08 LAB — CUP PACEART INCLINIC DEVICE CHECK
Date Time Interrogation Session: 20250130134625
Implantable Lead Connection Status: 753985
Implantable Lead Connection Status: 753985
Implantable Lead Connection Status: 753985
Implantable Lead Implant Date: 20121210
Implantable Lead Implant Date: 20170512
Implantable Lead Implant Date: 20170512
Implantable Lead Location: 753858
Implantable Lead Location: 753859
Implantable Lead Location: 753860
Implantable Lead Model: 4598
Implantable Pulse Generator Implant Date: 20250114

## 2023-04-08 NOTE — Patient Instructions (Signed)
? ?  After Your ICD ?(Implantable Cardiac Defibrillator) ? ? ? ?Monitor your defibrillator site for redness, swelling, and drainage. Call the device clinic at [phone] if you experience these symptoms or fever/chills. ? ?Your incision was closed with Dermabond:  You may shower 1 day after your defibrillator implant and wash your incision with soap and water. Avoid lotions, ointments, or perfumes over your incision until it is well-healed. ? ?You may use a hot tub or a pool after your wound check appointment if the incision is completely closed. ? ?Do not lift, push or pull greater than 10 pounds with the affected arm until 6 weeks after your procedure. There are no other restrictions in arm movement after your wound check appointment. ? ?Your ICD is designed to protect you from life threatening heart rhythms. Because of this, you may receive a shock.  ? ?1 shock with no symptoms:  Call the office during business hours. ?1 shock with symptoms (chest pain, chest pressure, dizziness, lightheadedness, shortness of breath, overall feeling unwell):  Call 911. ?If you experience 2 or more shocks in 24 hours:  Call 911. ?If you receive a shock, you should not drive.  ?[place] DMV - no driving for 6 months if you receive appropriate therapy from your ICD.  ? ?ICD Alerts:  Some alerts are vibratory and others beep. These are NOT emergencies. Please call our office to let us know. If this occurs at night or on weekends, it can wait until the next business day. Send a remote transmission. ? ?If your device is capable of reading fluid status (for heart failure), you will be offered monthly monitoring to review this with you.  ? ?Remote monitoring is used to monitor your ICD from home. This monitoring is scheduled every 91 days by our office. It allows [place] to keep an eye on the functioning of your device to ensure it is working properly. You will routinely see your Electrophysiologist annually (more often if necessary).  ?

## 2023-04-08 NOTE — Progress Notes (Signed)
Normal ICD wound check. Wound well healed. Thresholds, sensing, and impedances consistent with implant measurements with 3.5V safety margin/auto capture until 3 month visit. No episodes.  Reviewed arm restrictions to continue for 6 weeks total post op. Reviewed shock plan.  Pt enrolled in remote follow-up.  PATIENT DEPENDENT IN THE VENTRICLE TODAY

## 2023-04-26 ENCOUNTER — Ambulatory Visit: Payer: BC Managed Care – PPO

## 2023-05-20 ENCOUNTER — Telehealth: Payer: Self-pay

## 2023-05-20 NOTE — Telephone Encounter (Signed)
 No ICM remote transmission received for 05/10/2023 and next ICM transmission scheduled for 06/07/2023.

## 2023-05-20 NOTE — Telephone Encounter (Signed)
 Attempted call to patient.  Left message requesting to send manual remote transmission today.  Left ICM call back number is assistance is needed.

## 2023-05-28 ENCOUNTER — Ambulatory Visit: Payer: Self-pay | Attending: Internal Medicine

## 2023-05-28 DIAGNOSIS — I5022 Chronic systolic (congestive) heart failure: Secondary | ICD-10-CM

## 2023-05-28 DIAGNOSIS — Z95810 Presence of automatic (implantable) cardiac defibrillator: Secondary | ICD-10-CM

## 2023-05-28 NOTE — Progress Notes (Signed)
 EPIC Encounter for ICM Monitoring  Patient Name: Joanne Bell is a 63 y.o. female Date: 05/28/2023 Primary Care Physican: Nelwyn Salisbury, MD Primary Cardiologist: Graciela Husbands Electrophysiologist: Joycelyn Schmid Pacing:  99.8%    12/03/2022 Weight: 160 lbs          03/01/2023 Office Weight: 156 lbs                                    Attempted call to patient and unable to reach.  Left detailed message per DPR regarding transmission.  Transmission results reviewed.    Optivol thoracic impedance suggesting possible fluid accumulation starting 3/11.     Prescribed:  Spironolactone 25 mg take 1 tablet by mouth daily   Recommendations:   Left voice mail with ICM number and encouraged to call if experiencing any fluid symptoms.  Follow-up plan: ICM clinic phone appointment on 06/07/2023 to recheck fluid levels.  91 day device clinic remote transmission 06/23/2023.     EP/Cardiology Office Visits:  06/28/2023 with Dr Graciela Husbands.   Copy of ICM check sent to Dr. Graciela Husbands.   3 month ICM trend: 05/25/2023.    12-14 Month ICM trend:     Karie Soda, RN 05/28/2023 10:43 AM

## 2023-06-07 ENCOUNTER — Encounter

## 2023-06-07 ENCOUNTER — Ambulatory Visit: Payer: Self-pay | Attending: Internal Medicine

## 2023-06-07 DIAGNOSIS — I5022 Chronic systolic (congestive) heart failure: Secondary | ICD-10-CM

## 2023-06-07 DIAGNOSIS — Z95810 Presence of automatic (implantable) cardiac defibrillator: Secondary | ICD-10-CM

## 2023-06-08 NOTE — Progress Notes (Signed)
 EPIC Encounter for ICM Monitoring  Patient Name: Joanne Bell is a 63 y.o. female Date: 06/08/2023 Primary Care Physican: Nelwyn Salisbury, MD Primary Cardiologist: Graciela Husbands Electrophysiologist: Joycelyn Schmid Pacing:  98.7%    12/03/2022 Weight: 160 lbs          03/01/2023 Office Weight: 156 lbs                                    Transmission results reviewed.    Optivol thoracic impedance suggesting possible fluid accumulation starting 3/11.     Prescribed:  Spironolactone 25 mg take 1 tablet by mouth daily   Recommendations:   No changes.   Follow-up plan: ICM clinic phone appointment on 07/12/2023.  91 day device clinic remote transmission 06/23/2023.     EP/Cardiology Office Visits:  06/28/2023 with Dr Graciela Husbands.   Copy of ICM check sent to Dr. Graciela Husbands.    3 month ICM trend: 06/07/2023.    12-14 Month ICM trend:     Karie Soda, RN 06/08/2023 3:29 PM

## 2023-06-23 ENCOUNTER — Ambulatory Visit (INDEPENDENT_AMBULATORY_CARE_PROVIDER_SITE_OTHER): Payer: Commercial Managed Care - HMO

## 2023-06-23 DIAGNOSIS — I428 Other cardiomyopathies: Secondary | ICD-10-CM | POA: Diagnosis not present

## 2023-06-24 LAB — CUP PACEART REMOTE DEVICE CHECK
Battery Remaining Longevity: 106 mo
Battery Voltage: 3.08 V
Brady Statistic RV Percent Paced: 98.9 %
Date Time Interrogation Session: 20250416062653
HighPow Impedance: 58 Ohm
Implantable Lead Connection Status: 753985
Implantable Lead Connection Status: 753985
Implantable Lead Connection Status: 753985
Implantable Lead Implant Date: 20121210
Implantable Lead Implant Date: 20170512
Implantable Lead Implant Date: 20170512
Implantable Lead Location: 753858
Implantable Lead Location: 753859
Implantable Lead Location: 753860
Implantable Lead Model: 4598
Implantable Pulse Generator Implant Date: 20250114
Lead Channel Impedance Value: 361 Ohm
Lead Channel Impedance Value: 361 Ohm
Lead Channel Impedance Value: 380 Ohm
Lead Channel Impedance Value: 437 Ohm
Lead Channel Impedance Value: 437 Ohm
Lead Channel Impedance Value: 456 Ohm
Lead Channel Impedance Value: 551 Ohm
Lead Channel Impedance Value: 608 Ohm
Lead Channel Impedance Value: 665 Ohm
Lead Channel Impedance Value: 665 Ohm
Lead Channel Impedance Value: 874 Ohm
Lead Channel Impedance Value: 874 Ohm
Lead Channel Impedance Value: 912 Ohm
Lead Channel Pacing Threshold Amplitude: 0.375 V
Lead Channel Pacing Threshold Amplitude: 0.625 V
Lead Channel Pacing Threshold Amplitude: 2.5 V
Lead Channel Pacing Threshold Pulse Width: 0.4 ms
Lead Channel Pacing Threshold Pulse Width: 0.4 ms
Lead Channel Pacing Threshold Pulse Width: 0.4 ms
Lead Channel Sensing Intrinsic Amplitude: 3.1 mV
Lead Channel Setting Pacing Amplitude: 1.5 V
Lead Channel Setting Pacing Amplitude: 2 V
Lead Channel Setting Pacing Amplitude: 3 V
Lead Channel Setting Pacing Pulse Width: 0.4 ms
Lead Channel Setting Pacing Pulse Width: 0.4 ms
Lead Channel Setting Sensing Sensitivity: 0.3 mV
Zone Setting Status: 755011
Zone Setting Status: 755011
Zone Setting Status: 755011

## 2023-06-25 ENCOUNTER — Other Ambulatory Visit: Payer: Self-pay | Admitting: Family Medicine

## 2023-06-25 DIAGNOSIS — Z95810 Presence of automatic (implantable) cardiac defibrillator: Secondary | ICD-10-CM

## 2023-06-28 ENCOUNTER — Encounter: Payer: Commercial Managed Care - HMO | Admitting: Internal Medicine

## 2023-07-01 ENCOUNTER — Encounter: Payer: Self-pay | Admitting: Internal Medicine

## 2023-07-01 ENCOUNTER — Ambulatory Visit: Payer: Self-pay | Admitting: Student

## 2023-07-12 ENCOUNTER — Ambulatory Visit: Payer: Self-pay | Attending: Internal Medicine

## 2023-07-12 DIAGNOSIS — Z95810 Presence of automatic (implantable) cardiac defibrillator: Secondary | ICD-10-CM

## 2023-07-12 DIAGNOSIS — I5022 Chronic systolic (congestive) heart failure: Secondary | ICD-10-CM | POA: Diagnosis not present

## 2023-07-15 NOTE — Progress Notes (Signed)
 EPIC Encounter for ICM Monitoring  Patient Name: Joanne Bell is a 63 y.o. female Date: 07/15/2023 Primary Care Physican: Donley Furth, MD Primary Cardiologist: [name]) Electrophysiologist: [name] [facility]) Bi-V Pacing:  96.1%    12/03/2022 Weight: 160 lbs          03/01/2023 Office Weight: 156 lbs                                    Transmission results reviewed.    Optivol thoracic impedance suggesting normal fluid levels within the last month.     Prescribed:  Spironolactone  25 mg take 1 tablet by mouth daily   Recommendations:   No changes.   Follow-up plan: ICM clinic phone appointment on 08/16/2023.  91 day device clinic remote transmission 09/22/2023.     EP/Cardiology Office Visits:  07/23/2023 with Michaelle Adolphus, PA.   Copy of ICM check sent to Dr. [name] [facility]).    3 month ICM trend: 07/12/2023.    12-14 Month ICM trend:     Almyra Jain, RN 07/15/2023 11:03 AM

## 2023-07-20 NOTE — Progress Notes (Unsigned)
  Electrophysiology Office Note:   ID:  Maricsa, Spille 1960-06-09, MRN [id number]  Primary Cardiologist: None Electrophysiologist: Richardo Chandler, MD  {Click to update primary MD,subspecialty MD or APP then REFRESH:1}    History of Present Illness:   Joanne Bell is a 63 y.o. female with h/o  CHB s/p PPM with subsequent NICM and upgrade to CRT-D in 2017, HTN seen today for routine electrophysiology follow-up s/p gen change.  Since last being seen in our clinic the patient reports doing ***.  she denies chest pain, palpitations, dyspnea, PND, orthopnea, nausea, vomiting, dizziness, syncope, edema, weight gain, or early satiety.    Review of systems complete and found to be negative unless listed in HPI.   EP Information / Studies Reviewed:    EKG is ordered today. Personal review as below.       ICD Interrogation-  reviewed in detail today,  See PACEART report.  Arrhythmia/Device History Medtronic BiV ICD implanted 07/19/15 for AVB (PPM > upgrade to CRT-D 2017) Gen change 03/23/2023   Physical Exam:   VS:  There were no vitals taken for this visit.   Wt Readings from Last 3 Encounters:  03/23/23 152 lb (68.9 kg)  03/22/23 152 lb 12.8 oz (69.3 kg)  03/01/23 156 lb (70.8 kg)     GEN: No acute distress *** NECK: No JVD; No carotid bruits CARDIAC: {EPRHYTHM:28826}, no murmurs, rubs, gallops RESPIRATORY:  Clear to auscultation without rales, wheezing or rhonchi  ABDOMEN: Soft, non-tender, non-distended EXTREMITIES:  {EDEMA LEVEL:28147::"No"} edema; No deformity   ASSESSMENT AND PLAN:    Chronic systolic CHF  s/p Medtronic CRT-D  Complete Heart Block euvolemic today Stable on an appropriate medical regimen Normal ICD function See Pace Art report No changes today  HTN Stable on current regimen   Disposition:   Follow up with {EPPROVIDERS:28135} {EPFOLLOW UP:28173}   Signed, Tylene Galla, PA-C

## 2023-07-23 ENCOUNTER — Ambulatory Visit: Payer: Self-pay | Attending: Student | Admitting: Student

## 2023-07-23 ENCOUNTER — Encounter: Payer: Self-pay | Admitting: Student

## 2023-07-23 VITALS — BP 160/94 | HR 59 | Ht 64.0 in | Wt 159.0 lb

## 2023-07-23 DIAGNOSIS — I10 Essential (primary) hypertension: Secondary | ICD-10-CM

## 2023-07-23 DIAGNOSIS — I442 Atrioventricular block, complete: Secondary | ICD-10-CM | POA: Diagnosis not present

## 2023-07-23 DIAGNOSIS — I5022 Chronic systolic (congestive) heart failure: Secondary | ICD-10-CM

## 2023-07-23 LAB — CUP PACEART INCLINIC DEVICE CHECK
Date Time Interrogation Session: 20250516100755
Implantable Lead Connection Status: 753985
Implantable Lead Connection Status: 753985
Implantable Lead Connection Status: 753985
Implantable Lead Implant Date: 20121210
Implantable Lead Implant Date: 20170512
Implantable Lead Implant Date: 20170512
Implantable Lead Location: 753858
Implantable Lead Location: 753859
Implantable Lead Location: 753860
Implantable Lead Model: 4598
Implantable Pulse Generator Implant Date: 20250114

## 2023-07-23 NOTE — Patient Instructions (Signed)
 Medication Instructions:  No medication changes today. *If you need a refill on your cardiac medications before your next appointment, please call your pharmacy*  Lab Work: No labwork ordered today. If you have labs (blood work) drawn today and your tests are completely normal, you will receive your results only by: MyChart Message (if you have MyChart) OR A paper copy in the mail If you have any lab test that is abnormal or we need to change your treatment, we will call you to review the results.  Testing/Procedures: No testing ordered today  Follow-Up: At [facility], you and your health needs are our priority.  As part of our continuing mission to provide you with exceptional heart care, our providers are all part of one team.  This team includes your primary Cardiologist (physician) and Advanced Practice Providers or APPs (Physician Assistants and Nurse Practitioners) who all work together to provide you with the care you need, when you need it.  Your next appointment:   12 month(s) with EP Follow up in 3 months to establish with General Cardiology.   Provider:    You may see one of the following Advanced Practice Providers on your designated Care Team:   Mertha Abrahams, Kennard Pea "Jonelle Neri" [place], PA-C Suzann Riddle, NP Creighton Doffing, NP    We recommend signing up for the patient portal called "MyChart".  Sign up information is provided on this After Visit Summary.  MyChart is used to connect with patients for Virtual Visits (Telemedicine).  Patients are able to view lab/test results, encounter notes, upcoming appointments, etc.  Non-urgent messages can be sent to your provider as well.   To learn more about what you can do with MyChart, go to [email or web address].

## 2023-07-26 ENCOUNTER — Ambulatory Visit: Payer: BC Managed Care – PPO

## 2023-08-03 ENCOUNTER — Other Ambulatory Visit: Payer: Self-pay

## 2023-08-03 ENCOUNTER — Telehealth: Payer: Self-pay | Admitting: Internal Medicine

## 2023-08-03 MED ORDER — SACUBITRIL-VALSARTAN 49-51 MG PO TABS: 1.0000 | ORAL_TABLET | Freq: Two times a day (BID) | ORAL | 0 refills | Status: DC

## 2023-08-03 NOTE — Telephone Encounter (Signed)
*  STAT* If patient is at the pharmacy, call can be transferred to refill team.   1. Which medications need to be refilled? (please list name of each medication and dose if known)   sacubitril -valsartan  (ENTRESTO ) 49-51 MG   DIFFERENT PHARMACY   4. Which pharmacy/location (including street and city if local pharmacy) is medication to be sent to?   [id number] [place] - [address] AT [facility] [address] Phone: [phone]  Fax: [phone]       5. Do they need a 30 day or 90 day supply? 30  PT LEFT HER ORIGINAL RX AT HOME AND NEEDS A SUPPLY WHILE SHE IS OUT OF TOWN

## 2023-08-04 ENCOUNTER — Ambulatory Visit: Payer: Self-pay | Admitting: Cardiology

## 2023-08-04 NOTE — Progress Notes (Signed)
 Remote ICD transmission.

## 2023-08-04 NOTE — Addendum Note (Signed)
 Addended by: Lott Rouleau A on: 08/04/2023 02:52 PM   Modules accepted: Orders

## 2023-08-16 ENCOUNTER — Ambulatory Visit: Payer: Self-pay | Attending: Internal Medicine

## 2023-08-16 DIAGNOSIS — Z95810 Presence of automatic (implantable) cardiac defibrillator: Secondary | ICD-10-CM

## 2023-08-16 DIAGNOSIS — I5022 Chronic systolic (congestive) heart failure: Secondary | ICD-10-CM | POA: Diagnosis not present

## 2023-08-18 NOTE — Progress Notes (Signed)
 EPIC Encounter for ICM Monitoring  Patient Name: Joanne Bell is a 63 y.o. female Date: 08/18/2023 Primary Care Physican: Donley Furth, MD Primary Cardiologist: [name]) Electrophysiologist: [name] [facility]) Bi-V Pacing:  97.6%    12/03/2022 Weight: 160 lbs          03/01/2023 Office Weight: 156 lbs    07/23/2023 Office Weight: 159 lbs                                 Transmission results reviewed.    Optivol thoracic impedance suggesting normal fluid levels within the last month.     Prescribed:  Spironolactone  25 mg take 1 tablet by mouth daily   Recommendations:   No changes.   Follow-up plan: ICM clinic phone appointment on 10/04/2023.  91 day device clinic remote transmission 09/22/2023.     EP/Cardiology Office Visits:  Recall 07/18/2023 with Michaelle Adolphus, PA. 10/08/2023 with Dr Audery Blazing.    Copy of ICM check sent to Dr. [name] [facility]).    3 month ICM trend: 08/16/2023.    12-14 Month ICM trend:     Almyra Jain, RN 08/18/2023 5:07 PM

## 2023-09-22 ENCOUNTER — Ambulatory Visit: Payer: Commercial Managed Care - HMO

## 2023-09-22 DIAGNOSIS — I428 Other cardiomyopathies: Secondary | ICD-10-CM

## 2023-09-23 LAB — CUP PACEART REMOTE DEVICE CHECK
Battery Remaining Longevity: 104 mo
Battery Voltage: 3.03 V
Brady Statistic RV Percent Paced: 97.02 %
Date Time Interrogation Session: 20250716073541
HighPow Impedance: 57 Ohm
Implantable Lead Connection Status: 753985
Implantable Lead Connection Status: 753985
Implantable Lead Connection Status: 753985
Implantable Lead Implant Date: 20121210
Implantable Lead Implant Date: 20170512
Implantable Lead Implant Date: 20170512
Implantable Lead Location: 753858
Implantable Lead Location: 753859
Implantable Lead Location: 753860
Implantable Lead Model: 4598
Implantable Pulse Generator Implant Date: 20250114
Lead Channel Impedance Value: 342 Ohm
Lead Channel Impedance Value: 361 Ohm
Lead Channel Impedance Value: 361 Ohm
Lead Channel Impedance Value: 437 Ohm
Lead Channel Impedance Value: 437 Ohm
Lead Channel Impedance Value: 456 Ohm
Lead Channel Impedance Value: 551 Ohm
Lead Channel Impedance Value: 627 Ohm
Lead Channel Impedance Value: 646 Ohm
Lead Channel Impedance Value: 684 Ohm
Lead Channel Impedance Value: 874 Ohm
Lead Channel Impedance Value: 893 Ohm
Lead Channel Impedance Value: 912 Ohm
Lead Channel Pacing Threshold Amplitude: 0.5 V
Lead Channel Pacing Threshold Amplitude: 0.625 V
Lead Channel Pacing Threshold Amplitude: 2.5 V
Lead Channel Pacing Threshold Pulse Width: 0.4 ms
Lead Channel Pacing Threshold Pulse Width: 0.4 ms
Lead Channel Pacing Threshold Pulse Width: 0.4 ms
Lead Channel Sensing Intrinsic Amplitude: 2.6 mV
Lead Channel Setting Pacing Amplitude: 1.5 V
Lead Channel Setting Pacing Amplitude: 2 V
Lead Channel Setting Pacing Amplitude: 3 V
Lead Channel Setting Pacing Pulse Width: 0.4 ms
Lead Channel Setting Pacing Pulse Width: 0.4 ms
Lead Channel Setting Sensing Sensitivity: 0.3 mV
Zone Setting Status: 755011
Zone Setting Status: 755011
Zone Setting Status: 755011

## 2023-09-30 NOTE — Progress Notes (Signed)
 HPI: Follow-up nonischemic cardiomyopathy, hypertension and history of pacemaker for complete heart block.  Previously followed by Dr. Fernande.  Cardiac MRI December 2012 showed ejection fraction 68% and no infiltrative disease noted.  Had pacemaker placed for complete heart block and was ultimately upgraded to CRT D due to worsening LV function.  Last echocardiogram August 2024 showed normal LV function, grade 1 diastolic dysfunction, mild right ventricular enlargement, mild mitral regurgitation.  Since last seen the patient denies any dyspnea on exertion, orthopnea, PND, pedal edema, palpitations, syncope or chest pain.   Current Outpatient Medications  Medication Sig Dispense Refill   carvedilol  (COREG ) 25 MG tablet Take 1 tablet (25 mg total) by mouth 2 (two) times daily with a meal. 180 tablet 2   omeprazole  (PRILOSEC) 40 MG capsule TAKE 1 CAPSULE (40 MG TOTAL) BY MOUTH DAILY. (Patient taking differently: Take 40 mg by mouth as needed.) 30 capsule 5   sacubitril -valsartan  (ENTRESTO ) 49-51 MG Take 1 tablet by mouth 2 (two) times daily. 60 tablet 0   spironolactone  (ALDACTONE ) 25 MG tablet Take 1 tablet (25 mg total) by mouth daily. (Patient taking differently: Take 25 mg by mouth as needed.) 90 tablet 2   [employer] HAIR REGROWTH FOR MEN 5 % FOAM Apply 1 application  topically daily. (Patient not taking: Reported on 10/08/2023)     ketoconazole (NIZORAL) 2 % shampoo Apply 1 Application topically See admin instructions. Apply to scalp and shampoo as usual. Repeat every 1-2 weeks. (Patient not taking: Reported on 10/08/2023)     vitamin E  1000 UNIT capsule Take 1 capsule by mouth daily.     No current facility-administered medications for this visit.     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/20/2015   Chronic systolic CHF (congestive heart  failure), NYHA class 3 (HCC)    Colon polyps    Fibroids, intramural    Hypertension    NICM (nonischemic cardiomyopathy) (HCC) 07/20/2015    Past Surgical History:  Procedure Laterality Date   COLONOSCOPY  05/24/2019   per Dr. Rosalie, adenomatous polyps, repeat in 5 yrs   ENDOMETRIAL ABLATION  03/09/2002   EP IMPLANTABLE DEVICE N/A 07/19/2015   Procedure: Upgrade to BIV ICD ;  Surgeon: Elspeth JAYSON Fernande, MD;  Location: [facility] INVASIVE CV LAB;  Service: Cardiovascular;  Laterality: N/A;   ICD GENERATOR CHANGEOUT N/A 03/23/2023   Procedure: ICD GENERATOR CHANGEOUT;  Surgeon: Fernande Elspeth JAYSON, MD;  Location: [facility] INVASIVE CV LAB;  Service: Cardiovascular;  Laterality: N/A;   MYOMECTOMY  03/09/1996   per Dr. Rutherford   PERMANENT PACEMAKER INSERTION Left 02/16/2011   Procedure: PERMANENT PACEMAKER INSERTION;  Surgeon: Elspeth JAYSON Fernande, MD;  Location: [facility] CATH LAB;  Service: Cardiovascular;  Laterality: Left;    Social History   Socioeconomic History   Marital status: Married    Spouse name: Not on file   Number of children: 2   Years of education: Not on file   Highest education level: Some college, no degree  Occupational History    Employer: [employer]    Comment: Diplomatic Services operational officer  Tobacco Use   Smoking status: Never   Smokeless tobacco: Never  Substance and Sexual Activity   Alcohol use: No    Alcohol/week: 0.0 standard drinks of alcohol   Drug use: No   Sexual activity: Not on file  Other Topics  Concern   Not on file  Social History Narrative   Not on file   Social Drivers of Health   Financial Resource Strain: Low Risk  (01/31/2023)   Overall Financial Resource Strain (CARDIA)    Difficulty of Paying Living Expenses: Not very hard  Food Insecurity: Food Insecurity Present (01/31/2023)   Hunger Vital Sign    Worried About Running Out of Food in the Last Year: Patient declined    Ran Out of Food in the Last Year: Sometimes true  Transportation Needs: No Transportation Needs (01/31/2023)    PRAPARE - Administrator, Civil Service (Medical): No    Lack of Transportation (Non-Medical): No  Physical Activity: Insufficiently Active (01/31/2023)   Exercise Vital Sign    Days of Exercise per Week: 2 days    Minutes of Exercise per Session: 20 min  Stress: No Stress Concern Present (01/31/2023)   [employer] of Occupational Health - Occupational Stress Questionnaire    Feeling of Stress : Not at all  Social Connections: Socially Integrated (01/31/2023)   Social Connection and Isolation Panel    Frequency of Communication with Friends and Family: More than three times a week    Frequency of Social Gatherings with Friends and Family: Once a week    Attends Religious Services: 1 to 4 times per year    Active Member of [employer] or Organizations: Yes    Attends Engineer, structural: More than 4 times per year    Marital Status: Married  Catering manager Violence: Not on file    Family History  Problem Relation Age of Onset   Depression Mother    Arthritis Father    Diabetes Father    Hyperlipidemia Father    Hypertension Father    Stroke Father     ROS: no fevers or chills, productive cough, hemoptysis, dysphasia, odynophagia, melena, hematochezia, dysuria, hematuria, rash, seizure activity, orthopnea, PND, pedal edema, claudication. Remaining systems are negative.  Physical Exam: Well-developed well-nourished in no acute distress.  Skin is warm and dry.  HEENT is normal.  Neck is supple.  Chest is clear to auscultation with normal expansion.  Cardiovascular exam is regular rate and rhythm.  Abdominal exam nontender or distended. No masses palpated. Extremities show no edema. neuro grossly intact  A/P  1 chronic diastolic congestive heart failure-patient is euvolemic on examination.  Will continue diuretic at present dose.  Check potassium and renal function.  2 history of nonischemic cardiomyopathy-this is presumed as I cannot find evidence of  prior heart catheterization.  However LV function improved to normal on most recent echocardiogram after CRT-D.  Continue Entresto  and carvedilol .  3 hypertension-blood pressure is elevated.  However she follows this at home and it is typically controlled.  Will continue present dose of Entresto  but can advance in future if needed.  She states that 97/103 call dizziness in the past.  4 status post CRT-D-followed by electrophysiology.  Redell Shallow, MD

## 2023-10-04 ENCOUNTER — Ambulatory Visit: Attending: Internal Medicine

## 2023-10-04 DIAGNOSIS — I5022 Chronic systolic (congestive) heart failure: Secondary | ICD-10-CM

## 2023-10-04 DIAGNOSIS — Z95810 Presence of automatic (implantable) cardiac defibrillator: Secondary | ICD-10-CM

## 2023-10-08 ENCOUNTER — Encounter: Payer: Self-pay | Admitting: Cardiology

## 2023-10-08 ENCOUNTER — Ambulatory Visit: Attending: Cardiology | Admitting: Cardiology

## 2023-10-08 VITALS — BP 170/100 | HR 60 | Ht 64.0 in | Wt 160.0 lb

## 2023-10-08 DIAGNOSIS — I5022 Chronic systolic (congestive) heart failure: Secondary | ICD-10-CM | POA: Diagnosis not present

## 2023-10-08 DIAGNOSIS — I10 Essential (primary) hypertension: Secondary | ICD-10-CM | POA: Diagnosis not present

## 2023-10-08 DIAGNOSIS — I428 Other cardiomyopathies: Secondary | ICD-10-CM | POA: Diagnosis not present

## 2023-10-08 DIAGNOSIS — Z95810 Presence of automatic (implantable) cardiac defibrillator: Secondary | ICD-10-CM

## 2023-10-08 NOTE — Patient Instructions (Signed)
   Follow-Up: At [facility], you and your health needs are our priority.  As part of our continuing mission to provide you with exceptional heart care, our providers are all part of one team.  This team includes your primary Cardiologist (physician) and Advanced Practice Providers or APPs (Physician Assistants and Nurse Practitioners) who all work together to provide you with the care you need, when you need it.  Your next appointment:   6 month(s)  Provider:   Redell Shallow MD

## 2023-10-08 NOTE — Progress Notes (Signed)
 EPIC Encounter for ICM Monitoring  Patient Name: Joanne Bell is a 63 y.o. female Date: 10/08/2023 Primary Care Physican: Johnny Garnette LABOR, MD Primary Cardiologist: Fernande Cowden) Electrophysiologist: Fernande [facility]) Bi-V Pacing:  99.6%    12/03/2022 Weight: 160 lbs          03/01/2023 Office Weight: 156 lbs    07/23/2023 Office Weight: 159 lbs                                 Transmission results reviewed.    Optivol thoracic impedance suggesting intermittent days with possible minimal fluid accumulation within the last month.     Prescribed:  Spironolactone  25 mg take 1 tablet by mouth daily   Recommendations:   No changes.   Follow-up plan: ICM clinic phone appointment on 11/09/2023.  91 day device clinic remote transmission 12/22/2023.     EP/Cardiology Office Visits:  Recall 07/18/2023 with Jodie Passey, PA.    10/08/2023 with Dr Pietro.    Copy of ICM check sent to Dr. Fernande [facility]).    3 month ICM trend: 10/04/2023.    12-14 Month ICM trend:     [name], RN 10/08/2023 10:07 AM

## 2023-10-09 ENCOUNTER — Ambulatory Visit: Payer: Self-pay | Admitting: Cardiology

## 2023-10-09 ENCOUNTER — Encounter: Payer: Self-pay | Admitting: Internal Medicine

## 2023-10-09 ENCOUNTER — Encounter: Payer: Self-pay | Admitting: Cardiology

## 2023-10-09 LAB — BASIC METABOLIC PANEL WITH GFR
BUN/Creatinine Ratio: 11 — ABNORMAL LOW (ref 12–28)
BUN: 10 mg/dL (ref 8–27)
CO2: 24 mmol/L (ref 20–29)
Calcium: 9.5 mg/dL (ref 8.7–10.3)
Chloride: 103 mmol/L (ref 96–106)
Creatinine, Ser: 0.88 mg/dL (ref 0.57–1.00)
Glucose: 92 mg/dL (ref 70–99)
Potassium: 3.7 mmol/L (ref 3.5–5.2)
Sodium: 141 mmol/L (ref 134–144)
eGFR: 73 mL/min/1.73 (ref >59)

## 2023-10-28 ENCOUNTER — Other Ambulatory Visit: Payer: Self-pay | Admitting: Internal Medicine

## 2023-11-04 ENCOUNTER — Telehealth: Payer: Self-pay | Admitting: Internal Medicine

## 2023-11-04 MED ORDER — SACUBITRIL-VALSARTAN 49-51 MG PO TABS: 1.0000 | ORAL_TABLET | Freq: Two times a day (BID) | ORAL | 5 refills | Status: DC

## 2023-11-04 NOTE — Telephone Encounter (Signed)
 Patient calling the office for samples of medication:   1.  What medication and dosage are you requesting samples for?  sacubitril -valsartan  (ENTRESTO ) 49-51 MG   2.  Are you currently out of this medication?   Yes  Patient stated she is out of this medication and wants to get samples until her medication comes in.

## 2023-11-04 NOTE — Telephone Encounter (Signed)
 No Entresto  samples available. Will send to local pharmacy if patient is interested.

## 2023-11-04 NOTE — Telephone Encounter (Signed)
 Spoke with pt, short supply of entresto  sent to local pharmacy.

## 2023-11-05 ENCOUNTER — Other Ambulatory Visit: Payer: Self-pay | Admitting: Internal Medicine

## 2023-11-09 ENCOUNTER — Ambulatory Visit: Attending: Student in an Organized Health Care Education/Training Program

## 2023-11-09 ENCOUNTER — Ambulatory Visit: Admitting: Family Medicine

## 2023-11-09 DIAGNOSIS — I5022 Chronic systolic (congestive) heart failure: Secondary | ICD-10-CM

## 2023-11-09 DIAGNOSIS — Z95810 Presence of automatic (implantable) cardiac defibrillator: Secondary | ICD-10-CM

## 2023-11-11 NOTE — Progress Notes (Addendum)
 EPIC Encounter for ICM Monitoring  Patient Name: Joanne Bell is a 63 y.o. female Date: 11/11/2023 Primary Care Physican: Johnny Garnette LABOR, MD Primary Cardiologist: Al[place] Electrophysiologist: Almetta Pore Pacing:  98.8%    12/03/2022 Weight: 160 lbs          03/01/2023 Office Weight: 156 lbs    07/23/2023 Office Weight: 159 lbs    10/08/2023 Office Weight: 160 lbs                              Transmission results reviewed.    Optivol thoracic impedance suggesting normal fluid levels within the last month.     Prescribed:  Spironolactone  25 mg take 1 tablet by mouth daily   Recommendations:   No changes.   Follow-up plan: ICM clinic phone appointment on 12/23/2023.  91 day device clinic remote transmission 12/22/2023.     EP/Cardiology Office Visits:  Recall 07/18/2023 with Jodie Passey, PA.    10/08/2023 with Dr Pietro.    Copy of ICM check sent to Dr. Almetta.   3 month ICM trend: 11/09/2023.    12-14 Month ICM trend:     [name], RN 11/11/2023 10:41 AM

## 2023-11-22 ENCOUNTER — Telehealth: Payer: Self-pay | Admitting: Cardiology

## 2023-11-22 ENCOUNTER — Telehealth: Payer: Self-pay | Admitting: Internal Medicine

## 2023-11-22 ENCOUNTER — Ambulatory Visit (INDEPENDENT_AMBULATORY_CARE_PROVIDER_SITE_OTHER): Admitting: Family Medicine

## 2023-11-22 VITALS — BP 128/80 | HR 59 | Temp 97.9°F | Ht 64.5 in | Wt 160.2 lb

## 2023-11-22 DIAGNOSIS — M159 Polyosteoarthritis, unspecified: Secondary | ICD-10-CM

## 2023-11-22 DIAGNOSIS — Z95810 Presence of automatic (implantable) cardiac defibrillator: Secondary | ICD-10-CM | POA: Diagnosis not present

## 2023-11-22 DIAGNOSIS — I10 Essential (primary) hypertension: Secondary | ICD-10-CM

## 2023-11-22 DIAGNOSIS — I5022 Chronic systolic (congestive) heart failure: Secondary | ICD-10-CM

## 2023-11-22 DIAGNOSIS — R739 Hyperglycemia, unspecified: Secondary | ICD-10-CM | POA: Diagnosis not present

## 2023-11-22 MED ORDER — OMEPRAZOLE 40 MG PO CPDR: 40.0000 mg | DELAYED_RELEASE_CAPSULE | Freq: Every day | ORAL | 3 refills | Status: AC

## 2023-11-22 MED ORDER — SPIRONOLACTONE 25 MG PO TABS: 25.0000 mg | ORAL_TABLET | Freq: Every day | ORAL | 2 refills | Status: AC

## 2023-11-22 MED ORDER — SPIRONOLACTONE 25 MG PO TABS: 25.0000 mg | ORAL_TABLET | Freq: Every day | ORAL | 2 refills | Status: DC

## 2023-11-22 NOTE — Telephone Encounter (Signed)
*  STAT* If patient is at the pharmacy, call can be transferred to refill team.   1. Which medications need to be refilled? (please list name of each medication and dose if known) spironolactone (ALDACTONE) 25 MG tablet   2. Which pharmacy/location (including street and city if local pharmacy) is medication to be sent to? [facility] [phone] - [place], [place] - [address]   3. Do they need a 30 day or 90 day supply? 90

## 2023-11-22 NOTE — Telephone Encounter (Signed)
 Pt's medication was sent to pt's pharmacy as requested. Confirmation received.

## 2023-11-22 NOTE — Progress Notes (Signed)
 Subjective:    Patient ID: Joanne Bell, female    DOB: October 26, 1960, 63 y.o.   MRN: [id number]  HPI Here to follow up on issues. She feels well in general. Her BP is stable. She sees Cardiology regularly for CHF and ischemic cardiomyopathy. Her OA is stable.    Review of Systems  Constitutional: Negative.   HENT: Negative.    Eyes: Negative.   Respiratory: Negative.    Cardiovascular: Negative.   Gastrointestinal: Negative.   Genitourinary:  Negative for decreased urine volume, difficulty urinating, dyspareunia, dysuria, enuresis, flank pain, frequency, hematuria, pelvic pain and urgency.  Musculoskeletal: Negative.   Skin: Negative.   Neurological: Negative.  Negative for headaches.  Psychiatric/Behavioral: Negative.         Objective:   Physical Exam Constitutional:      General: She is not in acute distress.    Appearance: Normal appearance. She is well-developed.  HENT:     Head: Normocephalic and atraumatic.     Right Ear: External ear normal.     Left Ear: External ear normal.     Nose: Nose normal.     Mouth/Throat:     Pharynx: No oropharyngeal exudate.  Eyes:     General: No scleral icterus.    Conjunctiva/sclera: Conjunctivae normal.     Pupils: Pupils are equal, round, and reactive to light.  Neck:     Thyroid : No thyromegaly.     Vascular: No JVD.  Cardiovascular:     Rate and Rhythm: Normal rate and regular rhythm.     Pulses: Normal pulses.     Heart sounds: Normal heart sounds. No murmur heard.    No friction rub. No gallop.  Pulmonary:     Effort: Pulmonary effort is normal. No respiratory distress.     Breath sounds: Normal breath sounds. No wheezing or rales.  Chest:     Chest wall: No tenderness.  Abdominal:     General: Bowel sounds are normal. There is no distension.     Palpations: Abdomen is soft. There is no mass.     Tenderness: There is no abdominal tenderness. There is no guarding or rebound.  Musculoskeletal:         General: No tenderness. Normal range of motion.     Cervical back: Normal range of motion and neck supple.  Lymphadenopathy:     Cervical: No cervical adenopathy.  Skin:    General: Skin is warm and dry.     Findings: No erythema or rash.  Neurological:     General: No focal deficit present.     Mental Status: She is alert and oriented to person, place, and time.     Cranial Nerves: No cranial nerve deficit.     Motor: No abnormal muscle tone.     Coordination: Coordination normal.     Deep Tendon Reflexes: Reflexes are normal and symmetric. Reflexes normal.  Psychiatric:        Mood and Affect: Mood normal.        Behavior: Behavior normal.        Thought Content: Thought content normal.        Judgment: Judgment normal.           Assessment & Plan:  Her HTN is stable. Her ischemic cardiomyopathy and chronic congestive CHF are stable. Her OA is stable. Get labs to check lipids, renal function, etc. We spent a total of (34   ) minutes reviewing records and discussing these issues.  Garnette Olmsted, MD

## 2023-11-23 ENCOUNTER — Ambulatory Visit: Payer: Self-pay | Admitting: Family Medicine

## 2023-11-23 LAB — BASIC METABOLIC PANEL WITH GFR
BUN: 11 mg/dL (ref 6–23)
CO2: 27 meq/L (ref 19–32)
Calcium: 9.4 mg/dL (ref 8.4–10.5)
Chloride: 104 meq/L (ref 96–112)
Creatinine, Ser: 0.85 mg/dL (ref 0.40–1.20)
GFR: 71.93 mL/min (ref >60.00)
Glucose, Bld: 79 mg/dL (ref 70–99)
Potassium: 3.8 meq/L (ref 3.5–5.1)
Sodium: 140 meq/L (ref 135–145)

## 2023-11-23 LAB — LIPID PANEL
Cholesterol: 200 mg/dL (ref 0–200)
HDL: 43.5 mg/dL (ref >39.00)
LDL Cholesterol: 123 mg/dL — ABNORMAL HIGH (ref 0–99)
NonHDL: 156.3
Total CHOL/HDL Ratio: 5
Triglycerides: 167 mg/dL — ABNORMAL HIGH (ref 0.0–149.0)
VLDL: 33.4 mg/dL (ref 0.0–40.0)

## 2023-11-23 LAB — HEPATIC FUNCTION PANEL
ALT: 13 U/L (ref 0–35)
AST: 17 U/L (ref 0–37)
Albumin: 4.4 g/dL (ref 3.5–5.2)
Alkaline Phosphatase: 51 U/L (ref 39–117)
Bilirubin, Direct: 0 mg/dL (ref 0.0–0.3)
Total Bilirubin: 0.3 mg/dL (ref 0.2–1.2)
Total Protein: 7.2 g/dL (ref 6.0–8.3)

## 2023-11-23 LAB — CBC WITH DIFFERENTIAL/PLATELET
Basophils Absolute: 0 K/uL (ref 0.0–0.1)
Basophils Relative: 1 % (ref 0.0–3.0)
Eosinophils Absolute: 0.1 K/uL (ref 0.0–0.7)
Eosinophils Relative: 1.3 % (ref 0.0–5.0)
HCT: 34.4 % — ABNORMAL LOW (ref 36.0–46.0)
Hemoglobin: 11.3 g/dL — ABNORMAL LOW (ref 12.0–15.0)
Lymphocytes Relative: 45.6 % (ref 12.0–46.0)
Lymphs Abs: 2.2 K/uL (ref 0.7–4.0)
MCHC: 32.7 g/dL (ref 30.0–36.0)
MCV: 83.2 fl (ref 78.0–100.0)
Monocytes Absolute: 0.4 K/uL (ref 0.1–1.0)
Monocytes Relative: 8.6 % (ref 3.0–12.0)
Neutro Abs: 2.1 K/uL (ref 1.4–7.7)
Neutrophils Relative %: 43.5 % (ref 43.0–77.0)
Platelets: 230 K/uL (ref 150.0–400.0)
RBC: 4.14 Mil/uL (ref 3.87–5.11)
RDW: 13.9 % (ref 11.5–15.5)
WBC: 4.7 K/uL (ref 4.0–10.5)

## 2023-11-23 LAB — HEMOGLOBIN A1C: Hgb A1c MFr Bld: 7.1 % — ABNORMAL HIGH (ref 4.6–6.5)

## 2023-11-23 LAB — TSH: TSH: 2.72 u[IU]/mL (ref 0.35–5.50)

## 2023-12-02 ENCOUNTER — Telehealth: Payer: Self-pay | Admitting: *Deleted

## 2023-12-02 NOTE — Telephone Encounter (Signed)
 Copied from CRM #[id number]. Topic: General - Call Back - No Documentation >> Dec 02, 2023  4:45 PM Shereese L wrote: Reason for CRM: Patient called in to return office call. Adv patient that she will be receiving a call back

## 2023-12-03 NOTE — Telephone Encounter (Signed)
 Spoke with pt reviewed lab results pt voiced understanding

## 2023-12-15 NOTE — Progress Notes (Signed)
 Remote ICD Transmission

## 2023-12-17 ENCOUNTER — Telehealth: Payer: Self-pay | Admitting: Cardiology

## 2023-12-17 NOTE — Telephone Encounter (Signed)
 Sob with activity over the last 4 days. No chest pain, no dizziness, and does not feel like her heartbeat is irregular.   Appt made for 12/22/23 with Hao Meng, PA-C.   Given ER precautions. She verbalized understanding.

## 2023-12-17 NOTE — Telephone Encounter (Signed)
 Thank you for the update, yes, a device interrogation would be helpful since the patient has a biventricular device, we should be able to figure if she is volume overloaded based on device interrogation.  Joanne Bell

## 2023-12-17 NOTE — Telephone Encounter (Signed)
Left message and call back number.

## 2023-12-17 NOTE — Telephone Encounter (Signed)
 Per patient schedule message:   Pt c/o Shortness Of Breath: STAT if SOB developed within the last 24 hours or pt is noticeably SOB on the phone  1. Are you currently SOB (can you hear that pt is SOB on the phone)?  Not on the phone with patient.  2. How long have you been experiencing SOB?  Four days. In the morning until about 5p. Today I woke up no shortness  of breath.  3. Are you SOB when sitting or when up moving around?   Only when up moving/walking.  4. Are you currently experiencing any other symptoms?  No other symptoms.

## 2023-12-17 NOTE — Telephone Encounter (Signed)
 Called patient to request manual transmission. No answer. Left message asking patient to send transmission or call the clinic for assistance.

## 2023-12-20 ENCOUNTER — Ambulatory Visit: Attending: Student in an Organized Health Care Education/Training Program

## 2023-12-20 ENCOUNTER — Encounter

## 2023-12-20 ENCOUNTER — Telehealth: Payer: Self-pay

## 2023-12-20 DIAGNOSIS — Z95810 Presence of automatic (implantable) cardiac defibrillator: Secondary | ICD-10-CM | POA: Diagnosis not present

## 2023-12-20 DIAGNOSIS — I5022 Chronic systolic (congestive) heart failure: Secondary | ICD-10-CM

## 2023-12-20 LAB — CUP PACEART REMOTE DEVICE CHECK
Battery Remaining Longevity: 100 mo
Battery Voltage: 3.01 V
Brady Statistic RV Percent Paced: 98.94 %
Date Time Interrogation Session: 20251013074934
HighPow Impedance: 58 Ohm
Implantable Lead Connection Status: 753985
Implantable Lead Connection Status: 753985
Implantable Lead Connection Status: 753985
Implantable Lead Implant Date: 20121210
Implantable Lead Implant Date: 20170512
Implantable Lead Implant Date: 20170512
Implantable Lead Location: 753858
Implantable Lead Location: 753859
Implantable Lead Location: 753860
Implantable Lead Model: 4598
Implantable Pulse Generator Implant Date: 20250114
Lead Channel Impedance Value: 380 Ohm
Lead Channel Impedance Value: 380 Ohm
Lead Channel Impedance Value: 399 Ohm
Lead Channel Impedance Value: 437 Ohm
Lead Channel Impedance Value: 437 Ohm
Lead Channel Impedance Value: 494 Ohm
Lead Channel Impedance Value: 589 Ohm
Lead Channel Impedance Value: 646 Ohm
Lead Channel Impedance Value: 703 Ohm
Lead Channel Impedance Value: 703 Ohm
Lead Channel Impedance Value: 950 Ohm
Lead Channel Impedance Value: 950 Ohm
Lead Channel Impedance Value: 988 Ohm
Lead Channel Pacing Threshold Amplitude: 0.375 V
Lead Channel Pacing Threshold Amplitude: 0.625 V
Lead Channel Pacing Threshold Amplitude: 2.75 V
Lead Channel Pacing Threshold Pulse Width: 0.4 ms
Lead Channel Pacing Threshold Pulse Width: 0.4 ms
Lead Channel Pacing Threshold Pulse Width: 0.4 ms
Lead Channel Sensing Intrinsic Amplitude: 2.4 mV
Lead Channel Setting Pacing Amplitude: 1.5 V
Lead Channel Setting Pacing Amplitude: 2 V
Lead Channel Setting Pacing Amplitude: 3.25 V
Lead Channel Setting Pacing Pulse Width: 0.4 ms
Lead Channel Setting Pacing Pulse Width: 0.4 ms
Lead Channel Setting Sensing Sensitivity: 0.3 mV
Zone Setting Status: 755011
Zone Setting Status: 755011
Zone Setting Status: 755011

## 2023-12-20 NOTE — Telephone Encounter (Signed)
 Remote transmission received 12/20/23.  Normal device function. Presenting rhythm AS/BVP 72 bpm. BVP 98.9%. No episodes logged. Optivol is WNL.

## 2023-12-20 NOTE — Telephone Encounter (Signed)
 Attempted return call to patient regarding change from Dr Fernande to different EP physician and will ICM monthly follow up continue.  Left message answering question that ICM monthly follow up will continue.  Left call back number for any further questions.

## 2023-12-20 NOTE — Progress Notes (Signed)
 EPIC Encounter for ICM Monitoring  Patient Name: Joanne Bell is a 63 y.o. female Date: 12/20/2023 Primary Care Physican: Johnny Garnette LABOR, MD Primary Cardiologist: Pietro Electrophysiologist: Almetta Pore Pacing:  98.9%    12/03/2022 Weight: 160 lbs          03/01/2023 Office Weight: 156 lbs    07/23/2023 Office Weight: 159 lbs    10/08/2023 Office Weight: 160 lbs                              Spoke with patient and heart failure questions reviewed.  Transmission results reviewed.  Pt reports SOB daily for past 2 weeks but has had intermittent SOB since she received new device battery.  She gets SOB during routine walking [place] distances.  BP is around 107/69.   Optivol thoracic impedance suggesting normal fluid levels within the last month.     Prescribed:  Spironolactone  25 mg take 1 tablet by mouth daily.  Pt reports 12/20/2023 she is not taking.     Recommendations:  Optivol within normal limits.   Pt has appt with Hao Meng, PA on 12/23/2023 due to shortness of breath.    Follow-up plan: ICM clinic phone appointment on 01/24/2024.  91 day device clinic remote transmission 12/22/2023.     EP/Cardiology Office Visits:  Recall 07/17/2024 with Jodie Passey, PA.    Recall 04/05/2024 with Dr Pietro.    Copy of ICM check sent to Dr. Almetta.   Remote monitoring is medically necessary for Heart Failure Management.    90 day Daily Thoracic Impedance ICM trend: 09/20/2023 through 12/20/2023.    12-14 Month Thoracic Impedance ICM trend:     [name], RN 12/20/2023 3:04 PM

## 2023-12-22 ENCOUNTER — Ambulatory Visit: Attending: Cardiovascular Disease | Admitting: Physician Assistant

## 2023-12-22 ENCOUNTER — Ambulatory Visit (INDEPENDENT_AMBULATORY_CARE_PROVIDER_SITE_OTHER): Payer: Commercial Managed Care - HMO

## 2023-12-22 VITALS — BP 132/82 | HR 61 | Wt 161.0 lb

## 2023-12-22 DIAGNOSIS — I10 Essential (primary) hypertension: Secondary | ICD-10-CM

## 2023-12-22 DIAGNOSIS — R0609 Other forms of dyspnea: Secondary | ICD-10-CM

## 2023-12-22 DIAGNOSIS — I428 Other cardiomyopathies: Secondary | ICD-10-CM | POA: Diagnosis not present

## 2023-12-22 DIAGNOSIS — I5022 Chronic systolic (congestive) heart failure: Secondary | ICD-10-CM

## 2023-12-22 DIAGNOSIS — Z95810 Presence of automatic (implantable) cardiac defibrillator: Secondary | ICD-10-CM

## 2023-12-22 NOTE — Progress Notes (Addendum)
 Cardiology Office Note   Date:  12/22/2023  ID:  Birdena, Kingma 07/20/1960, MRN [id number] PCP: Johnny Garnette LABOR, MD  [facility] Providers Cardiologist:  Redell Shallow, MD Electrophysiologist:  Donnice LABOR Primus, MD     History of Present Illness Joanne Bell is a 63 y.o. female with past medical history of presumed nonischemic cardiomyopathy (no prior record of cardiac cath), chronic diastolic heart failure, hypertension, hyperlipidemia and CHB s/p CRT-D.  Cardiac MRI in December 2012 showed EF 68%, no infiltrative heart disease.  He had a pacemaker placed for complete heart block and eventually upgraded to CRT-D due to worsening LV function. EF was as low as 30 to 35% in November 2017.  By September 2019, ejection fraction normalized to 55%.  Echocardiogram in August 2024 showed normal EF, grade 1 DD, mild RV enlargement, moderate MR.  Patient was last seen by Dr. Shallow in August 2025 at which time he was doing well.  She had dizziness on 97-130 mg of Entresto  in the past, dose has been decreased to 49-51 mg twice a day.  She called her cardiology service on 12/20/2023 with complaint of shortness of breath for 2 weeks.  Recent device interrogation showed her Optivol reading is within normal limit.  Blood work obtained by PCP a month ago showed normal renal function, electrolyte, red blood cell count.  Hemoglobin A1c was 7.1 which make the patient had new diabetic.  LDL was elevated.  She appears to be euvolemic on exam.  She does not have is any significant heart murmur.  Her lung is clear.  I recommend a coronary CTA and echocardiogram.  If coronary CTA shows coronary artery disease, I will add statin medication.  Her blood pressure test sometimes will drop down to the low 100-110 range, however it is usually very well-controlled.   ROS:   Patient complains of shortness of breath for 2 weeks.  She has no lower extremity edema, chest pain, orthopnea or  PND  Studies Reviewed      Cardiac Studies & Procedures   ______________________________________________________________________________________________     ECHOCARDIOGRAM  ECHOCARDIOGRAM COMPLETE 10/14/2022  Narrative ECHOCARDIOGRAM REPORT    Patient Name:   Joanne Bell Date of Exam: 10/14/2022 Medical Rec #:  [id number]             Height:       64.5 in Accession #:    [id number]            Weight:       158.2 lb Date of Birth:  1960/04/19             BSA:          1.780 m Patient Age:    62 years              BP:           150/86 mmHg Patient Gender: F                     HR:           48 bpm. Exam Location:  [address]  Procedure: 2D Echo, 3D Echo, Cardiac Doppler and Color Doppler  Indications:    I42.9 Cardiomyopathy  History:        Patient has prior history of Echocardiogram examinations, most recent 11/23/2017. CHF, Defibrillator; Risk Factors:Hypertension. Non-Ishcemic Cardiomyopathy, Complete AV Block.  Sonographer:    Heather Hawks RDCS Referring Phys: STEVEN C KLEIN  IMPRESSIONS  1. Left ventricular ejection fraction, by estimation, is 55 to 60%. The left ventricle has normal function. The left ventricle has no regional wall motion abnormalities. Left ventricular diastolic parameters are consistent with Grade I diastolic dysfunction (impaired relaxation). 2. Right ventricular systolic function is normal. The right ventricular size is mildly enlarged. There is normal pulmonary artery systolic pressure. The estimated right ventricular systolic pressure is 32.4 mmHg. 3. The mitral valve is grossly normal. Mild mitral valve regurgitation. No evidence of mitral stenosis. 4. The aortic valve is tricuspid. Aortic valve regurgitation is not visualized. No aortic stenosis is present. 5. The inferior vena cava is normal in size with greater than 50% respiratory variability, suggesting right atrial pressure of 3 mmHg.  Comparison(s): Prior images unable to  be directly viewed, comparison made by report only. No significant change from prior study.  FINDINGS Left Ventricle: Left ventricular ejection fraction, by estimation, is 55 to 60%. The left ventricle has normal function. The left ventricle has no regional wall motion abnormalities. 3D ejection fraction reviewed and evaluated as part of the interpretation. Alternate measurement of EF is felt to be most reflective of LV function. The left ventricular internal cavity size was normal in size. There is no left ventricular hypertrophy. Abnormal (paradoxical) septal motion, consistent with RV pacemaker. Left ventricular diastolic parameters are consistent with Grade I diastolic dysfunction (impaired relaxation).  Right Ventricle: The right ventricular size is mildly enlarged. No increase in right ventricular wall thickness. Right ventricular systolic function is normal. There is normal pulmonary artery systolic pressure. The tricuspid regurgitant velocity is 2.71 m/s, and with an assumed right atrial pressure of 3 mmHg, the estimated right ventricular systolic pressure is 32.4 mmHg.  Left Atrium: Left atrial size was normal in size.  Right Atrium: Right atrial size was normal in size.  Pericardium: There is no evidence of pericardial effusion.  Mitral Valve: The mitral valve is grossly normal. Mild mitral valve regurgitation. No evidence of mitral valve stenosis.  Tricuspid Valve: The tricuspid valve is grossly normal. Tricuspid valve regurgitation is mild . No evidence of tricuspid stenosis.  Aortic Valve: The aortic valve is tricuspid. Aortic valve regurgitation is not visualized. No aortic stenosis is present.  Pulmonic Valve: The pulmonic valve was grossly normal. Pulmonic valve regurgitation is trivial. No evidence of pulmonic stenosis.  Aorta: The aortic root and ascending aorta are structurally normal, with no evidence of dilitation.  Venous: The inferior vena cava is normal in size with  greater than 50% respiratory variability, suggesting right atrial pressure of 3 mmHg.  IAS/Shunts: The atrial septum is grossly normal.  Additional Comments: A device lead is visualized in the right atrium and right ventricle.   LEFT VENTRICLE PLAX 2D LVIDd:         5.50 cm   Diastology LVIDs:         2.90 cm   LV e' medial:    6.64 cm/s LV PW:         0.70 cm   LV E/e' medial:  11.1 LV IVS:        0.80 cm   LV e' lateral:   6.96 cm/s LVOT diam:     2.00 cm   LV E/e' lateral: 10.6 LV SV:         59 LV SV Index:   33 LVOT Area:     3.14 cm  3D Volume EF: 3D EF:        61 % LV EDV:  83 ml LV ESV:       33 ml LV SV:        50 ml  RIGHT VENTRICLE RV Basal diam:  4.30 cm RV Mid diam:    3.70 cm RV S prime:     13.30 cm/s TAPSE (M-mode): 2.8 cm RVSP:           32.4 mmHg  LEFT ATRIUM             Index        RIGHT ATRIUM           Index LA diam:        3.40 cm 1.91 cm/m   RA Pressure: 3.00 mmHg LA Vol (A2C):   40.5 ml 22.75 ml/m  RA Area:     18.65 cm LA Vol (A4C):   34.4 ml 19.32 ml/m  RA Volume:   57.80 ml  32.47 ml/m LA Biplane Vol: 37.8 ml 21.23 ml/m AORTIC VALVE LVOT Vmax:   79.70 cm/s LVOT Vmean:  50.550 cm/s LVOT VTI:    0.189 m  AORTA Ao Root diam: 2.60 cm Ao Asc diam:  3.60 cm  MITRAL VALVE               TRICUSPID VALVE MV Area (PHT)  cm         TR Peak grad:   29.4 mmHg MV Decel Time: 243 msec    TR Vmax:        271.00 cm/s MV E velocity: 73.90 cm/s  Estimated RAP:  3.00 mmHg MV A velocity: 92.25 cm/s  RVSP:           32.4 mmHg MV E/A ratio:  0.80 SHUNTS Systemic VTI:  0.19 m Systemic Diam: 2.00 cm  Darryle Decent MD Electronically signed by Darryle Decent MD Signature Date/Time: 10/14/2022/1:25:35 PM    Final        CARDIAC MRI  MR CARDIAC MORPHOLOGY W WO CONTRAST 02/16/2011  Narrative *RADIOLOGY REPORT*  Clinical Data: Complete heart block, assess for cardiomyopathy  MR CARDIA MORPHOLOGY WITHOUT AND WITH CONTRAST  GE 1.5 T  magnet with dedicated cardiac coil.  FIESTA sequences for function and morphology.  10 minutes after the patient received 20 mL Multihance  contrast, inversion recovery sequences were done to assess for myocardial delayed enhancement.  EF was calculated on a dedicated workstation.  Contrast: 20mL MULTIHANCE  GADOBENATE DIMEGLUMINE  529 MG/ML IV SOLN  Comparison: None.  Findings: Normal left ventricular size and systolic function, EF 68%.  No regional wall motion abnormalities.  Normal wall thickness.  Normal right ventricular size and systolic function. Normal atrial sizes.  No significant aortic stenosis or regurgitation.  Though flow sequences were not done to quantify, there did not appear to be significant mital regurgitation.  On delayed enhancement imaging, there was no myocardial delayed enhancement.  Measurements:  LV EDV 152 mL  LV SV 103 mL  LV EF 68%  IMPRESSION: 1. Normal LV size and systolic function, EF 68%.  2. Normal RV size and systolic function.  3. No myocardial delayed enhancement, so no definitive evidence for prior MI, infiltrative disease, or myocarditis.  Original Report Authenticated By: [id number]   ______________________________________________________________________________________________      Risk Assessment/Calculations           Physical Exam VS:  BP 132/82   Pulse 61   Wt 161 lb (73 kg)   SpO2 98%   BMI 27.21 kg/m        Wt Readings from Last  3 Encounters:  12/22/23 161 lb (73 kg)  11/22/23 160 lb 3.2 oz (72.7 kg)  10/08/23 160 lb (72.6 kg)    GEN: Well nourished, well developed in no acute distress NECK: No JVD; No carotid bruits CARDIAC: RRR, no murmurs, rubs, gallops RESPIRATORY:  Clear to auscultation without rales, wheezing or rhonchi  ABDOMEN: Soft, non-tender, non-distended EXTREMITIES:  No edema; No deformity   ASSESSMENT AND PLAN  Dyspnea on exertion: Symptom has been going on for 2 weeks, recent blood work  showed no significant sign of anemia, normal kidney function and electrolyte.  Normal TSH.  OptiVol device interrogation shows her volume status is normal.  On exam, she also appears to be euvolemic.  With her persistent dyspnea with exertion, I am concerned of anginal equivalent. I recommended echocardiogram and a coronary CTA to make sure she does not have significant ischemic disease.  Nonischemic cardiomyopathy: She has a prior history of presumed nonischemic cardiomyopathy, EF dropped down to 30 to 35% in 2017 however later normalized.  She did not undergo ischemic workup previously, however given the recent dyspnea on exertion, I would recommend a coronary CTA and echocardiogram  Chronic systolic heart failure: EF normalized last echocardiogram.  Patient appears to be euvolemic on exam.  She is on Entresto , spironolactone  and carvedilol .  Hypertension: Blood pressure well-controlled  History of CRT-D: Followed by EP service.  Recent device interrogation shows no sign of fluid overload.        Dispo: Follow-up with Dr. Pietro in January or February 2026 if echocardiogram and stress test are normal.  Earlier if any of the test come back positive  Signed, Scot Ford, [place]

## 2023-12-22 NOTE — Progress Notes (Signed)
 Remote ICD Transmission

## 2023-12-22 NOTE — Patient Instructions (Signed)
 Medication Instructions:   Your physician recommends that you continue on your current medications as directed. Please refer to the Current Medication list given to you today.  *If you need a refill on your cardiac medications before your next appointment, please call your pharmacy*   Testing/Procedures:  Your physician has requested that you have an echocardiogram. Echocardiography is a painless test that uses sound waves to create images of your heart. It provides your doctor with information about the size and shape of your heart and how well your heart's chambers and valves are working. This procedure takes approximately one hour. There are no restrictions for this procedure. Please do NOT wear cologne, perfume, aftershave, or lotions (deodorant is allowed). Please arrive 15 minutes prior to your appointment time.  Please note: We ask at that you not bring children with you during ultrasound (echo/ vascular) testing. Due to room size and safety concerns, children are not allowed in the ultrasound rooms during exams. Our front office staff cannot provide observation of children in our lobby area while testing is being conducted. An adult accompanying a patient to their appointment will only be allowed in the ultrasound room at the discretion of the ultrasound technician under special circumstances. We apologize for any inconvenience.     Your cardiac CT will be scheduled at one of the below locations:     [facility] [address]  [place], [place] [address] [phone]    If scheduled at the Heart [facility] V[facility] at [facility] street, please enter the parking lot using the [address] entrance and use the FREE valet service at the patient drop-off area. Enter the building and check-in with registration on the main floor.    Please follow these instructions carefully (unless otherwise directed):  An IV will be required for this test and  Nitroglycerin will be given.  Hold all erectile dysfunction medications at least 3 days (72 hrs) prior to test. (Ie viagra, cialis, sildenafil, tadalafil, etc)   On the Night Before the Test: Be sure to Drink plenty of water. Do not consume any caffeinated/decaffeinated beverages or chocolate 12 hours prior to your test. Do not take any antihistamines 12 hours prior to your test.  On the Day of the Test: Drink plenty of water until 1 hour prior to the test. Do not eat any food 1 hour prior to test. You may take your regular medications prior to the test.  Take carvedilol  (25 mg) by mouth two hours prior to test. If you take Spironolactone   please HOLD on the morning of the test. Patients who wear a continuous glucose monitor MUST remove the device prior to scanning. FEMALES- please wear underwire-free bra if available, avoid dresses & tight clothing       After the Test: Drink plenty of water. After receiving IV contrast, you may experience a mild flushed feeling. This is normal. On occasion, you may experience a mild rash up to 24 hours after the test. This is not dangerous. If this occurs, you can take Benadryl 25 mg, Zyrtec, Claritin, or Allegra and increase your fluid intake. (Patients taking Tikosyn should avoid Benadryl, and may take Zyrtec, Claritin, or Allegra) If you experience trouble breathing, this can be serious. If it is severe call 911 IMMEDIATELY. If it is mild, please call our office.  We will call to schedule your test 2-4 weeks out understanding that some insurance companies will need an authorization prior to the service being performed.  For more information and frequently asked questions, please visit our website : [email or web address]  For non-scheduling related questions, please contact the cardiac imaging nurse navigator should you have any questions/concerns: Cardiac Imaging Nurse Navigators Direct Office Dial: [phone]   For scheduling  needs, including cancellations and rescheduling, please call [place], [phone].   Follow-Up:  IN [place] OR FEBRUARY 2026 WITH DR. CRENSHAW--   OTHER INSTRUCTIONS: WE WILL CALL YOU WITH YOUR CARDIAC CT RESULTS AND BRING YOU IN SOONER IF RESULTS DEEM SO

## 2023-12-23 ENCOUNTER — Encounter

## 2023-12-24 NOTE — Progress Notes (Signed)
 Monthly heart failure report shows overall stable HF diagnostics over the preceding 30 days with no changes to current medications needed.   Continue monthly reports with additional follow up as needed.   Ozell Jodie Passey, PA-C

## 2023-12-26 NOTE — Progress Notes (Signed)
 Monthly heart failure report shows overall stable HF diagnostics over the preceding 30 days with no changes to current medications needed.   Continue monthly reports with additional follow up as needed.   Ozell Jodie Passey, PA-C

## 2023-12-27 ENCOUNTER — Ambulatory Visit (INDEPENDENT_AMBULATORY_CARE_PROVIDER_SITE_OTHER): Admitting: Family Medicine

## 2023-12-27 ENCOUNTER — Encounter: Payer: Self-pay | Admitting: Family Medicine

## 2023-12-27 ENCOUNTER — Ambulatory Visit
Admission: RE | Admit: 2023-12-27 | Discharge: 2023-12-27 | Disposition: A | Source: Ambulatory Visit | Attending: Family Medicine | Admitting: Family Medicine

## 2023-12-27 VITALS — BP 110/60 | HR 60 | Temp 98.1°F | Wt 160.2 lb

## 2023-12-27 DIAGNOSIS — R0602 Shortness of breath: Secondary | ICD-10-CM | POA: Diagnosis not present

## 2023-12-27 DIAGNOSIS — Z95810 Presence of automatic (implantable) cardiac defibrillator: Secondary | ICD-10-CM | POA: Diagnosis not present

## 2023-12-27 DIAGNOSIS — I10 Essential (primary) hypertension: Secondary | ICD-10-CM | POA: Diagnosis not present

## 2023-12-27 DIAGNOSIS — I517 Cardiomegaly: Secondary | ICD-10-CM | POA: Diagnosis not present

## 2023-12-27 NOTE — Progress Notes (Signed)
   Subjective:    Patient ID: Joanne Bell, female    DOB: 09-22-1960, 63 y.o.   MRN: [id number]  HPI Here for 2 week of intermittent SOB on exertion. No chest pain or pressure. No nausea or sweats. This bothers her during the day but not the night. Her last labs on 11-22-23 revealed a mild anemia (Hgb was 11.3) which has been stable. She saw Cardiology on 12-22-23, and they have scheduled her to have an ECHO on 01-04-24 and a CT coronary artery morphology exam on 01-14-24. She has a hx of nonischemic cardiomyopathy, and her last ECHO on 04-24-23 showed an EF of 30-35%. Today she feels fine. No coughing or wheezing. No ankle swelling.    Review of Systems  Constitutional: Negative.   Respiratory:  Positive for shortness of breath. Negative for cough, chest tightness and wheezing.   Cardiovascular: Negative.        Objective:   Physical Exam Constitutional:      Appearance: Normal appearance. She is not ill-appearing.  Cardiovascular:     Rate and Rhythm: Normal rate and regular rhythm.     Pulses: Normal pulses.     Heart sounds: Normal heart sounds.  Pulmonary:     Effort: Pulmonary effort is normal.     Breath sounds: Normal breath sounds.  Musculoskeletal:     Right lower leg: No edema.     Left lower leg: No edema.  Neurological:     Mental Status: She is alert.           Assessment & Plan:  SOB on exertion. The entire etiology is not clear, but this may be from a combination of factors. We will send her for a CXR today. She will get the heart tests as above.  Garnette Olmsted, MD

## 2023-12-29 ENCOUNTER — Ambulatory Visit: Payer: Self-pay | Admitting: Family Medicine

## 2023-12-30 ENCOUNTER — Encounter (HOSPITAL_COMMUNITY): Payer: Self-pay

## 2023-12-30 ENCOUNTER — Telehealth: Payer: Self-pay | Admitting: Family Medicine

## 2023-12-30 DIAGNOSIS — R0602 Shortness of breath: Secondary | ICD-10-CM

## 2023-12-30 NOTE — Telephone Encounter (Signed)
 E2c2 agent is calling and pt would like chest xray result and she will let pt know md not in the office this afternoon and will be back tomorrow

## 2023-12-31 NOTE — Telephone Encounter (Signed)
 Spoke with pt aware that chest X-Ray results were normal with clear lungs, pt wants to know what should she do next.

## 2023-12-31 NOTE — Telephone Encounter (Signed)
 She has upcoming Cardiology tests, and I will also refer her to Pulmonary to look for any lung issues

## 2024-01-03 NOTE — Telephone Encounter (Signed)
 Left detailed message for pt with information regarding appointments to Cardiology and Pulmonology, advised pt to call the office with any questions

## 2024-01-03 NOTE — Progress Notes (Signed)
 Monthly heart failure report shows overall stable HF diagnostics over the preceding 30 days with no changes to current medications needed.   Continue monthly reports with additional follow up as needed.   Ozell Jodie Passey, PA-C

## 2024-01-04 ENCOUNTER — Ambulatory Visit (HOSPITAL_COMMUNITY)
Admission: RE | Admit: 2024-01-04 | Discharge: 2024-01-04 | Disposition: A | Discharge status: Home / Self Care | Source: Ambulatory Visit | Attending: Physician Assistant | Admitting: Physician Assistant

## 2024-01-04 DIAGNOSIS — I428 Other cardiomyopathies: Secondary | ICD-10-CM

## 2024-01-04 DIAGNOSIS — R0609 Other forms of dyspnea: Secondary | ICD-10-CM | POA: Insufficient documentation

## 2024-01-04 DIAGNOSIS — I5022 Chronic systolic (congestive) heart failure: Secondary | ICD-10-CM

## 2024-01-04 DIAGNOSIS — R079 Chest pain, unspecified: Secondary | ICD-10-CM | POA: Insufficient documentation

## 2024-01-04 DIAGNOSIS — Z950 Presence of cardiac pacemaker: Secondary | ICD-10-CM | POA: Diagnosis not present

## 2024-01-04 MED ORDER — NITROGLYCERIN 0.4 MG SL SUBL
0.8000 mg | SUBLINGUAL_TABLET | Freq: Once | SUBLINGUAL | Status: AC
  Administered 2024-01-04: 0.8 mg via SUBLINGUAL

## 2024-01-04 MED ORDER — IOHEXOL 350 MG/ML SOLN
100.0000 mL | Freq: Once | INTRAVENOUS | Status: AC | PRN
  Administered 2024-01-04: 100 mL via INTRAVENOUS

## 2024-01-06 ENCOUNTER — Ambulatory Visit: Payer: Self-pay | Admitting: Physician Assistant

## 2024-01-14 ENCOUNTER — Ambulatory Visit: Payer: Self-pay | Admitting: Internal Medicine

## 2024-01-14 ENCOUNTER — Other Ambulatory Visit (HOSPITAL_BASED_OUTPATIENT_CLINIC_OR_DEPARTMENT_OTHER)

## 2024-01-14 DIAGNOSIS — R0609 Other forms of dyspnea: Secondary | ICD-10-CM

## 2024-01-16 ENCOUNTER — Ambulatory Visit: Payer: Self-pay | Admitting: Student in an Organized Health Care Education/Training Program

## 2024-01-17 LAB — ECHOCARDIOGRAM COMPLETE
AR max vel: 1.6 cm2
AV Area VTI: 1.44 cm2
AV Area mean vel: 1.58 cm2
AV Mean grad: 3 mmHg
AV Peak grad: 6.5 mmHg
Ao pk vel: 1.27 m/s
Area-P 1/2: 2.01 cm2
S' Lateral: 2.63 cm

## 2024-01-18 NOTE — Progress Notes (Signed)
 Patient is concerned of sob, pt confirm no chest pain at this time. Sent message to [place] regarding patient concern. Awaiting  response.

## 2024-01-18 NOTE — Progress Notes (Signed)
 I have called and spoke with the Joanne Bell, her shortness is getting better over the past few weeks. It still persists, but better. She has been referred by her PCP for a pulmonary evaluation which I think is quite reasonable. I told [name] that I recommend keep current follow up with Dr. Pietro in January. All questions answered.

## 2024-01-19 ENCOUNTER — Encounter: Payer: Self-pay | Admitting: Pulmonary Disease

## 2024-01-19 ENCOUNTER — Ambulatory Visit: Admitting: Pulmonary Disease

## 2024-01-19 VITALS — BP 146/86 | HR 59 | Ht 64.0 in | Wt 159.0 lb

## 2024-01-19 DIAGNOSIS — R0609 Other forms of dyspnea: Secondary | ICD-10-CM | POA: Diagnosis not present

## 2024-01-19 NOTE — Patient Instructions (Signed)
 Nice to meet you  I ordered pulmonary function tests (PFT) to further evaluate the shortness of breath  No changes to medications  Return to clinic in 2-4 weeks after PFT or same day of PFT

## 2024-01-19 NOTE — Progress Notes (Signed)
 @Patient  ID: Joanne Bell, female    DOB: 1960/04/06, 63 y.o.   MRN: [id number]  Chief Complaint  Patient presents with   Consult    Pt states SOB x 1 increased in 12/2023     Referring provider: Johnny Garnette LABOR, MD  HPI:   63 y.o. woman whom are seen for evaluation of dyspnea on exertion.  Multiple prior cardiology notes reviewed.  Patient notes may have some mild symptoms, changes in breathing after pacemaker replacement earlier in the year.  However, marked change in symptoms starting October 2025.  Short of breath during the day.  No energy.  A lot of fatigue.  Present throughout the day.  Then in the evening symptoms with improved.  Be less short of breath etc.  Then symptoms with occur again in the morning the next day.  The symptoms prompted chest x-ray 12/2023 on my review interpretation reveals clear lungs bilaterally.  She underwent CT coronary scan 12/2023 to workup the symptoms that again showed clear lungs on limited views on my review and interpretation.  No significant coronary calcification etc. seen on review.  She underwent echocardiogram more recently 01/2024 that on my review revealed normal EF (historically low EF dating back to 2017 recovered over time), trivial MVR, trivial AI, normal left atrial size right atrial size, normal RV size and function, no comment on elevated PASP or RVSP.  No history of asthma.  She is a never smoker.  We discussed at length with clear parenchyma on chest x-ray and CT scan that underlying lungs are healthy.  No evidence for parenchymal lung disease to account for her symptoms.  Discussed that asthma is possible as asthma can be present with clear imaging.  We discussed role and rationale for empiric trial of inhalers to see if this helps her symptoms.  After shared decision making she declined this intervention.  We discussed pulmonary function test for additional information, investigation into pulmonary causes of her symptoms which she  agrees to move forward with.  Questionaires / Pulmonary Flowsheets:   ACT:      No data to display          MMRC:     No data to display          Epworth:      No data to display          Tests:   FENO:  No results found for: NITRICOXIDE  PFT:     No data to display          WALK:      No data to display          Imaging: Personally reviewed and as per EMR in discussion this note ECHOCARDIOGRAM COMPLETE Result Date: 01/17/2024    ECHOCARDIOGRAM REPORT   Patient Name:   Joanne Bell [facility] Date of Exam: 01/14/2024 Medical Rec #:  [id number]             Height:       64.5 in Accession #:    [id number]            Weight:       160.2 lb Date of Birth:  [birth year]             BSA:          1.790 m Patient Age:    63 years              BP:  110/60 mmHg Patient Gender: F                     HR:           60 bpm. Exam Location:  Outpatient Procedure: 2D Echo, 3D Echo, Cardiac Doppler, Color Doppler and Strain Analysis            (Both Spectral and Color Flow Doppler were utilized during            procedure). Indications:    R06.9 DOE  History:        Patient has prior history of Echocardiogram examinations, most                 recent 10/14/2022. Cardiomyopathy and CHF, Defibrillator,                 Arrythmias:Complete AV Block, Signs/Symptoms:Dyspnea; Risk                 Factors:Hypertension, Non-Smoker and Dyslipidemia. Patient                 denies chest pain and leg edema. She does have DOE.  Sonographer:    Annabella Cater RVT, RDCS (AE), RDMS Referring Phys: [phone] HAO MENG IMPRESSIONS  1. Left ventricular ejection fraction, by estimation, is 55 to 60%. Left ventricular ejection fraction by 3D volume is 57 %. The left ventricle has normal function. The left ventricle has no regional wall motion abnormalities. There is mild left ventricular hypertrophy. Left ventricular diastolic parameters are consistent with Grade I diastolic dysfunction (impaired  relaxation).  2. Right ventricular systolic function is normal. The right ventricular size is normal. There is normal pulmonary artery systolic pressure. The estimated right ventricular systolic pressure is 29.8 mmHg.  3. The mitral valve is grossly normal. Trivial mitral valve regurgitation. No evidence of mitral stenosis.  4. The aortic valve is tricuspid. Aortic valve regurgitation is trivial. No aortic stenosis is present.  5. The inferior vena cava is normal in size with greater than 50% respiratory variability, suggesting right atrial pressure of 3 mmHg. FINDINGS  Left Ventricle: Left ventricular ejection fraction, by estimation, is 55 to 60%. Left ventricular ejection fraction by 3D volume is 57 %. The left ventricle has normal function. The left ventricle has no regional wall motion abnormalities. Global longitudinal strain performed but not reported based on interpreter judgement due to suboptimal tracking. The left ventricular internal cavity size was normal in size. There is mild left ventricular hypertrophy. Abnormal (paradoxical) septal motion, consistent with RV pacemaker. Left ventricular diastolic parameters are consistent with Grade I diastolic dysfunction (impaired relaxation). Right Ventricle: The right ventricular size is normal. No increase in right ventricular wall thickness. Right ventricular systolic function is normal. There is normal pulmonary artery systolic pressure. The tricuspid regurgitant velocity is 2.59 m/s, and  with an assumed right atrial pressure of 3 mmHg, the estimated right ventricular systolic pressure is 29.8 mmHg. Left Atrium: Left atrial size was normal in size. Right Atrium: Right atrial size was normal in size. Pericardium: There is no evidence of pericardial effusion. Mitral Valve: The mitral valve is grossly normal. Trivial mitral valve regurgitation. No evidence of mitral valve stenosis. Tricuspid Valve: The tricuspid valve is grossly normal. Tricuspid valve  regurgitation is mild . No evidence of tricuspid stenosis. Aortic Valve: The aortic valve is tricuspid. Aortic valve regurgitation is trivial. No aortic stenosis is present. Aortic valve mean gradient measures 3.0 mmHg. Aortic valve peak gradient measures 6.5  mmHg. Aortic valve area, by VTI measures 1.44 cm. Pulmonic Valve: The pulmonic valve was normal in structure. Pulmonic valve regurgitation is trivial. No evidence of pulmonic stenosis. Aorta: The aortic root is normal in size and structure. Venous: The inferior vena cava is normal in size with greater than 50% respiratory variability, suggesting right atrial pressure of 3 mmHg. IAS/Shunts: The interatrial septum was not well visualized. Additional Comments: 3D was performed not requiring image post processing on an independent workstation and was normal. A device lead is visualized in the right atrium and right ventricle.  LEFT VENTRICLE PLAX 2D LVIDd:         4.59 cm         Diastology LVIDs:         2.63 cm         LV e' medial:    4.33 cm/s LV PW:         1.20 cm         LV E/e' medial:  11.6 LV IVS:        1.05 cm         LV e' lateral:   4.67 cm/s LVOT diam:     1.80 cm         LV E/e' lateral: 10.8 LV SV:         36 LV SV Index:   20 LVOT Area:     2.54 cm        3D Volume EF                                LV 3D EF:    Left                                             ventricul                                             ar                                             ejection                                             fraction                                             by 3D                                             volume is  57 %.                                 3D Volume EF:                                3D EF:        57 %                                LV EDV:       77 ml                                LV ESV:       33 ml                                LV SV:        44 ml RIGHT VENTRICLE RV S prime:      12.80 cm/s  PULMONARY VEINS TAPSE (M-mode): 2.4 cm      Diastolic Velocity: 32.40 cm/s                             S/D Velocity:       1.70                             Systolic Velocity:  54.40 cm/s LEFT ATRIUM           Index        RIGHT ATRIUM           Index LA diam:      3.00 cm 1.68 cm/m   RA Area:     11.70 cm LA Vol (A2C): 32.0 ml 17.88 ml/m  RA Volume:   30.30 ml  16.93 ml/m LA Vol (A4C): 44.8 ml 25.03 ml/m  AORTIC VALVE                    PULMONIC VALVE AV Area (Vmax):    1.60 cm     PV Vmax:          0.83 m/s AV Area (Vmean):   1.58 cm     PV Peak grad:     2.8 mmHg AV Area (VTI):     1.44 cm     PR End Diast Vel: 2.63 msec AV Vmax:           127.00 cm/s AV Vmean:          84.800 cm/s AV VTI:            0.247 m AV Peak Grad:      6.5 mmHg AV Mean Grad:      3.0 mmHg LVOT Vmax:         80.10 cm/s LVOT Vmean:        52.700 cm/s LVOT VTI:          0.140 m LVOT/AV VTI ratio: 0.57  AORTA Ao Root diam: 2.80 cm Ao Arch diam: 3.4 cm MITRAL VALVE               TRICUSPID VALVE MV  Area (PHT): 2.01 cm    TR Peak grad:   26.8 mmHg MV Decel Time: 378 msec    TR Vmax:        259.00 cm/s MV E velocity: 50.40 cm/s MV A velocity: 84.60 cm/s  SHUNTS MV E/A ratio:  0.60        Systemic VTI:  0.14 m                            Systemic Diam: 1.80 cm Soyla Merck MD Electronically signed by Soyla Merck MD Signature Date/Time: 01/17/2024/2:55:42 PM    Final    CT CORONARY MORPH W/CTA COR W/SCORE DEL W/CM &/OR WO/CM Addendum Date: 01/05/2024 ADDENDUM REPORT: 01/05/2024 22:40 EXAM: OVER-READ INTERPRETATION  CT CHEST The following report is an over-read performed by radiologist Dr. Franky Leff [facility] on 01/05/2024. This over-read does not include interpretation of cardiac or coronary anatomy or pathology. The coronary CTA interpretation by the cardiologist is attached. COMPARISON:  None. FINDINGS: Left chest wall pacer with pacer wires in the right heart and coronary sinus. Heart is normal  size. Aorta normal caliber. No adenopathy. No confluent opacities or effusions. No acute findings in the upper abdomen. Chest wall soft tissues are unremarkable. No acute bony abnormality. IMPRESSION: No acute extra cardiac abnormality. Electronically Signed   By: Franky Crease M.D.   On: 01/05/2024 22:40   Result Date: 01/05/2024 CLINICAL DATA:  Chest pain EXAM: Cardiac/Coronary CTA TECHNIQUE: A non-contrast, gated CT scan was obtained with axial slices of 3 mm through the heart for calcium scoring. Calcium scoring was performed using the Agatston method. A 120 kV prospective, gated, contrast cardiac scan was obtained. Gantry rotation speed was 250 msecs and collimation was 0.6 mm. Two sublingual nitroglycerin tablets (0.8 mg) were given. The 3D data set was reconstructed in 5% intervals of the 35-75% of the R-R cycle. Diastolic phases were analyzed on a dedicated workstation using MPR, MIP, and VRT modes. The patient received 95 cc of contrast. FINDINGS: Image quality: Fair.  Significant beam artifact from pacing wires Noise artifact is: Mild Coronary Arteries:  Normal coronary origin.  Right dominance. Left main: The left main is a large caliber vessel with a normal take off from the left coronary cusp that bifurcates to form a left anterior descending artery and a left circumflex artery. There is no plaque or stenosis. Left anterior descending artery: The LAD is patent without evidence of plaque or stenosis. The LAD gives off 2 patent diagonal branches. Left circumflex artery: The LCX is non-dominant and patent with no evidence of plaque or stenosis. The LCX gives off 2 patent obtuse marginal branches. Right coronary artery: The RCA is dominant with normal take off from the right coronary cusp. There is beam and noise artifact in the prox to mid RCA but no obvious calcified plaque. Cannot rule out soft plaque. The RCA terminates as a PDA and right posterolateral branch without evidence of plaque or stenosis.  Right Atrium: Right atrial size is within normal limits. Right Ventricle: The right ventricular cavity is within normal limits. Left Atrium: Left atrial size is normal in size with no left atrial appendage filling defect. Left Ventricle: The ventricular cavity size is within normal limits. Pulmonary arteries: Normal in size. Pulmonary veins: Normal pulmonary venous drainage. Pericardium: Normal thickness without significant effusion or calcium present. Cardiac valves: The aortic valve is trileaflet without significant calcification. The mitral valve is normal without significant  calcification. Aorta: Normal caliber without significant disease. Extra-cardiac findings: See attached radiology report for non-cardiac structures. IMPRESSION: 1. Coronary calcium score of 0 (distal LCx non diagnostic due to BiV lead in the coronary sinus). 2. Normal coronary origin with right dominance. 3. Normal coronary arteries. There is beam and noise artifact in the prox to mid RCA but no obvious calcified plaque. Cannot rule out soft plaque. If suspicion high for CAD, consider Stress PET CT to rule out ischemia in the RCA territory. 4. Consider non atherosclerotic causes of chest pain. RECOMMENDATIONS: 1. CAD-RADS 0: No evidence of CAD (0%). Consider non-atherosclerotic causes of chest pain. 2. CAD-RADS 1: Minimal non-obstructive CAD (0-24%). Consider non-atherosclerotic causes of chest pain. Consider preventive therapy and risk factor modification. 3. CAD-RADS 2: Mild non-obstructive CAD (25-49%). Consider non-atherosclerotic causes of chest pain. Consider preventive therapy and risk factor modification. 4. CAD-RADS 3: Moderate stenosis. Consider symptom-guided anti-ischemic pharmacotherapy as well as risk factor modification per guideline directed care. Additional analysis with CT FFR will be submitted. 5. CAD-RADS 4: Severe stenosis. (70-99% or > 50% left main). Cardiac catheterization or CT FFR is recommended. Consider  symptom-guided anti-ischemic pharmacotherapy as well as risk factor modification per guideline directed care. Invasive coronary angiography recommended with revascularization per published guideline statements. 6. CAD-RADS 5: Total coronary occlusion (100%). Consider cardiac catheterization or viability assessment. Consider symptom-guided anti-ischemic pharmacotherapy as well as risk factor modification per guideline directed care. 7. CAD-RADS N: Non-diagnostic study. Obstructive CAD can't be excluded. Alternative evaluation is recommended. Wilbert Bihari, MD Electronically Signed: By: Wilbert Bihari M.D. On: 01/05/2024 17:14   DG Chest 2 View Result Date: 12/29/2023 EXAM: 2 VIEW(S) XRAY OF THE CHEST 12/27/2023 04:32:10 PM COMPARISON: 04/03/2016 CLINICAL HISTORY: SOB. Sob on exertion x 2 wks, HTN. FINDINGS: LINES, TUBES AND DEVICES: Stable Left subclavian AICD (Automatic Implantable Cardioverter Defibrillator) with leads to right atrium and 2 distal leads overlying right ventricle. LUNGS AND PLEURA: Chronic Linear left midlung subsegmental atelectasis or scarring. No pulmonary edema. No pleural effusion. No pneumothorax. HEART AND MEDIASTINUM: Mild Cardiomegaly. No acute abnormality of the mediastinal silhouette. BONES AND SOFT TISSUES: No acute osseous abnormality. IMPRESSION: 1. No acute cardiopulmonary process. 2. Mild cardiomegaly. Electronically signed by: Katheleen Faes MD 12/29/2023 10:06 AM EDT RP Workstation: [id number]    Lab Results: Personally reviewed CBC    Component Value Date/Time   WBC 4.7 11/22/2023 1523   RBC 4.14 11/22/2023 1523   HGB 11.3 (L) 11/22/2023 1523   HGB 11.1 03/01/2023 0918   HCT 34.4 (L) 11/22/2023 1523   HCT 36.4 03/01/2023 0918   PLT 230.0 11/22/2023 1523   PLT 271 03/01/2023 0918   MCV 83.2 11/22/2023 1523   MCV 86 03/01/2023 0918   MCH 26.1 (L) 03/01/2023 0918   MCH 26.1 (L) 07/12/2015 0939   MCHC 32.7 11/22/2023 1523   RDW 13.9 11/22/2023 1523   RDW 13.1  03/01/2023 0918   LYMPHSABS 2.2 11/22/2023 1523   MONOABS 0.4 11/22/2023 1523   EOSABS 0.1 11/22/2023 1523   BASOSABS 0.0 11/22/2023 1523    BMET    Component Value Date/Time   NA 140 11/22/2023 1523   NA 141 10/08/2023 1615   K 3.8 11/22/2023 1523   CL 104 11/22/2023 1523   CO2 27 11/22/2023 1523   GLUCOSE 79 11/22/2023 1523   BUN 11 11/22/2023 1523   BUN 10 10/08/2023 1615   CREATININE 0.85 11/22/2023 1523   CREATININE 0.94 07/12/2015 0939   CALCIUM 9.4 11/22/2023 1523  GFRNONAA 64 12/16/2018 1652   GFRAA 74 12/16/2018 1652    BNP No results found for: BNP  ProBNP No results found for: PROBNP  Specialty Problems   None   Allergies  Allergen Reactions   Prednisone      Insomnia, headache and sweats   Acyclovir And Related Other (See Comments)    Reaction unknown to patient   Augmentin  [Amoxicillin -Pot Clavulanate] Nausea And Vomiting   Clonidine  Derivatives Other (See Comments)    Pt states it makes me irritable   Percocet [Oxycodone -Acetaminophen ] Other (See Comments)    Dizziness, sweating, abdominal discomfort    Immunization History  Administered Date(s) Administered   Influenza, Seasonal, Injecte, Preservative Fre 02/16/2023   Influenza,inj,Quad PF,6+ Mos 11/30/2014, 12/22/2018, 02/10/2021   Moderna Covid-19 Fall Seasonal Vaccine 54yrs & older 02/23/2023   PFIZER(Purple Top)SARS-COV-2 Vaccination 02/18/2020   Pfizer Covid-19 Vaccine Bivalent Booster 82yrs & up 01/19/2021   Tdap 05/20/2006    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   sacubitril -valsartan  (ENTRESTO ) 49-51 MG Take 1 tablet by mouth 2 (two) times daily.   spironolactone  (ALDACTONE ) 25 MG tablet Take 1 tablet (25 mg total) by mouth daily.   vitamin E  1000 UNIT capsule Take 1 capsule by mouth daily.   No facility-administered encounter medications on file as of 01/19/2024.     Review of Systems  Review of Systems  No chest pain exertion.  No orthopnea or PND.  Comprehensive review of systems otherwise negative. Physical Exam  BP (!) 146/86   Pulse (!) 59   Ht 5' 4 (1.626 m) Comment: per pt  Wt 159 lb (72.1 kg)   SpO2 100%   BMI 27.29 kg/m   Wt Readings from Last 5 Encounters:  01/19/24 159 lb (72.1 kg)  12/27/23 160 lb 3.2 oz (72.7 kg)  12/22/23 161 lb (73 kg)  11/22/23 160 lb 3.2 oz (72.7 kg)  10/08/23 160 lb (72.6 kg)    BMI Readings from Last 5 Encounters:  01/19/24 27.29 kg/m  12/27/23 27.07 kg/m  12/22/23 27.21 kg/m  11/22/23 27.07 kg/m  10/08/23 27.46 kg/m     Physical Exam General: Sitting in chair, no distress Eyes: EOMI, no icterus Neck: Supple, no JVP Pulmonary: Clear, normal work of breathing, good air excursion Cardiovascular: Warm, no edema, regular rate and rhythm Abdomen: Nondistended   Assessment & Plan:   Dyspnea on exertion: Present for a week or 2 in  October 2025.  Now improved out of the blue over the last few days.  Dyspnea seem to be present during daytime hours and improved by the evening time.  More energy and less short of breath in the evening night etc.  Strange pattern, hard to make heads or tails of  bed.  The intermittent nature does beg the question of asthma given the  inconsistent nature of symptoms.  Her lung images, chest x-ray and cross-sectional are clear, no signs or demonstration of parenchymal abnormality.  Seems like milder symptoms occurred earlier in the year after pacemaker replacement.  She is nearly 100% paced, chronotropic insufficiency could be contributing.  But she states she had no issues prior to this when she was nearly 100% paced at that time as well.  Lastly, she has diastolic dysfunction, history of reduced EF now recovered, trivial MVR, trivial AI, possibly all contributing with dynamic worsening in the setting of normal physiology of exercise.  PFT for further evaluation.   Return in about 3 weeks (around 02/09/2024) for f/u Dr. Annella, after PFT.   Donnice JONELLE Annella, MD 01/19/2024   This appointment required 45 minutes of patient care (this includes precharting, chart review, review of results, face-to-face care, etc.).

## 2024-01-20 ENCOUNTER — Encounter

## 2024-01-24 ENCOUNTER — Ambulatory Visit: Attending: Student in an Organized Health Care Education/Training Program

## 2024-01-24 DIAGNOSIS — Z95810 Presence of automatic (implantable) cardiac defibrillator: Secondary | ICD-10-CM

## 2024-01-24 DIAGNOSIS — I5022 Chronic systolic (congestive) heart failure: Secondary | ICD-10-CM

## 2024-01-26 ENCOUNTER — Telehealth: Payer: Self-pay

## 2024-01-26 NOTE — Telephone Encounter (Signed)
 Remote ICM transmission received.  Attempted call to patient regarding ICM remote transmission.  Left detailed message per DPR with ICM phone number to return call for any questions, concerns or fluid symptoms.

## 2024-01-26 NOTE — Progress Notes (Signed)
 EPIC Encounter for ICM Monitoring  Patient Name: Joanne Bell is a 63 y.o. female Date: 01/26/2024 Primary Care Physican: Johnny Garnette LABOR, MD Primary Cardiologist: Pietro Electrophysiologist: Almetta Pore Pacing:  98.0%    12/03/2022 Weight: 160 lbs          03/01/2023 Office Weight: 156 lbs    07/23/2023 Office Weight: 159 lbs    10/08/2023 Office Weight: 160 lbs                              Attempted call to patient and unable to reach.  Left detailed message per DPR regarding transmission.  Transmission results reviewed.    Since 12/20/2023 ICM Remote Transmission: Optivol thoracic impedance suggesting normal fluid levels.     Prescribed:  Spironolactone  25 mg take 1 tablet by mouth daily.  Pt reports 12/20/2023 she is not taking.     Recommendations:  Left voice mail with ICM number and encouraged to call if experiencing any fluid symptoms.   Follow-up plan: ICM clinic phone appointment on 03/06/2024.  91 day device clinic remote transmission 03/22/2024.     EP/Cardiology Office Visits:  Recall 07/17/2024 with Jodie Passey, PA.    03/21/2024 with Dr Pietro.    Copy of ICM check sent to Dr. Almetta.     Remote monitoring is medically necessary for Heart Failure Management.    Daily Thoracic Impedance ICM trend: 10/25/2023 through 01/24/2024.    12-14 Month Thoracic Impedance ICM trend:     [name], RN 01/26/2024 1:45 PM

## 2024-01-27 ENCOUNTER — Ambulatory Visit

## 2024-01-27 DIAGNOSIS — R0609 Other forms of dyspnea: Secondary | ICD-10-CM | POA: Diagnosis not present

## 2024-01-27 LAB — PULMONARY FUNCTION TEST
DL/VA % pred: 98 %
DL/VA: 4.13 ml/min/mmHg/L
DLCO unc % pred: 68 %
DLCO unc: 13.35 ml/min/mmHg
FEF 25-75 Pre: 2 L/s
FEF2575-%Pred-Pre: 94 %
FEV1-%Pred-Pre: 69 %
FEV1-Pre: 1.67 L
FEV1FVC-%Pred-Pre: 110 %
FEV6-%Pred-Pre: 64 %
FEV6-Pre: 1.97 L
FEV6FVC-%Pred-Pre: 104 %
FVC-%Pred-Pre: 62 %
FVC-Pre: 1.97 L
Pre FEV1/FVC ratio: 85 %
Pre FEV6/FVC Ratio: 100 %

## 2024-01-27 NOTE — Progress Notes (Signed)
 Spirometry/DLCO performed only. Pt asked not to do post FVC because she didnt want to take inhaler and did not do pleth due to claustrophobia.

## 2024-01-27 NOTE — Patient Instructions (Signed)
 Spirometry/DLCO performed today.

## 2024-01-31 ENCOUNTER — Encounter: Payer: Self-pay | Admitting: Family Medicine

## 2024-02-02 ENCOUNTER — Ambulatory Visit (INDEPENDENT_AMBULATORY_CARE_PROVIDER_SITE_OTHER)

## 2024-02-02 DIAGNOSIS — Z23 Encounter for immunization: Secondary | ICD-10-CM | POA: Diagnosis not present

## 2024-02-16 ENCOUNTER — Encounter: Payer: Self-pay | Admitting: Pulmonary Disease

## 2024-02-16 ENCOUNTER — Ambulatory Visit: Admitting: Pulmonary Disease

## 2024-02-16 VITALS — BP 165/96 | HR 60 | Temp 97.9°F | Ht 64.0 in | Wt 168.2 lb

## 2024-02-16 DIAGNOSIS — R0609 Other forms of dyspnea: Secondary | ICD-10-CM | POA: Diagnosis not present

## 2024-02-16 NOTE — Progress Notes (Unsigned)
 @Patient  ID: Joanne Bell, female    DOB: [birth year], 63 y.o.   MRN: [id number]  Chief Complaint  Patient presents with   Shortness of Breath    Breathing is doing well. SOB has improved.     Referring provider: Annella Donnice SAUNDERS, MD  HPI:   63 y.o. woman whom are seen for evaluation of dyspnea on exertion.  Multiple prior cardiology notes reviewed.   HPI at initial visit: Patient notes may have some mild symptoms, changes in breathing after pacemaker replacement earlier in the year.  However, marked change in symptoms starting October 2025.  Short of breath during the day.  No energy.  A lot of fatigue.  Present throughout the day.  Then in the evening symptoms with improved.  Be less short of breath etc.  Then symptoms with occur again in the morning the next day.  The symptoms prompted chest x-ray 12/2023 on my review interpretation reveals clear lungs bilaterally.  She underwent CT coronary scan 12/2023 to workup the symptoms that again showed clear lungs on limited views on my review and interpretation.  No significant coronary calcification etc. seen on review.  She underwent echocardiogram more recently 01/2024 that on my review revealed normal EF (historically low EF dating back to 2017 recovered over time), trivial MVR, trivial AI, normal left atrial size right atrial size, normal RV size and function, no comment on elevated PASP or RVSP.  No history of asthma.  She is a never smoker.  We discussed at length with clear parenchyma on chest x-ray and CT scan that underlying lungs are healthy.  No evidence for parenchymal lung disease to account for her symptoms.  Discussed that asthma is possible as asthma can be present with clear imaging.  We discussed role and rationale for empiric trial of inhalers to see if this helps her symptoms.  After shared decision making she declined this intervention.  We discussed pulmonary function test for additional information, investigation  into pulmonary causes of her symptoms which she agrees to move forward with.  Questionaires / Pulmonary Flowsheets:   ACT:      No data to display          MMRC:     No data to display          Epworth:      No data to display          Tests:   FENO:  No results found for: NITRICOXIDE  PFT:    Latest Ref Rng & Units 01/27/2024    3:49 PM  PFT Results  FVC-Pre L 1.97  P  FVC-Predicted Pre % 62  P  Pre FEV1/FVC % % 85  P  FEV1-Pre L 1.67  P  FEV1-Predicted Pre % 69  P  DLCO uncorrected ml/min/mmHg 13.35  P  DLCO UNC% % 68  P  DLVA Predicted % 98  P    P Preliminary result    WALK:      No data to display          Imaging: Personally reviewed and as per EMR in discussion this note No results found.   Lab Results: Personally reviewed CBC    Component Value Date/Time   WBC 4.7 11/22/2023 1523   RBC 4.14 11/22/2023 1523   HGB 11.3 (L) 11/22/2023 1523   HGB 11.1 03/01/2023 0918   HCT 34.4 (L) 11/22/2023 1523   HCT 36.4 03/01/2023 0918   PLT 230.0 11/22/2023 1523  PLT 271 03/01/2023 0918   MCV 83.2 11/22/2023 1523   MCV 86 03/01/2023 0918   MCH 26.1 (L) 03/01/2023 0918   MCH 26.1 (L) 07/12/2015 0939   MCHC 32.7 11/22/2023 1523   RDW 13.9 11/22/2023 1523   RDW 13.1 03/01/2023 0918   LYMPHSABS 2.2 11/22/2023 1523   MONOABS 0.4 11/22/2023 1523   EOSABS 0.1 11/22/2023 1523   BASOSABS 0.0 11/22/2023 1523    BMET    Component Value Date/Time   NA 140 11/22/2023 1523   NA 141 10/08/2023 1615   K 3.8 11/22/2023 1523   CL 104 11/22/2023 1523   CO2 27 11/22/2023 1523   GLUCOSE 79 11/22/2023 1523   BUN 11 11/22/2023 1523   BUN 10 10/08/2023 1615   CREATININE 0.85 11/22/2023 1523   CREATININE 0.94 07/12/2015 0939   CALCIUM 9.4 11/22/2023 1523   GFRNONAA 64 12/16/2018 1652   GFRAA 74 12/16/2018 1652    BNP No results found for: BNP  ProBNP No results found for: PROBNP  Specialty Problems   None   Allergies   Allergen Reactions   Prednisone      Insomnia, headache and sweats   Acyclovir And Related Other (See Comments)    Reaction unknown to patient   Augmentin  [Amoxicillin -Pot Clavulanate] Nausea And Vomiting   Clonidine  Derivatives Other (See Comments)    Pt states it makes me irritable   Percocet [Oxycodone -Acetaminophen ] Other (See Comments)    Dizziness, sweating, abdominal discomfort    Immunization History  Administered Date(s) Administered   INFLUENZA, HIGH DOSE SEASONAL PF 02/02/2024   Influenza, Seasonal, Injecte, Preservative Fre 02/16/2023   Influenza,inj,Quad PF,6+ Mos 11/30/2014, 12/22/2018, 02/10/2021   Moderna Covid-19 Fall Seasonal Vaccine 23yrs & older 02/23/2023   PFIZER(Purple Top)SARS-COV-2 Vaccination 02/18/2020   Pfizer Covid-19 Vaccine Bivalent Booster 15yrs & up 01/19/2021   Tdap 05/20/2006    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   sacubitril -valsartan  (ENTRESTO ) 49-51 MG Take 1 tablet by mouth 2 (two) times daily.   vitamin E  1000 UNIT capsule Take 1 capsule by mouth daily.   spironolactone  (ALDACTONE ) 25 MG tablet Take 1 tablet (25 mg total) by mouth daily. (Patient not taking: Reported on 02/16/2024)    No facility-administered encounter medications on file as of 02/16/2024.     Review of Systems  Review of Systems  No chest pain exertion.  No orthopnea or PND.  Comprehensive review of systems otherwise negative. Physical Exam  BP (!) 165/96 (BP Location: Right Arm, Patient Position: Sitting, Cuff Size: Normal)   Pulse 60   Temp 97.9 F (36.6 C) (Oral)   Ht 5' 4 (1.626 m)   Wt 168 lb 3.2 oz (76.3 kg)   SpO2 99%   BMI  28.87 kg/m   Wt Readings from Last 5 Encounters:  02/16/24 168 lb 3.2 oz (76.3 kg)  01/27/24 159 lb 12.8 oz (72.5 kg)  01/19/24 159 lb (72.1 kg)  12/27/23 160 lb 3.2 oz (72.7 kg)  12/22/23 161 lb (73 kg)    BMI Readings from Last 5 Encounters:  02/16/24 28.87 kg/m  01/27/24 27.43 kg/m  01/19/24 27.29 kg/m  12/27/23 27.07 kg/m  12/22/23 27.21 kg/m     Physical Exam General: Sitting in chair, no distress Eyes: EOMI, no icterus Neck: Supple, no JVP Pulmonary: Clear, normal work of breathing, good air excursion Cardiovascular: Warm, no edema, regular rate and rhythm Abdomen: Nondistended   Assessment & Plan:   Dyspnea on exertion: Present for a week or 2 in  October 2025.  Now improved out of the blue over the last few days.  Dyspnea seem to be present during daytime hours and improved by the evening time.  More energy and less short of breath in the evening night etc.  Strange pattern, hard to make heads or tails of bed.  The intermittent nature does beg the question of asthma given the inconsistent nature of symptoms.  Her lung images, chest x-ray and cross-sectional are clear, no signs or demonstration of parenchymal abnormality.  Seems like milder symptoms occurred earlier in the year after pacemaker replacement.  She is nearly 100% paced, chronotropic insufficiency could be contributing.  But she states she had no issues prior to this when she was nearly 100% paced at that time as well.  Lastly, she has diastolic dysfunction, history of reduced  EF now recovered, trivial MVR, trivial AI, possibly all contributing with dynamic worsening in the setting of normal physiology of exercise.  PFT for further evaluation.   No follow-ups on file.   Donnice JONELLE Beals, MD 02/16/2024   This appointment required 45 minutes of patient care (this includes precharting, chart review, review of results, face-to-face care, etc.).

## 2024-02-18 ENCOUNTER — Encounter: Payer: Self-pay | Admitting: Pulmonary Disease

## 2024-02-24 ENCOUNTER — Other Ambulatory Visit

## 2024-03-06 ENCOUNTER — Ambulatory Visit: Attending: Student in an Organized Health Care Education/Training Program

## 2024-03-06 DIAGNOSIS — I5022 Chronic systolic (congestive) heart failure: Secondary | ICD-10-CM

## 2024-03-06 DIAGNOSIS — Z95810 Presence of automatic (implantable) cardiac defibrillator: Secondary | ICD-10-CM

## 2024-03-08 NOTE — Progress Notes (Signed)
 EPIC Encounter for ICM Monitoring  Patient Name: Joanne Bell is a 63 y.o. female Date: 03/08/2024 Primary Care Physican: Johnny Garnette LABOR, MD Primary Cardiologist: Pietro Electrophysiologist: Almetta Pore Pacing:  99.8%    12/03/2022 Weight: 160 lbs          03/01/2023 Office Weight: 156 lbs    07/23/2023 Office Weight: 159 lbs    10/08/2023 Office Weight: 160 lbs                              Transmission results reviewed.    Since 01/24/2024 ICM Remote Transmission: Optivol thoracic impedance suggesting normal fluid levels with the exception of possible fluid accumulation from 02/13/2024-02/17/2024.   Prescribed:  Spironolactone  25 mg take 1 tablet by mouth daily.  Pt reports 12/20/2023 she is not taking.     Recommendations:   No changes.   Follow-up plan: ICM clinic phone appointment on 04/10/2024.  91 day device clinic remote transmission 03/22/2024.     EP/Cardiology Office Visits:  Recall 07/17/2024 with Jodie Passey, PA.    03/21/2024 with Dr Pietro.    Copy of ICM check sent to Dr. Almetta.      Remote monitoring is medically necessary for Heart Failure Management.    Daily Thoracic Impedance ICM trend: 12/06/2023 through 03/06/2024.    12-14 Month Thoracic Impedance ICM trend:     [name], RN 03/08/2024 9:13 AM

## 2024-03-15 NOTE — Progress Notes (Signed)
 "    HPI: Follow-up nonischemic cardiomyopathy, hypertension and history of pacemaker for complete heart block. Previously followed by Dr. Fernande. Cardiac MRI December 2012 showed ejection fraction 68% and no infiltrative disease noted. Had pacemaker placed for complete heart block and was ultimately upgraded to CRT D due to worsening LV function.  Coronary CTA October 2025 showed calcium score 0, normal coronary arteries though study suboptimal.  Echocardiogram November 2025 showed normal LV function, mild left ventricular hypertrophy, grade 1 diastolic dysfunction, trace aortic insufficiency.  Since last seen she denies dyspnea, chest pain, palpitations or syncope.  Her blood pressure has been running high at home.  Current Outpatient Medications  Medication Sig Dispense Refill   carvedilol  (COREG ) 25 MG tablet TAKE 1 TABLET BY MOUTH TWICE DAILY WITH MEALS 180 tablet 3   omeprazole  (PRILOSEC) 40 MG capsule Take 1 capsule (40 mg total) by mouth daily. 90 capsule 3   sacubitril -valsartan  (ENTRESTO ) 49-51 MG Take 1 tablet by mouth 2 (two) times daily. 28 tablet 5   spironolactone  (ALDACTONE ) 25 MG tablet Take 1 tablet (25 mg total) by mouth daily. (Patient not taking: Reported on 02/16/2024) 90 tablet 2   vitamin E  1000 UNIT capsule Take 1 capsule by mouth daily.     No current facility-administered medications for this visit.     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 Rosalie, adenomatous polyps, repeat in 5 yrs   ENDOMETRIAL ABLATION  03/09/2002   EP IMPLANTABLE  DEVICE N/A 07/19/2015   Procedure: Upgrade to BIV ICD ;  Surgeon: Elspeth JAYSON Fernande, MD;  Location: [facility] INVASIVE CV LAB;  Service: Cardiovascular;  Laterality: N/A;   ICD GENERATOR CHANGEOUT N/A 03/23/2023   Procedure: ICD GENERATOR CHANGEOUT;  Surgeon: Fernande Elspeth JAYSON, MD;  Location: [facility] INVASIVE CV LAB;  Service: Cardiovascular;  Laterality: N/A;   MYOMECTOMY  03/09/1996   per Dr. Rutherford   PERMANENT PACEMAKER INSERTION Left 02/16/2011   Procedure: PERMANENT PACEMAKER INSERTION;  Surgeon: Elspeth JAYSON Fernande, MD;  Location: [facility] CATH LAB;  Service: Cardiovascular;  Laterality: Left;    Social History   Socioeconomic History   Marital status: Married    Spouse name: Not on file   Number of children: 2   Years of education: Not on file   Highest education level: Some college, no degree  Occupational History    Employer: [employer]    Comment: Diplomatic Services Operational Officer  Tobacco Use   Smoking status: Never   Smokeless tobacco: Never  Substance and Sexual Activity   Alcohol use: No    Alcohol/week: 0.0 standard drinks of alcohol   Drug use: No   Sexual activity: Not on file  Other Topics Concern   Not on file  Social History Narrative   Not on file   Social Drivers of Health   Tobacco Use: Low Risk (02/18/2024)   Patient History    Smoking Tobacco Use: Never    Smokeless Tobacco Use: Never    Passive Exposure: Not on file  Financial Resource Strain: Low Risk (12/25/2023)   Overall  Financial Resource Strain (CARDIA)    Difficulty of Paying Living Expenses: Not hard at all  Recent Concern: Financial Resource Strain - Medium Risk (11/22/2023)   Overall Financial Resource Strain (CARDIA)    Difficulty of Paying Living Expenses: Somewhat hard  Food Insecurity: No Food Insecurity (12/25/2023)   Epic    Worried About Programme Researcher, Broadcasting/film/video in the Last Year: Never true    Ran Out of Food in the Last Year: Never true  Recent Concern: Food Insecurity - Food Insecurity Present (11/22/2023)   Epic    Worried About  Programme Researcher, Broadcasting/film/video in the Last Year: Sometimes true    [employer] of Food in the Last Year: Never true  Transportation Needs: No Transportation Needs (12/25/2023)   Epic    Lack of Transportation (Medical): No    Lack of Transportation (Non-Medical): No  Physical Activity: Insufficiently Active (12/25/2023)   Exercise Vital Sign    Days of Exercise per Week: 2 days    Minutes of Exercise per Session: 30 min  Stress: No Stress Concern Present (12/25/2023)   [employer] of Occupational Health - Occupational Stress Questionnaire    Feeling of Stress: Not at all  Social Connections: Socially Integrated (12/25/2023)   Social Connection and Isolation Panel    Frequency of Communication with Friends and Family: More than three times a week    Frequency of Social Gatherings with Friends and Family: Once a week    Attends Religious Services: More than 4 times per year    Active Member of [employer] or Organizations: Yes    Attends Banker Meetings: 1 to 4 times per year    Marital Status: Married  Catering Manager Violence: Not on file  Depression (PHQ2-9): Low Risk (04/11/2021)   Depression (PHQ2-9)    PHQ-2 Score: 1  Alcohol Screen: Low Risk (12/25/2023)   Alcohol Screen    Last Alcohol Screening Score (AUDIT): 1  Housing: Low Risk (12/25/2023)   Epic    Unable to Pay for Housing in the Last Year: No    Number of Times Moved in the Last Year: 0    Homeless in the Last Year: No  Recent Concern: Housing - High Risk (11/22/2023)   Epic    Unable to Pay for Housing in the Last Year: Yes    Number of Times Moved in the Last Year: 0    Homeless in the Last Year: No  Utilities: Not on file  Health Literacy: Not on file    Family History  Problem Relation Age of Onset   Depression Mother    Arthritis Father    Diabetes Father    Hyperlipidemia Father    Hypertension Father    Stroke Father     ROS: no fevers or chills, productive cough, hemoptysis, dysphasia,  odynophagia, melena, hematochezia, dysuria, hematuria, rash, seizure activity, orthopnea, PND, pedal edema, claudication. Remaining systems are negative.  Physical Exam: Well-developed well-nourished in no acute distress.  Skin is warm and dry.  HEENT is normal.  Neck is supple.  Chest is clear to auscultation with normal expansion.  Cardiovascular exam is regular rate and rhythm.  Abdominal exam nontender or distended. No masses palpated. Extremities show no edema. neuro grossly intact   A/P  1 chronic diastolic congestive heart failure-patient remains euvolemic on examination.  Continue diuretic at present dose.  2 history of nonischemic cardiomyopathy-previous CTA showed no coronary disease.  LV function improved on most recent echocardiogram after CRT-D.  Continue Entresto  and carvedilol .  3 hypertension-patient's blood pressure is elevated.  She also states it is elevated at home.  Will increase Entresto  to 97/103 twice daily and follow.  Check potassium and renal function in 1 week.  4 CRT-D-per EP.  Redell Shallow, MD    "

## 2024-03-20 ENCOUNTER — Telehealth: Payer: Self-pay | Admitting: *Deleted

## 2024-03-20 NOTE — Telephone Encounter (Signed)
 Noted

## 2024-03-20 NOTE — Telephone Encounter (Signed)
 Copied from CRM #[id number]. Topic: Clinical - Medication Question >> Mar 17, 2024  9:53 AM Harlene ORN wrote: Reason for CRM: Patient called. Stuffiness on the right side of her head. soreness in the bridge of her nose. Coughing. Started Tuesday. [place] out of [place]. Would like to be prescribed some medication for treatment, [facility] [address] in [place], [place] . Please call back the patient if any questions. >> Mar 20, 2024 12:05 PM Aisha D wrote: Pt is calling back to get an update on this request. Pt stated that she is still having the same issue and is now back in the state. Pt would like a callback today if possible with an update.

## 2024-03-20 NOTE — Telephone Encounter (Signed)
 Left detailed message for pt advised that Dr Johnny want to have a VV with pt tomorrow, few open slots available, advised pt to call the office back to schedule

## 2024-03-20 NOTE — Telephone Encounter (Signed)
 Patient return call and has been scheduled for a VV tomorrow at 8:45 am.

## 2024-03-21 ENCOUNTER — Encounter: Payer: Self-pay | Admitting: Family Medicine

## 2024-03-21 ENCOUNTER — Telehealth: Admitting: Family Medicine

## 2024-03-21 ENCOUNTER — Encounter: Payer: Self-pay | Admitting: Cardiology

## 2024-03-21 ENCOUNTER — Ambulatory Visit: Attending: Cardiology | Admitting: Cardiology

## 2024-03-21 VITALS — BP 160/80 | HR 59 | Ht 64.0 in | Wt 157.9 lb

## 2024-03-21 DIAGNOSIS — I10 Essential (primary) hypertension: Secondary | ICD-10-CM

## 2024-03-21 DIAGNOSIS — J3089 Other allergic rhinitis: Secondary | ICD-10-CM

## 2024-03-21 DIAGNOSIS — I428 Other cardiomyopathies: Secondary | ICD-10-CM

## 2024-03-21 DIAGNOSIS — I5032 Chronic diastolic (congestive) heart failure: Secondary | ICD-10-CM

## 2024-03-21 MED ORDER — METHYLPREDNISOLONE 4 MG PO TBPK: ORAL_TABLET | ORAL | 0 refills | Status: AC

## 2024-03-21 MED ORDER — SACUBITRIL-VALSARTAN 97-103 MG PO TABS: 1.0000 | ORAL_TABLET | Freq: Two times a day (BID) | ORAL | 3 refills | Status: AC

## 2024-03-21 NOTE — Patient Instructions (Addendum)
 Medication Instructions:  Increase Entresto  to 97/103 mg twice a day Continue all other medications *If you need a refill on your cardiac medications before your next appointment, please call your pharmacy*  Lab Work: Bmet in 1 week If you have labs (blood work) drawn today and your tests are completely normal, you will receive your results only by: MyChart Message (if you have MyChart) OR A paper copy in the mail If you have any lab test that is abnormal or we need to change your treatment, we will call you to review the results.  Testing/Procedures: None ordered  Follow-Up: At [facility], you and your health needs are our priority.  As part of our continuing mission to provide you with exceptional heart care, our providers are all part of one team.  This team includes your primary Cardiologist (physician) and Advanced Practice Providers or APPs (Physician Assistants and Nurse Practitioners) who all work together to provide you with the care you need, when you need it.  Your next appointment:  6 months    Call in April to schedule July appointment     Provider:  Unice   We recommend signing up for the patient portal called MyChart.  Sign up information is provided on this After Visit Summary.  MyChart is used to connect with patients for Virtual Visits (Telemedicine).  Patients are able to view lab/test results, encounter notes, upcoming appointments, etc.  Non-urgent messages can be sent to your provider as well.   To learn more about what you can do with MyChart, go to [email or web address].

## 2024-03-21 NOTE — Progress Notes (Signed)
 "  Subjective:    Patient ID: Joanne Bell, female    DOB: 1961-02-04, 64 y.o.   MRN: [id number]  HPI Virtual Visit via Video Note  I connected with the patient on 03/21/2024 at  8:45 AM EST by a video enabled telemedicine application and verified that I am speaking with the correct person using two identifiers.  Location patient: home Location provider:work or home office Persons participating in the virtual visit: patient, provider  I discussed the limitations of evaluation and management by telemedicine and the availability of in person appointments. The patient expressed understanding and agreed to proceed.   HPI: Here for one week of stuffiness in her head and PND. This started about the time she returned from a trip to [place] . She denies any headache or fever or ST or cough. She has tried Mucinex  and Benadryl with mixed results.    ROS: See pertinent positives and negatives per HPI.  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 Rosalie, adenomatous polyps, repeat in 5 yrs   ENDOMETRIAL ABLATION  03/09/2002   EP IMPLANTABLE DEVICE N/A 07/19/2015   Procedure: Upgrade to BIV ICD ;  Surgeon: Elspeth JAYSON Sage, MD;  Location: [facility] INVASIVE CV LAB;  Service: Cardiovascular;  Laterality: N/A;   ICD GENERATOR CHANGEOUT N/A 03/23/2023   Procedure: ICD GENERATOR CHANGEOUT;  Surgeon: Sage Elspeth JAYSON, MD;  Location: [facility] INVASIVE CV LAB;  Service: Cardiovascular;  Laterality: N/A;   MYOMECTOMY  03/09/1996   per Dr. Rutherford   PERMANENT PACEMAKER INSERTION Left  02/16/2011   Procedure: PERMANENT PACEMAKER INSERTION;  Surgeon: Elspeth JAYSON Sage, MD;  Location: [facility] CATH LAB;  Service: Cardiovascular;  Laterality: Left;    Family History  Problem Relation Age of Onset   Depression Mother    Arthritis Father    Diabetes Father    Hyperlipidemia Father    Hypertension Father    Stroke Father     Current Medications[1]  EXAM:  VITALS per patient if applicable:  GENERAL: alert, oriented, appears well and in no acute distress  HEENT: atraumatic, conjunttiva clear, no obvious abnormalities on inspection of external nose and ears  NECK: normal movements of the head and neck  LUNGS: on inspection no signs of respiratory distress, breathing rate appears normal, no obvious gross SOB, gasping or wheezing  CV: no obvious cyanosis  MS: moves all visible extremities without noticeable abnormality  PSYCH/NEURO: pleasant and cooperative, no obvious depression or anxiety, speech and thought processing grossly intact  ASSESSMENT AND PLAN: This sounds allergic in nature. We will treat with a Medrol  dose pack.  Garnette Olmsted, MD  Discussed the following assessment and plan:  No diagnosis found.     I discussed the assessment and treatment plan with the patient. The patient was provided an opportunity to ask questions and all were answered. The patient agreed with the plan and demonstrated an understanding of the instructions.   The patient was advised to call back or seek an in-person evaluation if the symptoms worsen or if the condition fails to improve  as anticipated.      Review of Systems     Objective:   Physical Exam        Assessment & Plan:       [1]  Current Outpatient Medications:    carvedilol  (COREG ) 25 MG tablet, TAKE 1 TABLET BY MOUTH TWICE DAILY WITH MEALS, Disp: 180 tablet, Rfl: 3   omeprazole  (PRILOSEC) 40 MG capsule, Take 1 capsule (40 mg total) by mouth daily., Disp: 90 capsule, Rfl: 3   sacubitril -valsartan   (ENTRESTO ) 49-51 MG, Take 1 tablet by mouth 2 (two) times daily., Disp: 28 tablet, Rfl: 5   spironolactone  (ALDACTONE ) 25 MG tablet, Take 1 tablet (25 mg total) by mouth daily., Disp: 90 tablet, Rfl: 2   vitamin E  1000 UNIT capsule, Take 1 capsule by mouth daily., Disp: , Rfl:   "

## 2024-03-22 ENCOUNTER — Ambulatory Visit: Payer: Commercial Managed Care - HMO

## 2024-03-22 DIAGNOSIS — I428 Other cardiomyopathies: Secondary | ICD-10-CM

## 2024-03-23 LAB — CUP PACEART REMOTE DEVICE CHECK
Battery Remaining Longevity: 99 mo
Battery Voltage: 3 V
Brady Statistic RV Percent Paced: 99.83 %
Date Time Interrogation Session: 20260114020056
HighPow Impedance: 57 Ohm
Implantable Lead Connection Status: 753985
Implantable Lead Connection Status: 753985
Implantable Lead Connection Status: 753985
Implantable Lead Implant Date: 20121210
Implantable Lead Implant Date: 20170512
Implantable Lead Implant Date: 20170512
Implantable Lead Location: 753858
Implantable Lead Location: 753859
Implantable Lead Location: 753860
Implantable Lead Model: 4598
Implantable Pulse Generator Implant Date: 20250114
Lead Channel Impedance Value: 361 Ohm
Lead Channel Impedance Value: 380 Ohm
Lead Channel Impedance Value: 380 Ohm
Lead Channel Impedance Value: 418 Ohm
Lead Channel Impedance Value: 418 Ohm
Lead Channel Impedance Value: 456 Ohm
Lead Channel Impedance Value: 532 Ohm
Lead Channel Impedance Value: 646 Ohm
Lead Channel Impedance Value: 665 Ohm
Lead Channel Impedance Value: 665 Ohm
Lead Channel Impedance Value: 893 Ohm
Lead Channel Impedance Value: 912 Ohm
Lead Channel Impedance Value: 950 Ohm
Lead Channel Pacing Threshold Amplitude: 0.5 V
Lead Channel Pacing Threshold Amplitude: 0.75 V
Lead Channel Pacing Threshold Amplitude: 2.125 V
Lead Channel Pacing Threshold Pulse Width: 0.4 ms
Lead Channel Pacing Threshold Pulse Width: 0.4 ms
Lead Channel Pacing Threshold Pulse Width: 0.4 ms
Lead Channel Sensing Intrinsic Amplitude: 2.8 mV
Lead Channel Setting Pacing Amplitude: 1.5 V
Lead Channel Setting Pacing Amplitude: 2 V
Lead Channel Setting Pacing Amplitude: 2.75 V
Lead Channel Setting Pacing Pulse Width: 0.4 ms
Lead Channel Setting Pacing Pulse Width: 0.4 ms
Lead Channel Setting Sensing Sensitivity: 0.3 mV
Zone Setting Status: 755011
Zone Setting Status: 755011
Zone Setting Status: 755011

## 2024-03-24 ENCOUNTER — Telehealth: Payer: Self-pay

## 2024-03-24 ENCOUNTER — Ambulatory Visit: Payer: Self-pay | Admitting: Family Medicine

## 2024-03-24 MED ORDER — HYDROCODONE BIT-HOMATROP MBR 5-1.5 MG/5ML PO SOLN: 5.0000 mL | ORAL | 0 refills | Status: AC | PRN

## 2024-03-24 NOTE — Telephone Encounter (Signed)
 Agent will let pt know waiting on md

## 2024-03-24 NOTE — Telephone Encounter (Signed)
 Copied from CRM [id number]. Topic: Clinical - Red Word Triage >> Mar 24, 2024  8:11 AM Adelita E wrote: [facility] that prompted transfer to Nurse Triage: Worsening cough while patient is on antibiotics. Patient stated she cannot sleep at night and is coughing up clear phlegm.   Please warm transfer Red Word call to Nurse Triage and then click Send Message and Close CRM. >> Mar 24, 2024 12:23 PM Montie POUR wrote: I let Ms. Sheaffer know that they are working on this medication.  >> Mar 24, 2024  8:13 AM Adelita E wrote: Reason for Triage: Worsening cough while patient is on antibiotics. Patient stated she cannot sleep at night and is coughing up clear phlegm.

## 2024-03-24 NOTE — Telephone Encounter (Signed)
 Done

## 2024-03-24 NOTE — Addendum Note (Signed)
 Addended by: JOHNNY SENIOR A on: 03/24/2024 03:41 PM   Modules accepted: Orders

## 2024-03-24 NOTE — Telephone Encounter (Signed)
 FYI Only or Action Required?: Action required by provider: Requesting cough syrup.  Patient was last seen in primary care on 03/21/2024 by Johnny Garnette LABOR, MD.  Called Nurse Triage reporting Cough.  Symptoms began several days ago.  Interventions attempted: Nothing.  Symptoms are: stable.  Triage Disposition: Home Care  Patient/caregiver understands and will follow disposition?: Yes  Reason for Disposition  Cough with cold symptoms (e.g., runny nose, postnasal drip, throat clearing)  Answer Assessment - Initial Assessment Questions Pt given methylPREDNISolone  (MEDROL  DOSEPAK) 4 MG for sinus infection, patient is experiencing a cough from post nasal drip, especially at night. She is requesting cough medicine be sent to [facility] [address]. [place] [phone]. Pt requesting call back [phone]  1. ONSET: When did the cough begin?      3-4 days ago  2. SPUTUM: Describe the color of your sputum (e.g., none, dry cough; clear, white, yellow, green)     Clear  3. HEMOPTYSIS: Are you coughing up any blood? If Yes, ask: How much? (e.g., flecks, streaks, tablespoons, etc.)     Denies  4. DIFFICULTY BREATHING: Are you having difficulty breathing? If Yes, ask: How bad is it? (e.g., mild, moderate, severe)      Denies  5. FEVER: Do you have a fever? If Yes, ask: What is your temperature, how was it measured, and when did it start?     Denies  6. LUNG HISTORY: Do you have any history of lung disease?  (e.g., pulmonary embolus, asthma, emphysema)     Denies  7. OTHER SYMPTOMS: Do you have any other symptoms? (e.g., runny nose, wheezing, chest pain)       Denies  Protocols used: Cough - Acute Productive-A-AH  Message from Alexandria E sent at 03/24/2024  8:13 AM EST  Summary: Cough   Reason for Triage: Worsening cough while patient is on antibiotics. Patient stated she cannot sleep at night and is coughing up clear phlegm.

## 2024-03-26 ENCOUNTER — Ambulatory Visit: Payer: Self-pay | Admitting: Student in an Organized Health Care Education/Training Program

## 2024-03-28 NOTE — Progress Notes (Signed)
 Remote ICD Transmission

## 2024-03-29 NOTE — Telephone Encounter (Signed)
 Pt.notified

## 2024-04-06 NOTE — Progress Notes (Signed)
 31 day ICM Remote transmission canceled due to [facility] clinic is on hold until further notice.  91 day remote monitoring will continue per protocol.

## 2024-04-07 LAB — BASIC METABOLIC PANEL WITH GFR
BUN/Creatinine Ratio: 10 — ABNORMAL LOW (ref 12–28)
BUN: 9 mg/dL (ref 8–27)
CO2: 24 mmol/L (ref 20–29)
Calcium: 9.5 mg/dL (ref 8.7–10.3)
Chloride: 102 mmol/L (ref 96–106)
Creatinine, Ser: 0.91 mg/dL (ref 0.57–1.00)
Glucose: 97 mg/dL (ref 70–99)
Potassium: 4.6 mmol/L (ref 3.5–5.2)
Sodium: 141 mmol/L (ref 134–144)
eGFR: 70 mL/min/{1.73_m2}

## 2024-04-09 ENCOUNTER — Ambulatory Visit: Payer: Self-pay | Admitting: Cardiology

## 2024-04-10 ENCOUNTER — Ambulatory Visit
